# Patient Record
Sex: Female | Born: 1965
Health system: Southern US, Community
[De-identification: ages and names within clinical notes are randomized; demographics above are authoritative.]

## PROBLEM LIST (undated history)

## (undated) DIAGNOSIS — Z8719 Personal history of other diseases of the digestive system: Secondary | ICD-10-CM

## (undated) DIAGNOSIS — R42 Dizziness and giddiness: Secondary | ICD-10-CM

## (undated) DIAGNOSIS — K219 Gastro-esophageal reflux disease without esophagitis: Secondary | ICD-10-CM

## (undated) DIAGNOSIS — F410 Panic disorder [episodic paroxysmal anxiety] without agoraphobia: Secondary | ICD-10-CM

## (undated) DIAGNOSIS — M542 Cervicalgia: Secondary | ICD-10-CM

## (undated) DIAGNOSIS — G43909 Migraine, unspecified, not intractable, without status migrainosus: Secondary | ICD-10-CM

## (undated) DIAGNOSIS — K635 Polyp of colon: Secondary | ICD-10-CM

## (undated) HISTORY — PX: SHOULDER SURGERY: SHX246

## (undated) HISTORY — DX: Migraine, unspecified, not intractable, without status migrainosus: G43.909

## (undated) HISTORY — DX: Polyp of colon: K63.5

---

## 2000-04-07 ENCOUNTER — Other Ambulatory Visit: Admission: RE | Admit: 2000-04-07 | Discharge: 2000-04-07 | Payer: Self-pay | Admitting: Obstetrics and Gynecology

## 2001-05-25 ENCOUNTER — Other Ambulatory Visit: Admission: RE | Admit: 2001-05-25 | Discharge: 2001-05-25 | Payer: Self-pay | Admitting: Obstetrics and Gynecology

## 2011-12-09 HISTORY — PX: ABLATION ON ENDOMETRIOSIS: SHX5787

## 2016-06-20 ENCOUNTER — Ambulatory Visit: Payer: Self-pay | Admitting: Family Medicine

## 2016-06-20 ENCOUNTER — Ambulatory Visit (INDEPENDENT_AMBULATORY_CARE_PROVIDER_SITE_OTHER): Payer: Self-pay | Admitting: Family Medicine

## 2016-06-20 ENCOUNTER — Encounter: Payer: Self-pay | Admitting: Family Medicine

## 2016-06-20 VITALS — BP 140/90 | HR 80 | Ht 68.0 in | Wt 208.0 lb

## 2016-06-20 DIAGNOSIS — R030 Elevated blood-pressure reading, without diagnosis of hypertension: Secondary | ICD-10-CM

## 2016-06-20 DIAGNOSIS — J0100 Acute maxillary sinusitis, unspecified: Secondary | ICD-10-CM

## 2016-06-20 DIAGNOSIS — Z7689 Persons encountering health services in other specified circumstances: Secondary | ICD-10-CM

## 2016-06-20 DIAGNOSIS — Z7189 Other specified counseling: Secondary | ICD-10-CM

## 2016-06-20 DIAGNOSIS — G43109 Migraine with aura, not intractable, without status migrainosus: Secondary | ICD-10-CM

## 2016-06-20 MED ORDER — HYDROCHLOROTHIAZIDE 12.5 MG PO TABS: 12.5000 mg | ORAL_TABLET | Freq: Every day | ORAL | Status: DC

## 2016-06-20 MED ORDER — DOXYCYCLINE HYCLATE 100 MG PO TABS: 100.0000 mg | ORAL_TABLET | Freq: Two times a day (BID) | ORAL | Status: DC

## 2016-06-20 MED ORDER — TOPIRAMATE 50 MG PO TABS: 50.0000 mg | ORAL_TABLET | Freq: Every day | ORAL | Status: DC

## 2016-06-20 NOTE — Progress Notes (Signed)
Name: Yolanda Young   MRN: [id number]    DOB: Sep 27, 1966   Date:06/20/2016       Progress Note  Subjective  Chief Complaint  Chief Complaint  Patient presents with  . Establish Care    moved here from [place]  . Headache    averaging 2 headaches per week, last headache was about a week ago, until this one. Hurts above L) eye and into teeth. L) ear is popping- has a Hx of migraines/ use to take Topiramate 50mg  daily    Headache  This is a recurrent problem. The current episode started 1 to 4 weeks ago. The problem occurs intermittently. The problem has been waxing and waning. The pain is located in the left unilateral and retro-orbital region. The quality of the pain is described as aching. The pain is at a severity of 6/10. The pain is moderate. Associated symptoms include blurred vision, nausea, phonophobia, photophobia and sinus pressure. Pertinent negatives include no abdominal pain, back pain, coughing, dizziness, ear pain, fever, insomnia, loss of balance, neck pain, numbness, sore throat, tingling, visual change, weakness or weight loss. The symptoms are aggravated by weather changes. She has tried acetaminophen for the symptoms. Her past medical history is significant for migraine headaches.    No problem-specific assessment & plan notes found for this encounter.   Past Medical History  Diagnosis Date  . Migraine     Past Surgical History  Procedure Laterality Date  . Ablation on endometriosis    . Shoulder surgery Right     Family History  Problem Relation Age of Onset  . Diabetes Mother   . Stroke Father   . Cancer Maternal Grandmother   . Cancer Maternal Grandfather   . Diabetes Paternal Grandmother     Social History   Social History  . Marital Status: Legally Separated    Spouse Name: N/A  . Number of Children: N/A  . Years of Education: N/A   Occupational History  . Not on file.   Social History Main Topics  . Smoking status: Former Research scientist (life sciences)  .  Smokeless tobacco: Not on file  . Alcohol Use: 0.0 oz/week    0 Standard drinks or equivalent per week  . Drug Use: No  . Sexual Activity: Yes   Other Topics Concern  . Not on file   Social History Narrative  . No narrative on file    Allergies  Allergen Reactions  . Erythromycin   . Penicillins      Review of Systems  Constitutional: Negative for fever, chills, weight loss and malaise/fatigue.  HENT: Positive for sinus pressure. Negative for ear discharge, ear pain and sore throat.   Eyes: Positive for blurred vision and photophobia.  Respiratory: Negative for cough, sputum production, shortness of breath and wheezing.   Cardiovascular: Negative for chest pain, palpitations and leg swelling.  Gastrointestinal: Positive for nausea. Negative for heartburn, abdominal pain, diarrhea, constipation, blood in stool and melena.  Genitourinary: Negative for dysuria, urgency, frequency and hematuria.  Musculoskeletal: Negative for myalgias, back pain, joint pain and neck pain.  Skin: Negative for rash.  Neurological: Positive for headaches. Negative for dizziness, tingling, sensory change, focal weakness, weakness, numbness and loss of balance.  Endo/Heme/Allergies: Negative for environmental allergies and polydipsia. Does not bruise/bleed easily.  Psychiatric/Behavioral: Negative for depression and suicidal ideas. The patient is not nervous/anxious and does not have insomnia.      Objective  Filed Vitals:   06/20/16 1358  BP: 140/90  Pulse: 80  Height: 5\' 8"  (1.727 m)  Weight: 208 lb (94.348 kg)    Physical Exam  Constitutional: She is well-developed, well-nourished, and in no distress. No distress.  HENT:  Head: Normocephalic and atraumatic.  Right Ear: External ear normal.  Left Ear: External ear normal.  Nose: Nose normal.  Mouth/Throat: Oropharynx is clear and moist.  Eyes: Conjunctivae and EOM are normal. Pupils are equal, round, and reactive to light. Right eye  exhibits no discharge. Left eye exhibits no discharge.  Neck: Normal range of motion. Neck supple. No JVD present. No thyromegaly present.  Cardiovascular: Normal rate, regular rhythm, normal heart sounds and intact distal pulses.  Exam reveals no gallop and no friction rub.   No murmur heard. Pulmonary/Chest: Effort normal and breath sounds normal.  Abdominal: Soft. Bowel sounds are normal. She exhibits no mass. There is no tenderness. There is no guarding.  Musculoskeletal: Normal range of motion. She exhibits no edema.  Lymphadenopathy:    She has no cervical adenopathy.  Neurological: She is alert. She has normal sensation, normal strength, normal reflexes and intact cranial nerves.  Skin: Skin is warm and dry. She is not diaphoretic.  Psychiatric: Mood and affect normal.  Nursing note and vitals reviewed.     Assessment & Plan  Problem List Items Addressed This Visit    None    Visit Diagnoses    Encounter to establish care with new doctor    -  Primary    Elevated blood pressure (not hypertension)        Relevant Medications    hydrochlorothiazide (HYDRODIURIL) 12.5 MG tablet    Migraine with aura and without status migrainosus, not intractable        Relevant Medications    hydrochlorothiazide (HYDRODIURIL) 12.5 MG tablet    topiramate (TOPAMAX) 50 MG tablet    Acute maxillary sinusitis, recurrence not specified        Relevant Medications    doxycycline (VIBRA-TABS) 100 MG tablet         Dr. Flor Whitacre [facility]  06/20/2016

## 2016-11-13 ENCOUNTER — Ambulatory Visit (INDEPENDENT_AMBULATORY_CARE_PROVIDER_SITE_OTHER): Payer: Self-pay | Admitting: Family Medicine

## 2016-11-13 VITALS — BP 132/92 | HR 88 | Temp 97.6°F | Ht 68.0 in | Wt 213.0 lb

## 2016-11-13 DIAGNOSIS — J40 Bronchitis, not specified as acute or chronic: Secondary | ICD-10-CM

## 2016-11-13 DIAGNOSIS — J0100 Acute maxillary sinusitis, unspecified: Secondary | ICD-10-CM

## 2016-11-13 MED ORDER — GUAIFENESIN-CODEINE 100-10 MG/5ML PO SYRP: 5.0000 mL | ORAL_SOLUTION | Freq: Three times a day (TID) | ORAL | 0 refills | Status: DC | PRN

## 2016-11-13 MED ORDER — DOXYCYCLINE HYCLATE 100 MG PO TABS: 100.0000 mg | ORAL_TABLET | Freq: Two times a day (BID) | ORAL | 0 refills | Status: DC

## 2016-11-13 NOTE — Progress Notes (Signed)
Name: Yolanda Young   MRN: [id number]    DOB: October 31, 1966   Date:11/13/2016       Progress Note  Subjective  Chief Complaint  Chief Complaint  Patient presents with  . Cough    Pt stated having dry cough, sinus pressure for about 2 weeks.    Cough  This is a new problem. The current episode started 1 to 4 weeks ago. The problem has been waxing and waning. The problem occurs hourly. The cough is non-productive. Associated symptoms include chills, ear congestion, headaches, postnasal drip and shortness of breath. Pertinent negatives include no chest pain, ear pain, fever, heartburn, hemoptysis, myalgias, nasal congestion, rash, rhinorrhea, sore throat, sweats, weight loss or wheezing. Nothing aggravates the symptoms. She has tried OTC cough suppressant for the symptoms. The treatment provided mild relief. There is no history of asthma, bronchiectasis, COPD, emphysema, environmental allergies or pneumonia.  Sinusitis  This is a new problem. The current episode started 1 to 4 weeks ago. The problem has been gradually worsening since onset. There has been no fever. The pain is mild. Associated symptoms include chills, coughing, headaches, shortness of breath and sinus pressure. Pertinent negatives include no ear pain, neck pain or sore throat.    No problem-specific Assessment & Plan notes found for this encounter.   Past Medical History:  Diagnosis Date  . Migraine     Past Surgical History:  Procedure Laterality Date  . ABLATION ON ENDOMETRIOSIS    . SHOULDER SURGERY Right     Family History  Problem Relation Age of Onset  . Diabetes Mother   . Stroke Father   . Cancer Maternal Grandmother   . Cancer Maternal Grandfather   . Diabetes Paternal Grandmother     Social History   Social History  . Marital status: Legally Separated    Spouse name: N/A  . Number of children: N/A  . Years of education: N/A   Occupational History  . Not on file.   Social History Main Topics  .  Smoking status: Former Research scientist (life sciences)  . Smokeless tobacco: Not on file  . Alcohol use 0.0 oz/week  . Drug use: No  . Sexual activity: Yes   Other Topics Concern  . Not on file   Social History Narrative  . No narrative on file    Allergies  Allergen Reactions  . Erythromycin   . Penicillins      Review of Systems  Constitutional: Positive for chills. Negative for fever, malaise/fatigue and weight loss.  HENT: Positive for postnasal drip and sinus pressure. Negative for ear discharge, ear pain, rhinorrhea and sore throat.   Eyes: Negative for blurred vision.  Respiratory: Positive for cough and shortness of breath. Negative for hemoptysis, sputum production and wheezing.   Cardiovascular: Negative for chest pain, palpitations and leg swelling.  Gastrointestinal: Negative for abdominal pain, blood in stool, constipation, diarrhea, heartburn, melena and nausea.  Genitourinary: Negative for dysuria, frequency, hematuria and urgency.  Musculoskeletal: Negative for back pain, joint pain, myalgias and neck pain.  Skin: Negative for rash.  Neurological: Positive for headaches. Negative for dizziness, tingling, sensory change and focal weakness.  Endo/Heme/Allergies: Negative for environmental allergies and polydipsia. Does not bruise/bleed easily.  Psychiatric/Behavioral: Negative for depression and suicidal ideas. The patient is not nervous/anxious and does not have insomnia.      Objective  Vitals:   11/13/16 0807  BP: (!) 132/92  Pulse: 88  Temp: 97.6 F (36.4 C)  SpO2: 96%  Weight: 213 lb (  96.6 kg)  Height: 5\' 8"  (1.727 m)    Physical Exam  Constitutional: She is well-developed, well-nourished, and in no distress. No distress.  HENT:  Head: Normocephalic and atraumatic.  Right Ear: Tympanic membrane, external ear and ear canal normal.  Left Ear: Tympanic membrane, external ear and ear canal normal.  Nose: Nose normal.  Mouth/Throat: Oropharynx is clear and moist.  Eyes:  Conjunctivae and EOM are normal. Pupils are equal, round, and reactive to light. Right eye exhibits no discharge. Left eye exhibits no discharge.  Neck: Normal range of motion. Neck supple. No JVD present. No thyromegaly present.  Cardiovascular: Normal rate, regular rhythm, normal heart sounds and intact distal pulses.  Exam reveals no gallop and no friction rub.   No murmur heard. Pulmonary/Chest: Effort normal and breath sounds normal. She has no wheezes. She has no rales.  Abdominal: Soft. Bowel sounds are normal. She exhibits no mass. There is no tenderness. There is no guarding.  Musculoskeletal: Normal range of motion. She exhibits no edema.  Lymphadenopathy:    She has no cervical adenopathy.  Neurological: She is alert. She has normal reflexes.  Skin: Skin is warm and dry. No rash noted. She is not diaphoretic.  Psychiatric: Mood and affect normal.  Nursing note and vitals reviewed.     Assessment & Plan  Problem List Items Addressed This Visit    None    Visit Diagnoses    Bronchitis    -  Primary   Relevant Medications   doxycycline (VIBRA-TABS) 100 MG tablet   guaiFENesin-codeine (ROBITUSSIN AC) 100-10 MG/5ML syrup   Acute maxillary sinusitis, recurrence not specified       Relevant Medications   doxycycline (VIBRA-TABS) 100 MG tablet   guaiFENesin-codeine (ROBITUSSIN AC) 100-10 MG/5ML syrup        Dr. Charolette Bultman [facility]  11/13/16

## 2016-12-23 ENCOUNTER — Ambulatory Visit (INDEPENDENT_AMBULATORY_CARE_PROVIDER_SITE_OTHER): Payer: BLUE CROSS/BLUE SHIELD | Admitting: Family Medicine

## 2016-12-23 ENCOUNTER — Encounter: Payer: Self-pay | Admitting: Family Medicine

## 2016-12-23 VITALS — BP 110/80 | HR 70 | Ht 68.0 in | Wt 215.0 lb

## 2016-12-23 DIAGNOSIS — R0789 Other chest pain: Secondary | ICD-10-CM | POA: Diagnosis not present

## 2016-12-23 DIAGNOSIS — Z1231 Encounter for screening mammogram for malignant neoplasm of breast: Secondary | ICD-10-CM | POA: Diagnosis not present

## 2016-12-23 DIAGNOSIS — Z1239 Encounter for other screening for malignant neoplasm of breast: Secondary | ICD-10-CM

## 2016-12-23 MED ORDER — PREDNISONE 10 MG PO TABS: 10.0000 mg | ORAL_TABLET | Freq: Every day | ORAL | 0 refills | Status: DC

## 2016-12-23 MED ORDER — TRAMADOL HCL 50 MG PO TABS: 50.0000 mg | ORAL_TABLET | Freq: Three times a day (TID) | ORAL | 0 refills | Status: DC | PRN

## 2016-12-23 MED ORDER — VALACYCLOVIR HCL 1 G PO TABS: 1000.0000 mg | ORAL_TABLET | Freq: Two times a day (BID) | ORAL | 0 refills | Status: DC

## 2016-12-23 NOTE — Progress Notes (Signed)
Name: Yolanda Young   MRN: [id number]    DOB: 1966/10/16   Date:12/23/2016       Progress Note  Subjective  Chief Complaint  Chief Complaint  Patient presents with  . Breast Pain    R) breast pain- feels like a sunburn/ redness on outside of R) breast- came up yesterday. Noticed a "pimlpe" on R) nipple x 2 months ago- clear liquid came out    Right breast 2 months ago "pimple " arose on nipple erythematous,swelling, and tender/ clear discharge with resolution.  No blood or brown/  Onset of "burning" of right lateral breast and redness.    No problem-specific Assessment & Plan notes found for this encounter.   Past Medical History:  Diagnosis Date  . Migraine     Past Surgical History:  Procedure Laterality Date  . ABLATION ON ENDOMETRIOSIS    . SHOULDER SURGERY Right     Family History  Problem Relation Age of Onset  . Diabetes Mother   . Stroke Father   . Cancer Maternal Grandmother   . Cancer Maternal Grandfather   . Diabetes Paternal Grandmother     Social History   Social History  . Marital status: Legally Separated    Spouse name: N/A  . Number of children: N/A  . Years of education: N/A   Occupational History  . Not on file.   Social History Main Topics  . Smoking status: Former Research scientist (life sciences)  . Smokeless tobacco: Not on file  . Alcohol use 0.0 oz/week  . Drug use: No  . Sexual activity: Yes   Other Topics Concern  . Not on file   Social History Narrative  . No narrative on file    Allergies  Allergen Reactions  . Erythromycin   . Penicillins      Review of Systems  Constitutional: Negative for chills, fever, malaise/fatigue and weight loss.  HENT: Negative for ear discharge, ear pain and sore throat.   Eyes: Negative for blurred vision.  Respiratory: Negative for cough, sputum production, shortness of breath and wheezing.   Cardiovascular: Negative for chest pain, palpitations and leg swelling.  Gastrointestinal: Negative for abdominal pain,  blood in stool, constipation, diarrhea, heartburn, melena and nausea.  Genitourinary: Negative for dysuria, frequency, hematuria and urgency.  Musculoskeletal: Negative for back pain, joint pain, myalgias and neck pain.  Skin: Negative for rash.  Neurological: Negative for dizziness, tingling, sensory change, focal weakness and headaches.  Endo/Heme/Allergies: Negative for environmental allergies and polydipsia. Does not bruise/bleed easily.  Psychiatric/Behavioral: Negative for depression and suicidal ideas. The patient is not nervous/anxious and does not have insomnia.      Objective  Vitals:   12/23/16 1115  BP: 110/80  Pulse: 70  Weight: 215 lb (97.5 kg)  Height: 5\' 8"  (1.727 m)    Physical Exam  Constitutional: She is well-developed, well-nourished, and in no distress. No distress.  HENT:  Head: Normocephalic and atraumatic.  Right Ear: External ear normal.  Left Ear: External ear normal.  Nose: Nose normal.  Mouth/Throat: Oropharynx is clear and moist.  Eyes: Conjunctivae and EOM are normal. Pupils are equal, round, and reactive to light. Right eye exhibits no discharge. Left eye exhibits no discharge.  Neck: Normal range of motion. Neck supple. No JVD present. No thyromegaly present.  Cardiovascular: Normal rate, regular rhythm, normal heart sounds and intact distal pulses.  Exam reveals no gallop and no friction rub.   No murmur heard. Pulmonary/Chest: Effort normal and breath sounds normal. She  has no wheezes. She has no rales.  Abdominal: Soft. Bowel sounds are normal. She exhibits no mass. There is no tenderness. There is no guarding.  Musculoskeletal: Normal range of motion. She exhibits no edema.  Lymphadenopathy:    She has no cervical adenopathy.  Neurological: She is alert.  Skin: Skin is warm and dry. She is not diaphoretic.  Psychiatric: Mood and affect normal.      Assessment & Plan  Problem List Items Addressed This Visit    None    Visit Diagnoses     Encounter for screening breast examination    -  Primary   Relevant Orders   MM Digital Screening   Chest wall discomfort       Relevant Medications   valACYclovir (VALTREX) 1000 MG tablet   traMADol (ULTRAM) 50 MG tablet   predniSONE (DELTASONE) 10 MG tablet        Dr. Jidenna Figgs [facility]  12/23/16

## 2016-12-30 ENCOUNTER — Other Ambulatory Visit: Payer: Self-pay | Admitting: *Deleted

## 2016-12-30 ENCOUNTER — Inpatient Hospital Stay
Admission: RE | Admit: 2016-12-30 | Discharge: 2016-12-30 | Disposition: A | Discharge status: Home / Self Care | Payer: Self-pay | Source: Ambulatory Visit | Attending: *Deleted | Admitting: *Deleted

## 2016-12-30 DIAGNOSIS — Z9289 Personal history of other medical treatment: Secondary | ICD-10-CM

## 2017-01-21 ENCOUNTER — Ambulatory Visit
Admission: RE | Admit: 2017-01-21 | Discharge: 2017-01-21 | Disposition: A | Discharge status: Home / Self Care | Payer: BLUE CROSS/BLUE SHIELD | Source: Ambulatory Visit | Attending: Family Medicine | Admitting: Family Medicine

## 2017-01-21 DIAGNOSIS — Z1231 Encounter for screening mammogram for malignant neoplasm of breast: Secondary | ICD-10-CM | POA: Diagnosis not present

## 2017-01-21 DIAGNOSIS — Z1239 Encounter for other screening for malignant neoplasm of breast: Secondary | ICD-10-CM

## 2017-04-10 ENCOUNTER — Encounter: Payer: Self-pay | Admitting: Family Medicine

## 2017-04-10 ENCOUNTER — Ambulatory Visit (INDEPENDENT_AMBULATORY_CARE_PROVIDER_SITE_OTHER): Payer: BLUE CROSS/BLUE SHIELD | Admitting: Family Medicine

## 2017-04-10 VITALS — BP 120/80 | HR 64 | Ht 68.0 in | Wt 208.0 lb

## 2017-04-10 DIAGNOSIS — Z0289 Encounter for other administrative examinations: Secondary | ICD-10-CM

## 2017-04-10 DIAGNOSIS — Z021 Encounter for pre-employment examination: Secondary | ICD-10-CM | POA: Diagnosis not present

## 2017-04-10 DIAGNOSIS — J0100 Acute maxillary sinusitis, unspecified: Secondary | ICD-10-CM

## 2017-04-10 DIAGNOSIS — Z111 Encounter for screening for respiratory tuberculosis: Secondary | ICD-10-CM

## 2017-04-10 MED ORDER — DOXYCYCLINE HYCLATE 100 MG PO TABS: 100.0000 mg | ORAL_TABLET | Freq: Two times a day (BID) | ORAL | 0 refills | Status: DC

## 2017-04-10 NOTE — Progress Notes (Signed)
Name: Yolanda Young   MRN: [id number]    DOB: 31-Dec-1965   Date:04/10/2017       Progress Note  Subjective  Chief Complaint  Chief Complaint  Patient presents with  . Annual Exam    will come on Mon for TB skin test    Patient presents for school employment physical.   Sinusitis  This is a new problem. The current episode started in the past 7 days. The problem has been gradually worsening since onset. There has been no fever. The pain is mild. Associated symptoms include chills, congestion, coughing, diaphoresis, ear pain, headaches, sinus pressure, sneezing, a sore throat and swollen glands. Pertinent negatives include no hoarse voice, neck pain or shortness of breath. Past treatments include oral decongestants. The treatment provided moderate relief.    No problem-specific Assessment & Plan notes found for this encounter.   Past Medical History:  Diagnosis Date  . Migraine     Past Surgical History:  Procedure Laterality Date  . ABLATION ON ENDOMETRIOSIS    . SHOULDER SURGERY Right     Family History  Problem Relation Age of Onset  . Diabetes Mother   . Stroke Father   . Cancer Maternal Grandmother   . Cancer Maternal Grandfather   . Diabetes Paternal Grandmother     Social History   Social History  . Marital status: Divorced    Spouse name: N/A  . Number of children: N/A  . Years of education: N/A   Occupational History  . Not on file.   Social History Main Topics  . Smoking status: Former Research scientist (life sciences)  . Smokeless tobacco: Never Used  . Alcohol use 0.0 oz/week  . Drug use: No  . Sexual activity: Yes   Other Topics Concern  . Not on file   Social History Narrative  . No narrative on file    Allergies  Allergen Reactions  . Erythromycin   . Penicillins     Outpatient Medications Prior to Visit  Medication Sig Dispense Refill  . topiramate (TOPAMAX) 50 MG tablet Take 1 tablet (50 mg total) by mouth daily. 30 tablet 6  . traMADol (ULTRAM) 50 MG tablet  Take 1 tablet (50 mg total) by mouth every 8 (eight) hours as needed. 30 tablet 0  . predniSONE (DELTASONE) 10 MG tablet Take 1 tablet (10 mg total) by mouth daily with breakfast. 30 tablet 0  . valACYclovir (VALTREX) 1000 MG tablet Take 1 tablet (1,000 mg total) by mouth 2 (two) times daily. 20 tablet 0   No facility-administered medications prior to visit.     Review of Systems  Constitutional: Positive for chills and diaphoresis. Negative for fever, malaise/fatigue and weight loss.  HENT: Positive for congestion, ear pain, sinus pressure, sneezing and sore throat. Negative for ear discharge and hoarse voice.   Eyes: Negative for blurred vision.  Respiratory: Positive for cough. Negative for sputum production, shortness of breath and wheezing.   Cardiovascular: Negative for chest pain, palpitations and leg swelling.  Gastrointestinal: Negative for abdominal pain, blood in stool, constipation, diarrhea, heartburn, melena and nausea.  Genitourinary: Negative for dysuria, frequency, hematuria and urgency.  Musculoskeletal: Negative for back pain, joint pain, myalgias and neck pain.  Skin: Negative for rash.  Neurological: Positive for headaches. Negative for dizziness, tingling, sensory change and focal weakness.  Endo/Heme/Allergies: Negative for environmental allergies and polydipsia. Does not bruise/bleed easily.  Psychiatric/Behavioral: Negative for depression and suicidal ideas. The patient is not nervous/anxious and does not have insomnia.  Objective  Vitals:   04/10/17 0946  BP: 120/80  Pulse: 64  Weight: 208 lb (94.3 kg)  Height: 5\' 8"  (1.727 m)    Physical Exam  Constitutional: She is well-developed, well-nourished, and in no distress. No distress.  HENT:  Head: Normocephalic and atraumatic.  Right Ear: External ear normal. Tympanic membrane is retracted.  Left Ear: External ear normal. Tympanic membrane is retracted.  Nose: Right sinus exhibits maxillary sinus  tenderness. Left sinus exhibits maxillary sinus tenderness.  Mouth/Throat: Posterior oropharyngeal erythema present.  Eyes: Conjunctivae and EOM are normal. Pupils are equal, round, and reactive to light. Right eye exhibits no discharge. Left eye exhibits no discharge.  Neck: Normal range of motion. Neck supple. No JVD present. No thyromegaly present.  Cardiovascular: Normal rate, regular rhythm, normal heart sounds and intact distal pulses.  Exam reveals no gallop and no friction rub.   No murmur heard. Pulmonary/Chest: Effort normal and breath sounds normal. She has no wheezes.  Abdominal: Soft. Bowel sounds are normal. She exhibits no mass. There is no tenderness. There is no guarding.  Musculoskeletal: Normal range of motion. She exhibits no edema.  Lymphadenopathy:    She has no cervical adenopathy.  Neurological: She is alert. She has normal reflexes.  Skin: Skin is warm and dry. She is not diaphoretic.  Psychiatric: Mood and affect normal.  Nursing note and vitals reviewed.     Assessment & Plan  Problem List Items Addressed This Visit    None    Visit Diagnoses    Encounter for physical examination related to employment    -  Primary   Relevant Orders   PPD (Completed)   Acute maxillary sinusitis, recurrence not specified       Relevant Medications   doxycycline (VIBRA-TABS) 100 MG tablet   Screening-pulmonary TB       Relevant Orders   PPD (Completed)      Meds ordered this encounter  Medications  . doxycycline (VIBRA-TABS) 100 MG tablet    Sig: Take 1 tablet (100 mg total) by mouth 2 (two) times daily.    Dispense:  20 tablet    Refill:  0      Dr. Dalan Cowger [facility]  04/10/17

## 2017-04-13 LAB — TB SKIN TEST
INDURATION: 0 mm
TB SKIN TEST: NEGATIVE

## 2017-06-22 ENCOUNTER — Other Ambulatory Visit: Payer: Self-pay

## 2017-06-22 DIAGNOSIS — G43109 Migraine with aura, not intractable, without status migrainosus: Secondary | ICD-10-CM

## 2017-06-22 MED ORDER — TOPIRAMATE 50 MG PO TABS
50.0000 mg | ORAL_TABLET | Freq: Every day | ORAL | 2 refills | Status: DC
Start: 2017-06-22 — End: 2017-09-18

## 2017-09-18 ENCOUNTER — Ambulatory Visit
Admission: RE | Admit: 2017-09-18 | Discharge: 2017-09-18 | Disposition: A | Discharge status: Home / Self Care | Payer: BLUE CROSS/BLUE SHIELD | Source: Ambulatory Visit | Attending: Family Medicine | Admitting: Family Medicine

## 2017-09-18 ENCOUNTER — Ambulatory Visit (INDEPENDENT_AMBULATORY_CARE_PROVIDER_SITE_OTHER): Payer: BLUE CROSS/BLUE SHIELD | Admitting: Family Medicine

## 2017-09-18 ENCOUNTER — Encounter: Payer: Self-pay | Admitting: Family Medicine

## 2017-09-18 VITALS — BP 120/80 | HR 68 | Ht 68.0 in | Wt 218.0 lb

## 2017-09-18 DIAGNOSIS — R319 Hematuria, unspecified: Secondary | ICD-10-CM

## 2017-09-18 DIAGNOSIS — R109 Unspecified abdominal pain: Secondary | ICD-10-CM | POA: Diagnosis not present

## 2017-09-18 DIAGNOSIS — M5126 Other intervertebral disc displacement, lumbar region: Secondary | ICD-10-CM | POA: Diagnosis not present

## 2017-09-18 DIAGNOSIS — M790 Rheumatism, unspecified: Secondary | ICD-10-CM

## 2017-09-18 DIAGNOSIS — R1032 Left lower quadrant pain: Secondary | ICD-10-CM | POA: Diagnosis not present

## 2017-09-18 DIAGNOSIS — G43109 Migraine with aura, not intractable, without status migrainosus: Secondary | ICD-10-CM

## 2017-09-18 DIAGNOSIS — W19XXXA Unspecified fall, initial encounter: Secondary | ICD-10-CM | POA: Insufficient documentation

## 2017-09-18 LAB — POCT URINALYSIS DIPSTICK
BILIRUBIN UA: NEGATIVE
GLUCOSE UA: NEGATIVE
Ketones, UA: NEGATIVE
Leukocytes, UA: NEGATIVE
Nitrite, UA: NEGATIVE
Protein, UA: NEGATIVE
SPEC GRAV UA: 1.01 (ref 1.010–1.025)
UROBILINOGEN UA: 0.2 U/dL
pH, UA: 6 (ref 5.0–8.0)

## 2017-09-18 MED ORDER — PREDNISONE 10 MG PO TABS: 10.0000 mg | ORAL_TABLET | Freq: Every day | ORAL | 0 refills | Status: DC

## 2017-09-18 MED ORDER — CYCLOBENZAPRINE HCL 10 MG PO TABS: 10.0000 mg | ORAL_TABLET | Freq: Three times a day (TID) | ORAL | 0 refills | Status: DC | PRN

## 2017-09-18 MED ORDER — TOPIRAMATE 50 MG PO TABS: 50.0000 mg | ORAL_TABLET | Freq: Every day | ORAL | 2 refills | Status: DC

## 2017-09-18 MED ORDER — TRAMADOL HCL 50 MG PO TABS: 50.0000 mg | ORAL_TABLET | Freq: Four times a day (QID) | ORAL | 0 refills | Status: DC | PRN

## 2017-09-18 NOTE — Progress Notes (Signed)
Name: Yolanda Young   MRN: [id number]    DOB: 1966/04/07   Date:09/18/2017       Progress Note  Subjective  Chief Complaint  Chief Complaint  Patient presents with  . Back Pain    fell x 1 week ago- pain is "excrusiating, like contractions that radiates around to my groin"- some blood in urine.     Back Pain  This is a new problem. The current episode started in the past 7 days. The problem occurs constantly. The problem has been gradually worsening since onset. The pain is present in the lumbar spine. The quality of the pain is described as aching. Radiates to: suprapubic. The pain is at a severity of 10/10. The pain is severe. The pain is the same all the time. The symptoms are aggravated by bending, twisting and sitting. Pertinent negatives include no abdominal pain, bladder incontinence, bowel incontinence, chest pain, dysuria, fever, headaches, leg pain, numbness, paresis, paresthesias, tingling, weakness or weight loss. Risk factors: hx of stones. The treatment provided moderate relief.  Fall  The accident occurred more than 1 week ago. The fall occurred while standing. She landed on hard floor. The point of impact was the buttocks. The pain is present in the back. The pain is at a severity of 10/10. The pain is severe. The symptoms are aggravated by ambulation, rotation, sitting and movement. Pertinent negatives include no abdominal pain, bowel incontinence, fever, headaches, hematuria, nausea, numbness or tingling. She has tried acetaminophen (tramadol) for the symptoms.    No problem-specific Assessment & Plan notes found for this encounter.   Past Medical History:  Diagnosis Date  . Migraine     Past Surgical History:  Procedure Laterality Date  . ABLATION ON ENDOMETRIOSIS    . SHOULDER SURGERY Right     Family History  Problem Relation Age of Onset  . Diabetes Mother   . Stroke Father   . Cancer Maternal Grandmother   . Cancer Maternal Grandfather   . Diabetes Paternal  Grandmother     Social History   Social History  . Marital status: Divorced    Spouse name: N/A  . Number of children: N/A  . Years of education: N/A   Occupational History  . Not on file.   Social History Main Topics  . Smoking status: Former Research scientist (life sciences)  . Smokeless tobacco: Never Used  . Alcohol use 0.0 oz/week  . Drug use: No  . Sexual activity: Yes   Other Topics Concern  . Not on file   Social History Narrative  . No narrative on file    Allergies  Allergen Reactions  . Erythromycin   . Penicillins     Outpatient Medications Prior to Visit  Medication Sig Dispense Refill  . traMADol (ULTRAM) 50 MG tablet Take 1 tablet (50 mg total) by mouth every 8 (eight) hours as needed. 30 tablet 0  . doxycycline (VIBRA-TABS) 100 MG tablet Take 1 tablet (100 mg total) by mouth 2 (two) times daily. 20 tablet 0  . topiramate (TOPAMAX) 50 MG tablet Take 1 tablet (50 mg total) by mouth daily. (Patient not taking: Reported on 09/18/2017) 30 tablet 2   No facility-administered medications prior to visit.     Review of Systems  Constitutional: Negative for chills, fever, malaise/fatigue and weight loss.  HENT: Negative for ear discharge, ear pain and sore throat.   Eyes: Negative for blurred vision.  Respiratory: Negative for cough, sputum production, shortness of breath and wheezing.   Cardiovascular:  Negative for chest pain, palpitations and leg swelling.  Gastrointestinal: Negative for abdominal pain, blood in stool, bowel incontinence, constipation, diarrhea, heartburn, melena and nausea.  Genitourinary: Negative for bladder incontinence, dysuria, frequency, hematuria and urgency.  Musculoskeletal: Positive for back pain. Negative for joint pain, myalgias and neck pain.  Skin: Negative for rash.  Neurological: Negative for dizziness, tingling, sensory change, focal weakness, weakness, numbness, headaches and paresthesias.  Endo/Heme/Allergies: Negative for environmental  allergies and polydipsia. Does not bruise/bleed easily.  Psychiatric/Behavioral: Negative for depression and suicidal ideas. The patient is not nervous/anxious and does not have insomnia.      Objective  Vitals:   09/18/17 0934  BP: 120/80  Pulse: 68  Weight: 218 lb (98.9 kg)  Height: 5\' 8"  (1.727 m)    Physical Exam  Constitutional: She is well-developed, well-nourished, and in no distress. No distress.  HENT:  Head: Normocephalic and atraumatic.  Right Ear: External ear normal.  Left Ear: External ear normal.  Nose: Nose normal.  Mouth/Throat: Oropharynx is clear and moist.  Eyes: Pupils are equal, round, and reactive to light. Conjunctivae and EOM are normal. Right eye exhibits no discharge. Left eye exhibits no discharge.  Neck: Normal range of motion. Neck supple. No JVD present. No thyromegaly present.  Cardiovascular: Normal rate, regular rhythm, normal heart sounds and intact distal pulses.  Exam reveals no gallop and no friction rub.   No murmur heard. Pulmonary/Chest: Effort normal and breath sounds normal. She has no wheezes. She has no rales.  Abdominal: Soft. Bowel sounds are normal. She exhibits no mass. There is no tenderness. There is no guarding.  Musculoskeletal: She exhibits no edema.       Lumbar back: She exhibits decreased range of motion and spasm. She exhibits no bony tenderness.       Left hand: She exhibits deformity.  boutenierre left little finger  Lymphadenopathy:    She has no cervical adenopathy.  Neurological: She is alert. She has normal sensation, normal strength and normal reflexes. She has an abnormal Straight Leg Raise Test.  Skin: Skin is warm and dry. She is not diaphoretic.  Psychiatric: Mood and affect normal.  Nursing note and vitals reviewed.     Assessment & Plan  Problem List Items Addressed This Visit    None    Visit Diagnoses    Lumbar herniated disc    -  Primary   Relevant Medications   traMADol (ULTRAM) 50 MG tablet    cyclobenzaprine (FLEXERIL) 10 MG tablet   predniSONE (DELTASONE) 10 MG tablet   Other Relevant Orders   POCT urinalysis dipstick (Completed)   DG Lumbar Spine Complete (Completed)   Fall, initial encounter       Relevant Medications   traMADol (ULTRAM) 50 MG tablet   cyclobenzaprine (FLEXERIL) 10 MG tablet   predniSONE (DELTASONE) 10 MG tablet   Other Relevant Orders   POCT urinalysis dipstick (Completed)   DG Lumbar Spine Complete (Completed)   Hematuria, unspecified type       Relevant Orders   CT Abdomen Pelvis W Contrast   Radiating abdominal pain       Relevant Orders   CT Abdomen Pelvis W Contrast   Left lower quadrant pain       Relevant Orders   CT Abdomen Pelvis W Contrast   Migraine with aura and without status migrainosus, not intractable       Relevant Medications   traMADol (ULTRAM) 50 MG tablet   cyclobenzaprine (FLEXERIL) 10 MG  tablet   topiramate (TOPAMAX) 50 MG tablet   Rheumatism       release of info      Meds ordered this encounter  Medications  . traMADol (ULTRAM) 50 MG tablet    Sig: Take 1 tablet (50 mg total) by mouth every 6 (six) hours as needed.    Dispense:  20 tablet    Refill:  0  . cyclobenzaprine (FLEXERIL) 10 MG tablet    Sig: Take 1 tablet (10 mg total) by mouth 3 (three) times daily as needed for muscle spasms.    Dispense:  30 tablet    Refill:  0  . predniSONE (DELTASONE) 10 MG tablet    Sig: Take 1 tablet (10 mg total) by mouth daily with breakfast.    Dispense:  42 tablet    Refill:  0    Taper 6,6,5,5,4,4,3,3,2,2,1,1  . topiramate (TOPAMAX) 50 MG tablet    Sig: Take 1 tablet (50 mg total) by mouth daily.    Dispense:  30 tablet    Refill:  2  I spent 40 minutes with this patient, More than 50% of that time was spent in face to face education, counseling and care coordination.    Dr. [facility]  09/18/17

## 2017-09-18 NOTE — Patient Instructions (Signed)
Herniated Disk A herniated disk, also called a ruptured disk or slipped disk, occurs when a disk in the spine bulges out too far. Between the bones in the spine (vertebrae), there are oval disks that are made of a soft, spongy center that is surrounded by a tough outer ring. The disks connect your vertebrae, help your spine move, and absorb shocks from your movement. When you have a herniated disk, the spongy center of the disk bulges out or breaks through the outer ring. It can press on a nerve between the vertebrae and cause pain. This can occur anywhere in the back or neck area, but the lower back is most commonly affected. What are the causes? This condition may be caused by:  Age-related wear and tear. The spongy centers of spinal disks tend to shrink and dry out with age, which makes them more likely to herniate.  Sudden injury, such as a strain or sprain.  What increases the risk? Aging is the main risk factor for a herniated disk. Other risk factors include:  Being a man who is 30-50 years old.  Frequently doing activities that involve heavy lifting, bending, or twisting.  Frequently driving for long hours at a time.  Not getting enough exercise.  Being overweight.  Smoking.  Having a family history of back problems or herniated disks.  Being pregnant or giving birth.  Having poor nutrition.  Being tall.  What are the signs or symptoms? Symptoms may vary depending on where your herniated disk is located.  A herniated disk in the lower back may cause sharp pain in: ? Part of the arm, leg, hip, or buttocks. ? The back of the lower leg (calf). ? The lower back, spreading down through the leg into the foot (sciatica).  A herniated disk in the neck may cause dizziness and vertigo. It may also cause pain or weakness in: ? The neck. ? The shoulder blades. ? Upper arm, forearm, or fingers.  You may also have muscle weakness. It may be difficult to: ? Lift your leg or  arm. ? Stand on your toes. ? Squeeze tightly with one of your hands.  Other symptoms may include: ? Numbness or tingling in the affected areas of the hands, arms, feet, or legs. ? Inability to control when you urinate or when you have bowel movements. This is a rare but serious sign of a severe herniated disk in the lower back.  How is this diagnosed? This condition may be diagnosed based on:  Your symptoms.  Your medical history.  A physical exam. The exam may include: ? Straight-leg test. You will lie on your back while your health care provider lifts your leg, keeping your knee straight. If you feel pain, you likely have a herniated disk. ? Neurological tests. This includes checking for numbness, reflexes, muscle strength, and posture.  Imaging tests, such as: ? X-rays. ? MRI. ? CT scan. ? Electromyogram (EMG) to check the nerves that control muscles. This test may be used to determine which nerves are affected by your herniated disk.  How is this treated? Treatment for this condition may include:  A short period of rest. This is usually the first treatment. ? You may be on bed rest for up to 2 days, or you may be instructed to stay home and avoid physical activity. ? If you have a herniated disk in your lower back, avoid sitting as much as possible. Sitting increases pressure on the disk.  Medicines. These may   include: ? NSAIDs to help reduce pain and swelling. ? Muscle relaxants to prevent sudden tightening of the back muscles (back spasms). ? Prescription pain medicines, if you have severe pain.  Steroid injections in the area of the herniated disk. This can help reduce pain and swelling.  Physical therapy to strengthen your back muscles.  In many cases, symptoms go away with treatment over a period of days or weeks. You will most likely be free of symptoms after 3-4 months. If other treatments do not help to relieve your symptoms, you may need surgery. Follow these  instructions at home: Medicines  Take over-the-counter and prescription medicines only as told by your health care provider.  Do not drive or use heavy machinery while taking prescription pain medicine. Activity  Rest as directed.  After your rest period: ? Return to your normal activities and gradually begin exercising as told by your health care provider. Ask your health care provider what activities and exercises are safe for you. ? Use good posture. ? Avoid movements that cause pain. ? Do not lift anything that is heavier than 10 lb (4.5 kg) until your health care provider says this is safe. ? Do not sit or stand for long periods of time without changing positions. ? Do not sit for long periods of time without getting up and moving around.  If physical therapy was prescribed, do exercises as instructed.  Aim to strengthen muscles in your back and abdomen with exercises like crunches, swimming, or walking. General instructions  Do not use any products that contain nicotine or tobacco, such as cigarettes and e-cigarettes. These products can delay healing. If you need help quitting, ask your health care provider.  Do not wear high-heeled shoes.  Do not sleep on your belly.  If you are overweight, work with your health care provider to lose weight safely.  To prevent or treat constipation while you are taking prescription pain medicine, your health care provider may recommend that you: ? Drink enough fluid to keep your urine clear or pale yellow. ? Take over-the-counter or prescription medicines. ? Eat foods that are high in fiber, such as fresh fruits and vegetables, whole grains, and beans. ? Limit foods that are high in fat and processed sugars, such as fried and sweet foods.  Keep all follow-up visits as told by your health care provider. This is important. How is this prevented?  Maintain a healthy weight.  Try to avoid stressful situations.  Maintain physical  fitness. Do at least 150 minutes of moderate-intensity exercise each week, such as brisk walking or water aerobics.  When lifting objects: ? Keep your feet at least shoulder-width apart and tighten your abdominal muscles. ? Keep your spine neutral as you bend your knees and hips. It is important to lift using the strength of your legs, not your back. Do not lock your knees straight out. ? Always ask for help to lift heavy or awkward objects. Contact a health care provider if:  You have back pain or neck pain that does not get better after 6 weeks.  You have severe pain in your back, neck, legs, or arms.  You develop numbness, tingling, or weakness in any part of your body. Get help right away if:  You cannot move your arms or legs.  You cannot control when you urinate or have bowel movements.  You feel dizzy or you faint.  You have shortness of breath. This information is not intended to replace advice given   to you by your health care provider. Make sure you discuss any questions you have with your health care provider. Document Released: 11/21/2000 Document Revised: 07/21/2016 Document Reviewed: 07/21/2016 Elsevier Interactive Patient Education  2017 [employer]  

## 2017-09-24 ENCOUNTER — Ambulatory Visit
Admission: RE | Admit: 2017-09-24 | Discharge: 2017-09-24 | Disposition: A | Discharge status: Home / Self Care | Payer: BLUE CROSS/BLUE SHIELD | Source: Ambulatory Visit | Attending: Family Medicine | Admitting: Family Medicine

## 2017-09-24 DIAGNOSIS — R109 Unspecified abdominal pain: Secondary | ICD-10-CM

## 2017-09-24 DIAGNOSIS — R1032 Left lower quadrant pain: Secondary | ICD-10-CM | POA: Insufficient documentation

## 2017-09-24 DIAGNOSIS — R319 Hematuria, unspecified: Secondary | ICD-10-CM

## 2017-09-24 IMAGING — CT CT ABD-PELV W/ CM
2 of 5 series · 17 of 46 positions shown, 19 images · IV contrast (iopamidol)
Comparison: None.

CLINICAL DATA: Severe abdominal and lower back pain radiating to
the left lower quadrant/left groin. Fall 2 weeks ago with hematuria.
Nausea. History of endometrial ablation.

EXAM:
CT ABDOMEN AND PELVIS WITH CONTRAST
TECHNIQUE: Multidetector CT imaging of the abdomen and pelvis was performed
using the standard protocol following bolus administration of
intravenous contrast.
CONTRAST:  100mL [U4] IOPAMIDOL ([U4]) INJECTION 61%

[Series 2: axial soft tissue · axial · 0.76mm/px · z∈[-905,-430]mm · 14 of 107 slices shown, 16 images]
[im 6/107  soft-tissue]
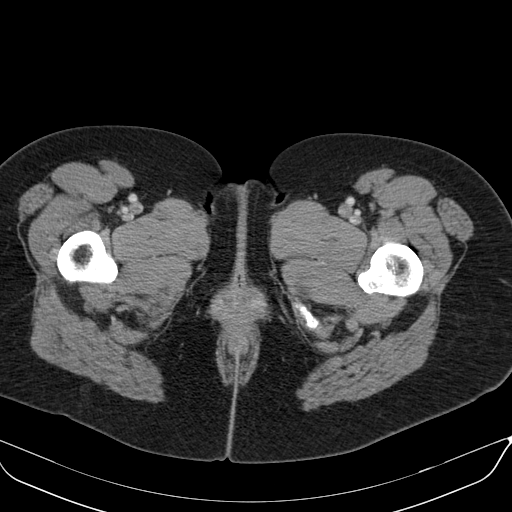
[im 6/107  bone]
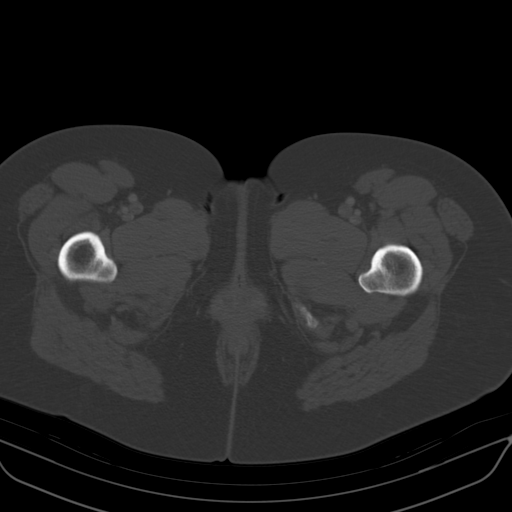
[im 12/107  soft-tissue]
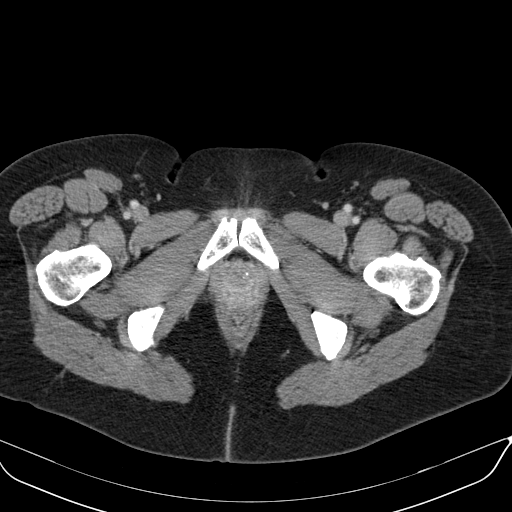
[im 23/107  soft-tissue]
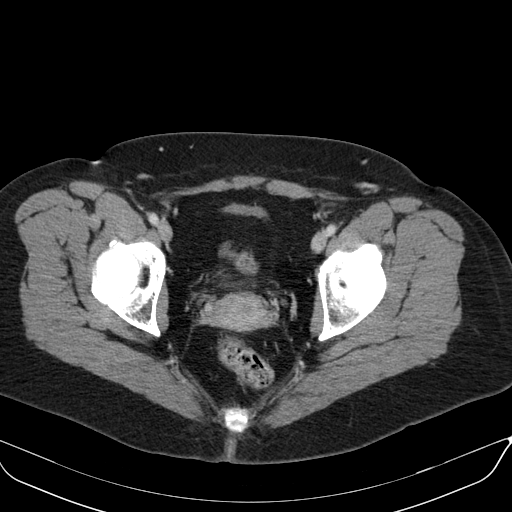
[im 28/107  soft-tissue]
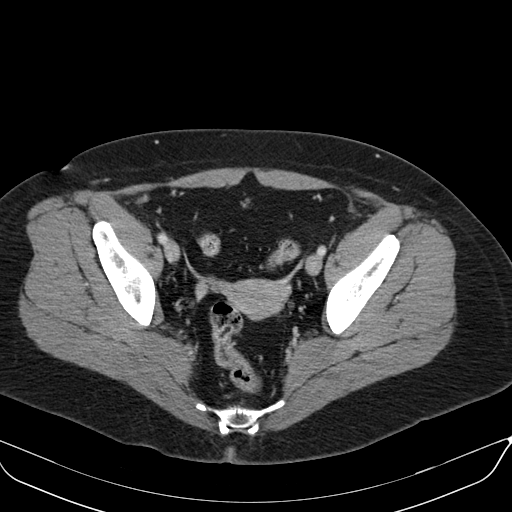
[im 34/107  soft-tissue]
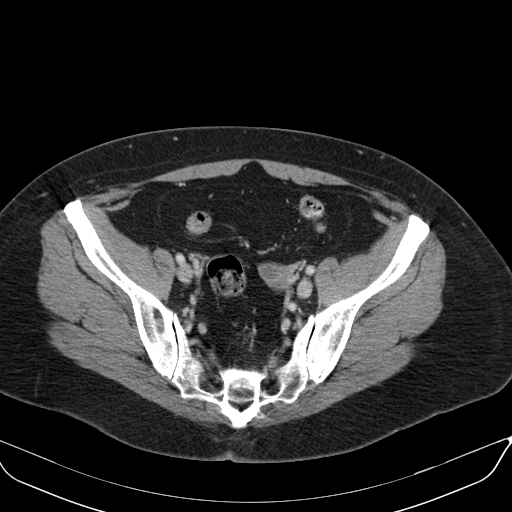
[im 45/107  soft-tissue]
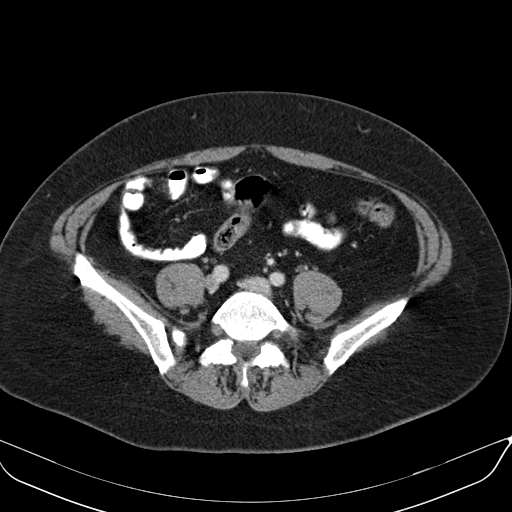
[im 51/107  soft-tissue]
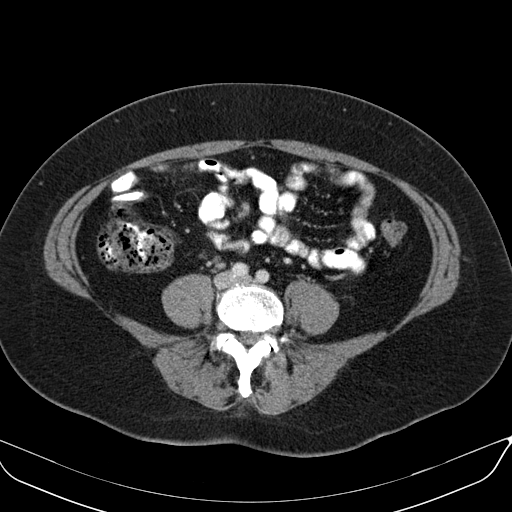
[im 56/107  soft-tissue]
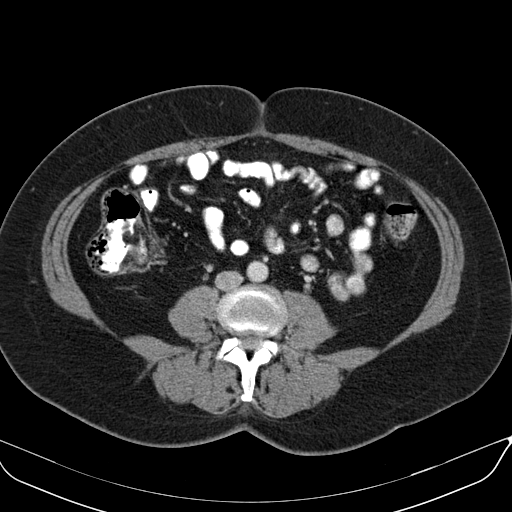
[im 62/107  soft-tissue]
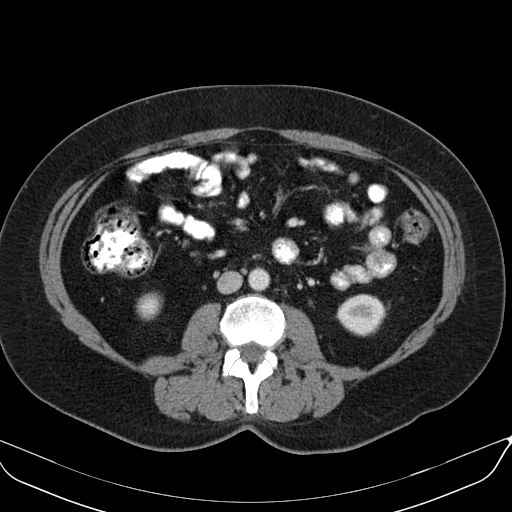
[im 62/107  bone]
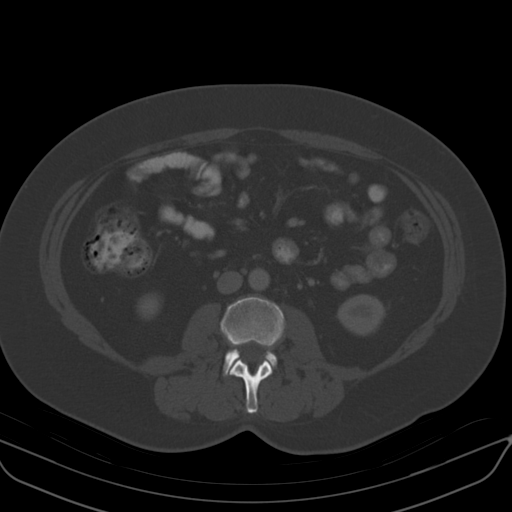
[im 73/107  soft-tissue]
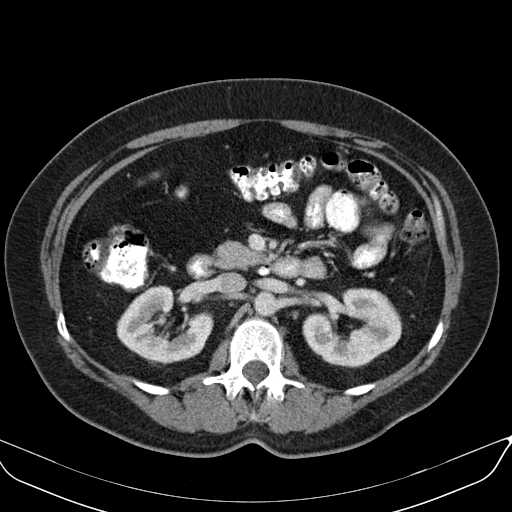
[im 79/107  soft-tissue]
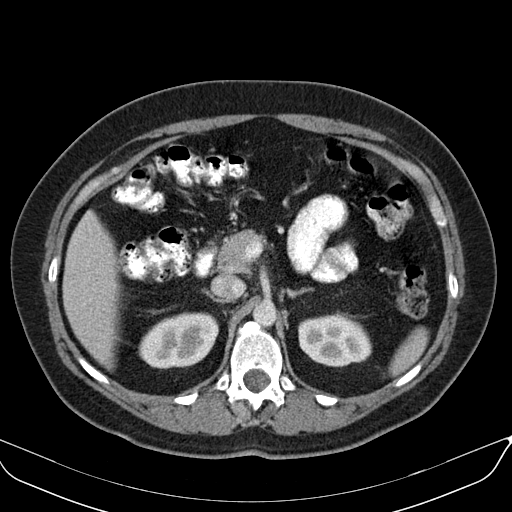
[im 84/107  soft-tissue]
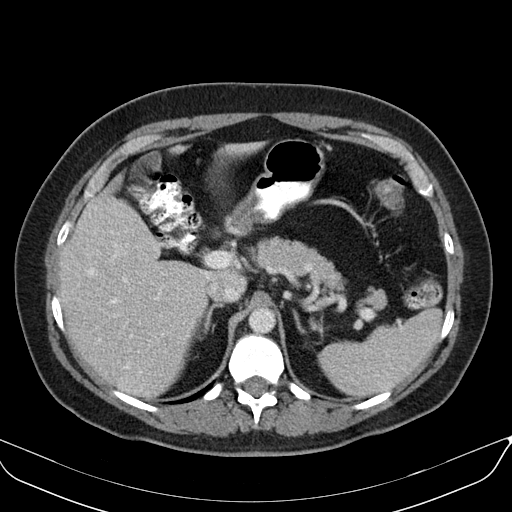
[im 95/107  soft-tissue]
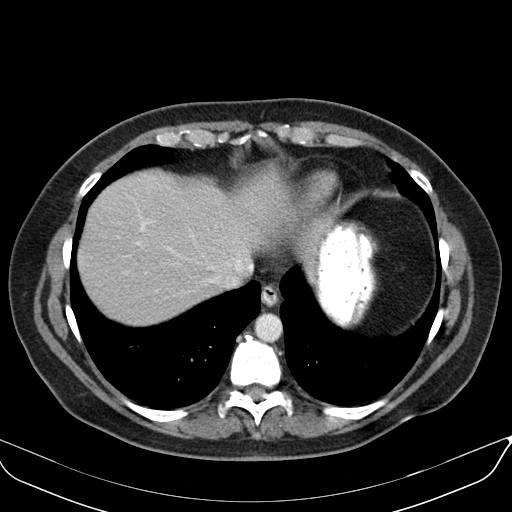
[im 101/107  soft-tissue]
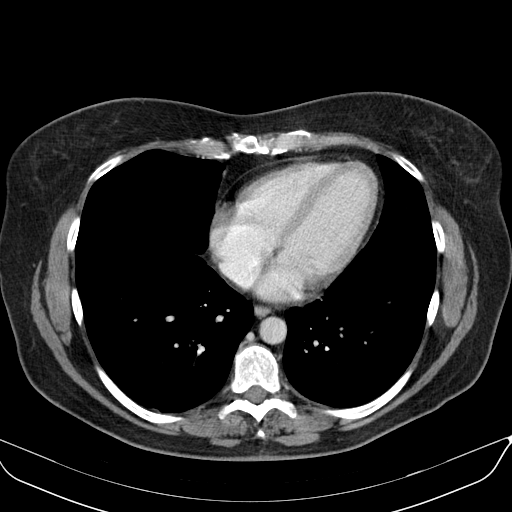

[Series 602: coronal · coronal · 1.03mm/px · 3 of 123 slices shown]
[im 41/123  soft-tissue]
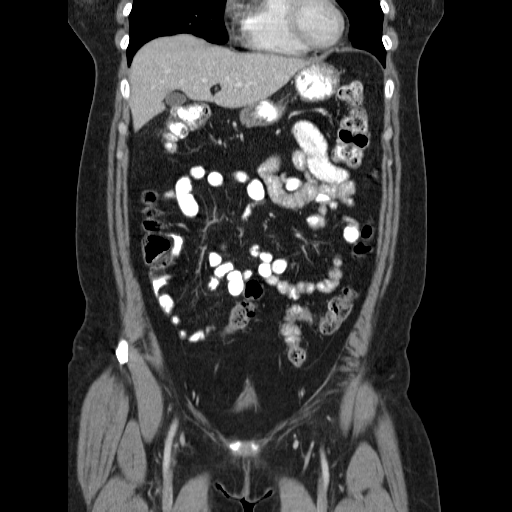
[im 55/123  soft-tissue]
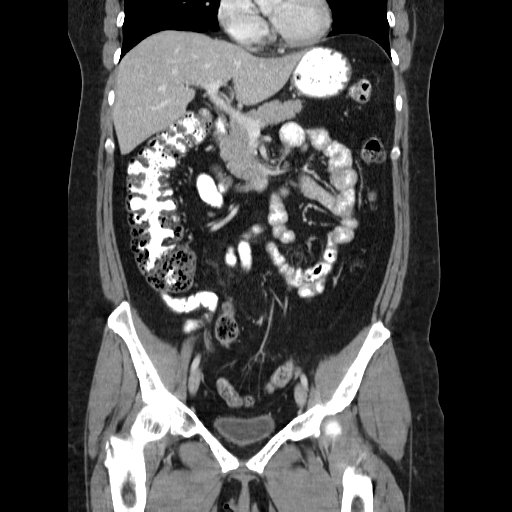
[im 68/123  soft-tissue]
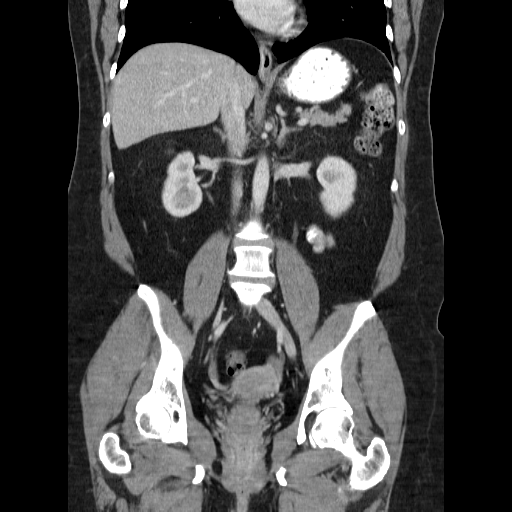

[17 of 46 positions shown; findings below may reference images not displayed]

FINDINGS: Lower chest: The visualized lung bases are clear.

Hepatobiliary: A 1.1 cm low-density lesion in hepatic segment STENBERG
likely represents a cyst. Unremarkable gallbladder. No biliary
dilatation.

Pancreas: Unremarkable.

Spleen: Unremarkable.

Adrenals/Urinary Tract: Unremarkable adrenal glands. No evidence of
renal mass, calculi, or hydronephrosis. Unremarkable bladder.

Stomach/Bowel: The stomach is within normal limits. There is no
evidence of bowel obstruction or inflammation. The appendix is
unremarkable.

Vascular/Lymphatic: No significant vascular findings are present. No
enlarged abdominal or pelvic lymph nodes.

Reproductive: Uterus and bilateral adnexa are unremarkable.

Other: No intraperitoneal free fluid. No abdominal wall mass or
hernia.

Musculoskeletal: No acute osseous abnormality or suspicious osseous
lesion. Mild lumbar spondylosis.
IMPRESSION: No acute abnormality or cause of left lower quadrant pain
identified.

## 2017-09-24 MED ORDER — IOPAMIDOL (ISOVUE-300) INJECTION 61%
100.0000 mL | Freq: Once | INTRAVENOUS | Status: AC | PRN
  Administered 2017-09-24: 100 mL via INTRAVENOUS

## 2017-09-28 ENCOUNTER — Other Ambulatory Visit: Payer: Self-pay

## 2017-11-25 ENCOUNTER — Encounter: Payer: Self-pay | Admitting: Family Medicine

## 2017-11-25 ENCOUNTER — Ambulatory Visit: Payer: BLUE CROSS/BLUE SHIELD | Admitting: Family Medicine

## 2017-11-25 VITALS — BP 120/90 | HR 92 | Temp 98.7°F | Ht 68.0 in | Wt 221.0 lb

## 2017-11-25 DIAGNOSIS — J40 Bronchitis, not specified as acute or chronic: Secondary | ICD-10-CM | POA: Diagnosis not present

## 2017-11-25 DIAGNOSIS — J0100 Acute maxillary sinusitis, unspecified: Secondary | ICD-10-CM

## 2017-11-25 MED ORDER — GUAIFENESIN-CODEINE 100-10 MG/5ML PO SYRP: 5.0000 mL | ORAL_SOLUTION | Freq: Three times a day (TID) | ORAL | 0 refills | Status: DC | PRN

## 2017-11-25 MED ORDER — DOXYCYCLINE HYCLATE 100 MG PO TABS: 100.0000 mg | ORAL_TABLET | Freq: Two times a day (BID) | ORAL | 0 refills | Status: DC

## 2017-11-25 NOTE — Progress Notes (Signed)
Name: Yolanda Young   MRN: [id number]    DOB: [birth year]   Date:11/25/2017       Progress Note  Subjective  Chief Complaint  Chief Complaint  Patient presents with  . Sinusitis    sore throat, cough at night- dry, - trying mucinex otc- not helping x 2 weeks    Sinusitis  This is a new problem. The current episode started 1 to 4 weeks ago (2 weeks). The problem has been gradually worsening since onset. There has been no fever. Her pain is at a severity of 2/10. The pain is mild. Associated symptoms include chills, congestion, coughing, diaphoresis, ear pain, headaches and a sore throat. Pertinent negatives include no neck pain, shortness of breath, sinus pressure or swollen glands. Past treatments include oral decongestants. The treatment provided no relief.  Cough  This is a new problem. The current episode started 1 to 4 weeks ago. The problem has been waxing and waning. The cough is non-productive. Associated symptoms include chills, ear pain, headaches and a sore throat. Pertinent negatives include no chest pain, ear congestion, fever, heartburn, myalgias, nasal congestion, rash, rhinorrhea, shortness of breath, weight loss or wheezing. There is no history of environmental allergies.  Sore Throat   This is a new problem. The current episode started 1 to 4 weeks ago. The problem has been unchanged. Neither side of throat is experiencing more pain than the other. There has been no fever. Associated symptoms include congestion, coughing, ear pain and headaches. Pertinent negatives include no abdominal pain, diarrhea, ear discharge, neck pain, shortness of breath, swollen glands or trouble swallowing. She has had no exposure to strep or mono.    No problem-specific Assessment & Plan notes found for this encounter.   Past Medical History:  Diagnosis Date  . Migraine     Past Surgical History:  Procedure Laterality Date  . ABLATION ON ENDOMETRIOSIS    . SHOULDER SURGERY Right     Family  History  Problem Relation Age of Onset  . Diabetes Mother   . Stroke Father   . Cancer Maternal Grandmother   . Cancer Maternal Grandfather   . Diabetes Paternal Grandmother     Social History   Socioeconomic History  . Marital status: Divorced    Spouse name: Not on file  . Number of children: Not on file  . Years of education: Not on file  . Highest education level: Not on file  Social Needs  . Financial resource strain: Not on file  . Food insecurity - worry: Not on file  . Food insecurity - inability: Not on file  . Transportation needs - medical: Not on file  . Transportation needs - non-medical: Not on file  Occupational History  . Not on file  Tobacco Use  . Smoking status: Former Research scientist (life sciences)  . Smokeless tobacco: Never Used  Substance and Sexual Activity  . Alcohol use: Yes    Alcohol/week: 0.0 oz  . Drug use: No  . Sexual activity: Yes  Other Topics Concern  . Not on file  Social History Narrative  . Not on file    Allergies  Allergen Reactions  . Erythromycin   . Penicillins     Outpatient Medications Prior to Visit  Medication Sig Dispense Refill  . topiramate (TOPAMAX) 50 MG tablet Take 1 tablet (50 mg total) by mouth daily. 30 tablet 2  . traMADol (ULTRAM) 50 MG tablet Take 1 tablet (50 mg total) by mouth every 6 (six) hours as  needed. (Patient not taking: Reported on 11/25/2017) 20 tablet 0  . cyclobenzaprine (FLEXERIL) 10 MG tablet Take 1 tablet (10 mg total) by mouth 3 (three) times daily as needed for muscle spasms. 30 tablet 0  . predniSONE (DELTASONE) 10 MG tablet Take 1 tablet (10 mg total) by mouth daily with breakfast. 42 tablet 0   No facility-administered medications prior to visit.     Review of Systems  Constitutional: Positive for chills and diaphoresis. Negative for fever, malaise/fatigue and weight loss.  HENT: Positive for congestion, ear pain and sore throat. Negative for ear discharge, rhinorrhea, sinus pressure and trouble  swallowing.   Eyes: Negative for blurred vision.  Respiratory: Positive for cough. Negative for sputum production, shortness of breath and wheezing.   Cardiovascular: Negative for chest pain, palpitations and leg swelling.  Gastrointestinal: Negative for abdominal pain, blood in stool, constipation, diarrhea, heartburn, melena and nausea.  Genitourinary: Negative for dysuria, frequency, hematuria and urgency.  Musculoskeletal: Negative for back pain, joint pain, myalgias and neck pain.  Skin: Negative for rash.  Neurological: Positive for headaches. Negative for dizziness, tingling, sensory change and focal weakness.  Endo/Heme/Allergies: Negative for environmental allergies and polydipsia. Does not bruise/bleed easily.  Psychiatric/Behavioral: Negative for depression and suicidal ideas. The patient is not nervous/anxious and does not have insomnia.      Objective  Vitals:   11/25/17 0941  BP: 120/90  Pulse: 92  Temp: 98.7 F (37.1 C)  TempSrc: Oral  Weight: 221 lb (100.2 kg)  Height: 5\' 8"  (1.727 m)    Physical Exam  HENT:  Right Ear: Tympanic membrane and ear canal normal.  Left Ear: Tympanic membrane and ear canal normal.  Nose: Right sinus exhibits maxillary sinus tenderness. Left sinus exhibits maxillary sinus tenderness.  Mouth/Throat: No oropharyngeal exudate, posterior oropharyngeal edema, posterior oropharyngeal erythema or tonsillar abscesses.  Lymphadenopathy:       Head (right side): Submandibular adenopathy present.       Head (left side): Submandibular adenopathy present.  tender  Nursing note and vitals reviewed.     Assessment & Plan  Problem List Items Addressed This Visit    None    Visit Diagnoses    Acute maxillary sinusitis, recurrence not specified    -  Primary   Relevant Medications   doxycycline (VIBRA-TABS) 100 MG tablet   guaiFENesin-codeine (ROBITUSSIN AC) 100-10 MG/5ML syrup   Bronchitis       Relevant Medications   doxycycline  (VIBRA-TABS) 100 MG tablet   guaiFENesin-codeine (ROBITUSSIN AC) 100-10 MG/5ML syrup      Meds ordered this encounter  Medications  . doxycycline (VIBRA-TABS) 100 MG tablet    Sig: Take 1 tablet (100 mg total) by mouth 2 (two) times daily.    Dispense:  20 tablet    Refill:  0  . guaiFENesin-codeine (ROBITUSSIN AC) 100-10 MG/5ML syrup    Sig: Take 5 mLs by mouth 3 (three) times daily as needed for cough.    Dispense:  100 mL    Refill:  0      Dr. Otilio Miu [facility]  11/25/17

## 2017-12-23 ENCOUNTER — Ambulatory Visit: Payer: Self-pay | Admitting: Family Medicine

## 2018-01-26 ENCOUNTER — Telehealth: Payer: Self-pay

## 2018-01-26 ENCOUNTER — Other Ambulatory Visit: Payer: Self-pay

## 2018-01-26 DIAGNOSIS — R110 Nausea: Secondary | ICD-10-CM

## 2018-01-26 DIAGNOSIS — R197 Diarrhea, unspecified: Secondary | ICD-10-CM

## 2018-01-26 DIAGNOSIS — R109 Unspecified abdominal pain: Secondary | ICD-10-CM

## 2018-01-26 NOTE — Telephone Encounter (Signed)
Pt's mom called today wanting her to see Dr Ronnald Ramp for continuing diarrhea, nausea and abdominal pain. We have seen pt for a full workup on abdominal pain and nausea. Also ordered a CT of abdomen in October, which came back normal/ no abnormal findings/ unremarkable gallbladder. I called the pt and told her that Dr Ronnald Ramp said the next step is GI for further eval. Referral was sent today. Pt advised that if she starts having severe pain to go to ER for eval.

## 2018-01-27 ENCOUNTER — Ambulatory Visit (INDEPENDENT_AMBULATORY_CARE_PROVIDER_SITE_OTHER): Payer: BLUE CROSS/BLUE SHIELD | Admitting: Gastroenterology

## 2018-01-27 ENCOUNTER — Encounter: Payer: Self-pay | Admitting: Gastroenterology

## 2018-01-27 ENCOUNTER — Other Ambulatory Visit: Payer: Self-pay

## 2018-01-27 VITALS — BP 129/75 | HR 94 | Ht 68.0 in | Wt 230.0 lb

## 2018-01-27 DIAGNOSIS — G8929 Other chronic pain: Secondary | ICD-10-CM

## 2018-01-27 DIAGNOSIS — R1013 Epigastric pain: Secondary | ICD-10-CM

## 2018-01-27 DIAGNOSIS — R1011 Right upper quadrant pain: Secondary | ICD-10-CM | POA: Diagnosis not present

## 2018-01-27 NOTE — Patient Instructions (Signed)
You are scheduled for RUQ abdominal/HIDA at [facility] on Friday, March 1st @ 9:30am. Please arrive at the medical mall at 9:15am.   You cannot have anything to eat or drink after midnight. Please hold any stomach medications 6 hours prior to scan. You may resume all medications when the scan is over.  If you need to reschedule this appointment for any reason, please contact central scheduling at [phone].

## 2018-01-28 ENCOUNTER — Other Ambulatory Visit: Payer: Self-pay

## 2018-01-28 DIAGNOSIS — Z1211 Encounter for screening for malignant neoplasm of colon: Secondary | ICD-10-CM

## 2018-01-28 NOTE — Progress Notes (Signed)
Gastroenterology Consultation  Referring Provider:     Juline Patch, MD Primary Care Physician:  Juline Patch, MD Primary Gastroenterologist:  Dr. Allen Norris     Reason for Consultation:     Epigastric pain        HPI:   Yolanda Young is a 52 y.o. y/o female referred for consultation & management of Epigastric pain by Dr. Juline Patch, MD.  This patient comes today after being told many years ago that she had a dysfunctioning gallbladder.  She now reports that she has abdominal pain that is in the epigastric area and is made worse with greasy or fatty foods.  She denies any unexplained weight loss and states that she has had some weight gain.  There is no report of any black stools or bloody stools.  She also denies any dysphagia nausea vomiting fevers or chills. The patient reports that she also has not had a colonoscopy in the past.  There is no report of any dysphagia associated with her epigastric pain. The patient does report that the pain is made worse with greasy and fatty foods.  Past Medical History:  Diagnosis Date  . Migraine     Past Surgical History:  Procedure Laterality Date  . ABLATION ON ENDOMETRIOSIS    . SHOULDER SURGERY Right     Prior to Admission medications   Medication Sig Start Date End Date Taking? Authorizing Provider  topiramate (TOPAMAX) 50 MG tablet Take 1 tablet (50 mg total) by mouth daily. 09/18/17  Yes Juline Patch, MD  doxycycline (VIBRA-TABS) 100 MG tablet Take 1 tablet (100 mg total) by mouth 2 (two) times daily. Patient not taking: Reported on 01/27/2018 11/25/17   Juline Patch, MD  guaiFENesin-codeine [facility]) 100-10 MG/5ML syrup Take 5 mLs by mouth 3 (three) times daily as needed for cough. Patient not taking: Reported on 01/27/2018 11/25/17   Juline Patch, MD  traMADol (ULTRAM) 50 MG tablet Take 1 tablet (50 mg total) by mouth every 6 (six) hours as needed. Patient not taking: Reported on 11/25/2017 09/18/17   Juline Patch,  MD    Family History  Problem Relation Age of Onset  . Diabetes Mother   . Stroke Father   . Cancer Maternal Grandmother   . Cancer Maternal Grandfather   . Diabetes Paternal Grandmother      Social History   Tobacco Use  . Smoking status: Former Research scientist (life sciences)  . Smokeless tobacco: Never Used  Substance Use Topics  . Alcohol use: Yes    Alcohol/week: 0.0 oz  . Drug use: No    Allergies as of 01/27/2018 - Review Complete 01/27/2018  Allergen Reaction Noted  . Erythromycin  06/20/2016  . Penicillins  06/20/2016    Review of Systems:    All systems reviewed and negative except where noted in HPI.   Physical Exam:  BP 129/75   Pulse 94   Ht 5\' 8"  (1.727 m)   Wt 230 lb (104.3 kg)   BMI 34.97 kg/m  No LMP recorded. Patient has had an ablation. Psych:  Alert and cooperative. Normal mood and affect. General:   Alert,  Well-developed, well-nourished, pleasant and cooperative in NAD Head:  Normocephalic and atraumatic. Eyes:  Sclera clear, no icterus.   Conjunctiva pink. Ears:  Normal auditory acuity. Nose:  No deformity, discharge, or lesions. Mouth:  No deformity or lesions,oropharynx pink & moist. Neck:  Supple; no masses or thyromegaly. Lungs:  Respirations even and  unlabored.  Clear throughout to auscultation.   No wheezes, crackles, or rhonchi. No acute distress. Heart:  Regular rate and rhythm; no murmurs, clicks, rubs, or gallops. Abdomen:  Normal bowel sounds.  No bruits.  Soft, non-tender and non-distended without masses, hepatosplenomegaly or hernias noted.  No guarding or rebound tenderness.  Negative Carnett sign.   Rectal:  Deferred.  Msk:  Symmetrical without gross deformities.  Good, equal movement & strength bilaterally. Pulses:  Normal pulses noted. Extremities:  No clubbing or edema.  No cyanosis. Neurologic:  Alert and oriented x3;  grossly normal neurologically. Skin:  Intact without significant lesions or rashes.  No jaundice. Lymph Nodes:  No significant  cervical adenopathy. Psych:  Alert and cooperative. Normal mood and affect.  Imaging Studies: No results found.  Assessment and Plan:   Yolanda Young is a 52 y.o. y/o female who comes in with a history of epigastric pain and being told in the past that she has a dysfunctional gallbladder.  The patient will be set up for a right upper quadrant ultrasound with a gallbladder emptying study.  The patient will also be set up for a screening colonoscopy since she has not had a colonoscopy in is 52 years old.  At the time of colonoscopy she will also undergo an EGD because of her epigastric pain. I have discussed risks & benefits which include, but are not limited to, bleeding, infection, perforation & drug reaction.  The patient agrees with this plan & written consent will be obtained.     Lucilla Lame, MD. Marval Regal   Note: This dictation was prepared with Dragon dictation along with smaller phrase technology. Any transcriptional errors that result from this process are unintentional.

## 2018-02-01 ENCOUNTER — Encounter: Payer: Self-pay | Admitting: *Deleted

## 2018-02-01 ENCOUNTER — Other Ambulatory Visit: Payer: Self-pay

## 2018-02-02 NOTE — Discharge Instructions (Signed)
General Anesthesia, Adult, Care After °These instructions provide you with information about caring for yourself after your procedure. Your health care provider may also give you more specific instructions. Your treatment has been planned according to current medical practices, but problems sometimes occur. Call your health care provider if you have any problems or questions after your procedure. °What can I expect after the procedure? °After the procedure, it is common to have: °· Vomiting. °· A sore throat. °· Mental slowness. ° °It is common to feel: °· Nauseous. °· Cold or shivery. °· Sleepy. °· Tired. °· Sore or achy, even in parts of your body where you did not have surgery. ° °Follow these instructions at home: °For at least 24 hours after the procedure: °· Do not: °? Participate in activities where you could fall or become injured. °? Drive. °? Use heavy machinery. °? Drink alcohol. °? Take sleeping pills or medicines that cause drowsiness. °? Make important decisions or sign legal documents. °? Take care of children on your own. °· Rest. °Eating and drinking °· If you vomit, drink water, juice, or soup when you can drink without vomiting. °· Drink enough fluid to keep your urine clear or pale yellow. °· Make sure you have little or no nausea before eating solid foods. °· Follow the diet recommended by your health care provider. °General instructions °· Have a responsible adult stay with you until you are awake and alert. °· Return to your normal activities as told by your health care provider. Ask your health care provider what activities are safe for you. °· Take over-the-counter and prescription medicines only as told by your health care provider. °· If you smoke, do not smoke without supervision. °· Keep all follow-up visits as told by your health care provider. This is important. °Contact a health care provider if: °· You continue to have nausea or vomiting at home, and medicines are not helpful. °· You  cannot drink fluids or start eating again. °· You cannot urinate after 8-12 hours. °· You develop a skin rash. °· You have fever. °· You have increasing redness at the site of your procedure. °Get help right away if: °· You have difficulty breathing. °· You have chest pain. °· You have unexpected bleeding. °· You feel that you are having a life-threatening or urgent problem. °This information is not intended to replace advice given to you by your health care provider. Make sure you discuss any questions you have with your health care provider. °Document Released: 03/02/2001 Document Revised: 04/28/2016 Document Reviewed: 11/08/2015 °Elsevier Interactive Patient Education © 2018 Elsevier Inc. ° °

## 2018-02-04 ENCOUNTER — Ambulatory Visit: Payer: BLUE CROSS/BLUE SHIELD | Admitting: Anesthesiology

## 2018-02-04 ENCOUNTER — Ambulatory Visit
Admission: RE | Admit: 2018-02-04 | Discharge: 2018-02-04 | Disposition: A | Discharge status: Home / Self Care | Payer: BLUE CROSS/BLUE SHIELD | Source: Ambulatory Visit | Attending: Gastroenterology | Admitting: Gastroenterology

## 2018-02-04 ENCOUNTER — Encounter
Admission: RE | Disposition: A | Discharge status: Home / Self Care | Payer: Self-pay | Source: Ambulatory Visit | Attending: Gastroenterology

## 2018-02-04 DIAGNOSIS — K2950 Unspecified chronic gastritis without bleeding: Secondary | ICD-10-CM | POA: Diagnosis not present

## 2018-02-04 DIAGNOSIS — K635 Polyp of colon: Secondary | ICD-10-CM | POA: Insufficient documentation

## 2018-02-04 DIAGNOSIS — D124 Benign neoplasm of descending colon: Secondary | ICD-10-CM | POA: Diagnosis not present

## 2018-02-04 DIAGNOSIS — D125 Benign neoplasm of sigmoid colon: Secondary | ICD-10-CM

## 2018-02-04 DIAGNOSIS — Z79899 Other long term (current) drug therapy: Secondary | ICD-10-CM | POA: Diagnosis not present

## 2018-02-04 DIAGNOSIS — R1010 Upper abdominal pain, unspecified: Secondary | ICD-10-CM

## 2018-02-04 DIAGNOSIS — R1013 Epigastric pain: Secondary | ICD-10-CM

## 2018-02-04 DIAGNOSIS — Z860100 Personal history of colon polyps, unspecified: Secondary | ICD-10-CM

## 2018-02-04 DIAGNOSIS — Z880 Allergy status to penicillin: Secondary | ICD-10-CM | POA: Insufficient documentation

## 2018-02-04 DIAGNOSIS — K219 Gastro-esophageal reflux disease without esophagitis: Secondary | ICD-10-CM | POA: Insufficient documentation

## 2018-02-04 DIAGNOSIS — K449 Diaphragmatic hernia without obstruction or gangrene: Secondary | ICD-10-CM | POA: Insufficient documentation

## 2018-02-04 DIAGNOSIS — K2970 Gastritis, unspecified, without bleeding: Secondary | ICD-10-CM | POA: Diagnosis not present

## 2018-02-04 DIAGNOSIS — Z1211 Encounter for screening for malignant neoplasm of colon: Secondary | ICD-10-CM | POA: Diagnosis not present

## 2018-02-04 DIAGNOSIS — Z881 Allergy status to other antibiotic agents status: Secondary | ICD-10-CM | POA: Diagnosis not present

## 2018-02-04 DIAGNOSIS — Z87891 Personal history of nicotine dependence: Secondary | ICD-10-CM | POA: Insufficient documentation

## 2018-02-04 HISTORY — DX: Gastro-esophageal reflux disease without esophagitis: K21.9

## 2018-02-04 HISTORY — PX: COLONOSCOPY WITH PROPOFOL: SHX5780

## 2018-02-04 HISTORY — PX: ESOPHAGOGASTRODUODENOSCOPY (EGD) WITH PROPOFOL: SHX5813

## 2018-02-04 HISTORY — DX: Polyp of colon: K63.5

## 2018-02-04 SURGERY — COLONOSCOPY WITH PROPOFOL
Anesthesia: General | Site: Throat | Wound class: Contaminated

## 2018-02-04 MED ORDER — SODIUM CHLORIDE 0.9 % IV SOLN: INTRAVENOUS | Status: DC

## 2018-02-04 MED ORDER — PROPOFOL 10 MG/ML IV BOLUS
INTRAVENOUS | Status: DC | PRN
  Administered 2018-02-04: 30 mg via INTRAVENOUS
  Administered 2018-02-04: 20 mg via INTRAVENOUS
  Administered 2018-02-04: 30 mg via INTRAVENOUS
  Administered 2018-02-04: 150 mg via INTRAVENOUS
  Administered 2018-02-04 (×2): 30 mg via INTRAVENOUS
  Administered 2018-02-04: 50 mg via INTRAVENOUS
  Administered 2018-02-04: 40 mg via INTRAVENOUS
  Administered 2018-02-04 (×3): 30 mg via INTRAVENOUS

## 2018-02-04 MED ORDER — FAMOTIDINE IN NACL 20-0.9 MG/50ML-% IV SOLN: 20.0000 mg | Freq: Once | INTRAVENOUS | Status: DC

## 2018-02-04 MED ORDER — GLYCOPYRROLATE 0.2 MG/ML IJ SOLN
INTRAMUSCULAR | Status: DC | PRN
  Administered 2018-02-04: 0.1 mg via INTRAVENOUS

## 2018-02-04 MED ORDER — PANTOPRAZOLE SODIUM 40 MG PO TBEC: 40.0000 mg | DELAYED_RELEASE_TABLET | Freq: Every day | ORAL | 5 refills | Status: DC

## 2018-02-04 MED ORDER — LACTATED RINGERS IV SOLN
INTRAVENOUS | Status: DC
  Administered 2018-02-04: 10:00:00 via INTRAVENOUS

## 2018-02-04 MED ORDER — LIDOCAINE HCL (CARDIAC) 20 MG/ML IV SOLN
INTRAVENOUS | Status: DC | PRN
  Administered 2018-02-04: 50 mg via INTRAVENOUS

## 2018-02-04 MED ORDER — STERILE WATER FOR IRRIGATION IR SOLN
Status: DC | PRN
  Administered 2018-02-04: .5 mL

## 2018-02-04 MED ORDER — METOCLOPRAMIDE HCL 5 MG/ML IJ SOLN
10.0000 mg | Freq: Once | INTRAMUSCULAR | Status: AC
  Administered 2018-02-04: 10 mg via INTRAVENOUS

## 2018-02-04 SURGICAL SUPPLY — 9 items
BLOCK BITE 60FR ADLT L/F GRN (MISCELLANEOUS) ×3 IMPLANT
CANISTER SUCT 1200ML W/VALVE (MISCELLANEOUS) ×3 IMPLANT
FORCEPS BIOP RAD 4 LRG CAP 4 (CUTTING FORCEPS) ×3 IMPLANT
GOWN CVR UNV OPN BCK APRN NK (MISCELLANEOUS) ×4 IMPLANT
GOWN ISOL THUMB LOOP REG UNIV (MISCELLANEOUS) ×2
KIT ENDO PROCEDURE OLY (KITS) ×3 IMPLANT
SNARE SHORT THROW 13M SML OVAL (MISCELLANEOUS) ×3 IMPLANT
TRAP SUCTION POLY (MISCELLANEOUS) ×3 IMPLANT
WATER STERILE IRR 250ML POUR (IV SOLUTION) ×3 IMPLANT

## 2018-02-04 NOTE — Op Note (Signed)
[facility] Gastroenterology Patient Name: Yolanda Young Procedure Date: 02/04/2018 10:41 AM MRN: [id number] Account #: [id number] Date of Birth: March 04, 1966 Admit Type: Outpatient Age: 52 Room: [facility] OR ROOM 01 Gender: Female Note Status: Finalized Procedure:            Upper GI endoscopy Indications:          Epigastric abdominal pain Providers:            Lucilla Lame MD, MD Referring MD:         Ferd Hibbs, MD (Referring MD), Juline Patch, MD                        (Referring MD) Medicines:            Propofol per Anesthesia Complications:        No immediate complications. Procedure:            Pre-Anesthesia Assessment:                       - Prior to the procedure, a History and Physical was                        performed, and patient medications and allergies were                        reviewed. The patient's tolerance of previous                        anesthesia was also reviewed. The risks and benefits of                        the procedure and the sedation options and risks were                        discussed with the patient. All questions were                        answered, and informed consent was obtained. Prior                        Anticoagulants: The patient has taken no previous                        anticoagulant or antiplatelet agents. ASA Grade                        Assessment: II - A patient with mild systemic disease.                        After reviewing the risks and benefits, the patient was                        deemed in satisfactory condition to undergo the                        procedure.                       After obtaining informed consent, the endoscope was  passed under direct vision. Throughout the procedure,                        the patient's blood pressure, pulse, and oxygen                        saturations were monitored continuously. The Olympus                        GIF-HQ190  Endoscope (S#. [id number]) was introduced                        through the mouth, and advanced to the second part of                        duodenum. The upper GI endoscopy was accomplished                        without difficulty. The patient tolerated the procedure                        well. Findings:      A medium-sized hiatal hernia was present.      Localized mild inflammation characterized by erythema was found in the       gastric antrum. Biopsies were taken with a cold forceps for histology.      The examined duodenum was normal. Impression:           - Medium-sized hiatal hernia.                       - Gastritis. Biopsied.                       - Normal examined duodenum. Recommendation:       - Await pathology results.                       - Discharge patient to home.                       - Resume previous diet.                       - Continue present medications.                       - Use a proton pump inhibitor PO daily. Procedure Code(s):    --- Professional ---                       [phone], Esophagogastroduodenoscopy, flexible, transoral;                        with biopsy, single or multiple Diagnosis Code(s):    --- Professional ---                       R10.13, Epigastric pain                       K29.70, Gastritis, unspecified, without bleeding CPT copyright 2016 American Medical Association. All rights reserved. The codes documented in this report are preliminary and upon coder review may  be revised  to meet current compliance requirements. Lucilla Lame MD, MD 02/04/2018 10:59:06 AM This report has been signed electronically. Number of Addenda: 0 Note Initiated On: 02/04/2018 10:41 AM      [facility]

## 2018-02-04 NOTE — Op Note (Signed)
[facility] Gastroenterology Patient Name: Yolanda Young Procedure Date: 02/04/2018 10:49 AM MRN: [id number] Account #: [id number] Date of Birth: 02-Nov-1966 Admit Type: Outpatient Age: 52 Room: [facility] OR ROOM 01 Gender: Female Note Status: Finalized Procedure:            Colonoscopy Indications:          Screening for colorectal malignant neoplasm Providers:            Lucilla Lame MD, MD Referring MD:         Juline Patch, MD (Referring MD) Medicines:            Propofol per Anesthesia Complications:        No immediate complications. Procedure:            Pre-Anesthesia Assessment:                       - Prior to the procedure, a History and Physical was                        performed, and patient medications and allergies were                        reviewed. The patient's tolerance of previous                        anesthesia was also reviewed. The risks and benefits of                        the procedure and the sedation options and risks were                        discussed with the patient. All questions were                        answered, and informed consent was obtained. Prior                        Anticoagulants: The patient has taken no previous                        anticoagulant or antiplatelet agents. ASA Grade                        Assessment: II - A patient with mild systemic disease.                        After reviewing the risks and benefits, the patient was                        deemed in satisfactory condition to undergo the                        procedure.                       After obtaining informed consent, the colonoscope was                        passed under direct vision. Throughout the procedure,  the patient's blood pressure, pulse, and oxygen                        saturations were monitored continuously. The Olympus                        CF-HQ190L Colonoscope (S#. [id number]) was introduced                   through the anus and advanced to the the cecum,                        identified by appendiceal orifice and ileocecal valve.                        The colonoscopy was performed without difficulty. The                        patient tolerated the procedure well. The quality of                        the bowel preparation was excellent. Findings:      The perianal and digital rectal examinations were normal.      A 4 mm polyp was found in the sigmoid colon. The polyp was sessile. The       polyp was removed with a cold snare. Resection and retrieval were       complete.      A 5 mm polyp was found in the descending colon. The polyp was sessile.       The polyp was removed with a cold snare. Resection and retrieval were       complete. Impression:           - One 4 mm polyp in the sigmoid colon, removed with a                        cold snare. Resected and retrieved.                       - One 5 mm polyp in the descending colon, removed with                        a cold snare. Resected and retrieved. Recommendation:       - Discharge patient to home.                       - Resume previous diet.                       - Continue present medications.                       - Await pathology results.                       - Repeat colonoscopy in 5 years if polyp adenoma and 10                        years if hyperplastic Procedure Code(s):    --- Professional ---  45385, Colonoscopy, flexible; with removal of tumor(s),                        polyp(s), or other lesion(s) by snare technique Diagnosis Code(s):    --- Professional ---                       Z12.11, Encounter for screening for malignant neoplasm                        of colon                       D12.5, Benign neoplasm of sigmoid colon                       D12.4, Benign neoplasm of descending colon CPT copyright 2016 American Medical Association. All rights reserved. The codes  documented in this report are preliminary and upon coder review may  be revised to meet current compliance requirements. Lucilla Lame MD, MD 02/04/2018 11:19:49 AM This report has been signed electronically. Number of Addenda: 0 Note Initiated On: 02/04/2018 10:49 AM Scope Withdrawal Time: 0 hours 7 minutes 27 seconds  Total Procedure Duration: 0 hours 16 minutes 15 seconds       [facility]

## 2018-02-04 NOTE — Anesthesia Preprocedure Evaluation (Signed)
Anesthesia Evaluation  Patient identified by MRN, date of birth, ID band Patient awake    Reviewed: Allergy & Precautions, NPO status , Patient's Chart, lab work & pertinent test results  History of Anesthesia Complications Negative for: history of anesthetic complications  Airway Mallampati: I  TM Distance: >3 FB Neck ROM: Full    Dental no notable dental hx.    Pulmonary former smoker (quit 1999),    Pulmonary exam normal breath sounds clear to auscultation       Cardiovascular Exercise Tolerance: Good negative cardio ROS Normal cardiovascular exam Rhythm:Regular Rate:Normal     Neuro/Psych  Headaches,    GI/Hepatic GERD  Poorly Controlled,  Endo/Other  negative endocrine ROS  Renal/GU negative Renal ROS     Musculoskeletal   Abdominal   Peds  Hematology negative hematology ROS (+)   Anesthesia Other Findings   Reproductive/Obstetrics                             Anesthesia Physical Anesthesia Plan  ASA: II  Anesthesia Plan: General   Post-op Pain Management:    Induction: Intravenous  PONV Risk Score and Plan: 2 and TIVA and Propofol infusion  Airway Management Planned: Natural Airway  Additional Equipment:   Intra-op Plan:   Post-operative Plan:   Informed Consent: I have reviewed the patients History and Physical, chart, labs and discussed the procedure including the risks, benefits and alternatives for the proposed anesthesia with the patient or authorized representative who has indicated his/her understanding and acceptance.     Plan Discussed with: CRNA  Anesthesia Plan Comments:         Anesthesia Quick Evaluation

## 2018-02-04 NOTE — Anesthesia Procedure Notes (Signed)
Date/Time: 02/04/2018 10:47 AM Performed by: Cameron Ali, CRNA Pre-anesthesia Checklist: Patient identified, Emergency Drugs available, Suction available, Timeout performed and Patient being monitored Patient Re-evaluated:Patient Re-evaluated prior to induction Oxygen Delivery Method: Nasal cannula Placement Confirmation: positive ETCO2

## 2018-02-04 NOTE — H&P (Signed)
   Lucilla Lame, MD [address] [place], [place] [address] Phone:[phone] Fax : [phone]  Primary Care Physician:  Juline Patch, MD Primary Gastroenterologist:  Dr. Allen Norris  Pre-Procedure History & Physical: HPI:  Yolanda Young is a 52 y.o. female is here for an endoscopy and colonoscopy.   Past Medical History:  Diagnosis Date  . GERD (gastroesophageal reflux disease)   . Migraine    weekly    Past Surgical History:  Procedure Laterality Date  . ABLATION ON ENDOMETRIOSIS    . SHOULDER SURGERY Right     Prior to Admission medications   Medication Sig Start Date End Date Taking? Authorizing Provider  topiramate (TOPAMAX) 50 MG tablet Take 1 tablet (50 mg total) by mouth daily. 09/18/17  Yes Juline Patch, MD  traMADol (ULTRAM) 50 MG tablet Take 1 tablet (50 mg total) by mouth every 6 (six) hours as needed. Patient not taking: Reported on 11/25/2017 09/18/17   Juline Patch, MD    Allergies as of 01/28/2018 - Review Complete 01/27/2018  Allergen Reaction Noted  . Erythromycin  06/20/2016  . Penicillins  06/20/2016    Family History  Problem Relation Age of Onset  . Diabetes Mother   . Stroke Father   . Cancer Maternal Grandmother   . Cancer Maternal Grandfather   . Diabetes Paternal Grandmother     Social History   Socioeconomic History  . Marital status: Divorced    Spouse name: Not on file  . Number of children: Not on file  . Years of education: Not on file  . Highest education level: Not on file  Social Needs  . Financial resource strain: Not on file  . Food insecurity - worry: Not on file  . Food insecurity - inability: Not on file  . Transportation needs - medical: Not on file  . Transportation needs - non-medical: Not on file  Occupational History  . Not on file  Tobacco Use  . Smoking status: Former Smoker    Last attempt to quit: 1999    Years since quitting: 20.1  . Smokeless tobacco: Never Used  Substance and Sexual  Activity  . Alcohol use: Yes    Alcohol/week: 2.4 oz    Types: 4 Glasses of wine per week  . Drug use: No  . Sexual activity: Yes  Other Topics Concern  . Not on file  Social History Narrative  . Not on file    Review of Systems: See HPI, otherwise negative ROS  Physical Exam: BP 129/89   Pulse 85   Temp 98.6 F (37 C) (Temporal)   Resp 16   Ht 5\' 8"  (1.727 m)   Wt 221 lb (100.2 kg)   SpO2 98%   BMI 33.60 kg/m  General:   Alert,  pleasant and cooperative in NAD Head:  Normocephalic and atraumatic. Neck:  Supple; no masses or thyromegaly. Lungs:  Clear throughout to auscultation.    Heart:  Regular rate and rhythm. Abdomen:  Soft, nontender and nondistended. Normal bowel sounds, without guarding, and without rebound.   Neurologic:  Alert and  oriented x4;  grossly normal neurologically.  Impression/Plan: Yolanda Young is here for an endoscopy and colonoscopy to be performed for screening and epigastric pain.  Risks, benefits, limitations, and alternatives regarding  endoscopy and colonoscopy have been reviewed with the patient.  Questions have been answered.  All parties agreeable.   Lucilla Lame, MD  02/04/2018, 10:09 AM

## 2018-02-04 NOTE — Anesthesia Postprocedure Evaluation (Signed)
Anesthesia Post Note  Patient: Yolanda Young  Procedure(s) Performed: COLONOSCOPY WITH PROPOFOL (N/A Rectum) ESOPHAGOGASTRODUODENOSCOPY (EGD) WITH PROPOFOL (N/A Throat)  Patient location during evaluation: PACU Anesthesia Type: General Level of consciousness: awake and alert, oriented and patient cooperative Pain management: pain level controlled Vital Signs Assessment: post-procedure vital signs reviewed and stable Respiratory status: spontaneous breathing, nonlabored ventilation and respiratory function stable Cardiovascular status: blood pressure returned to baseline and stable Postop Assessment: adequate PO intake Anesthetic complications: no    Darrin Nipper

## 2018-02-04 NOTE — Transfer of Care (Signed)
Immediate Anesthesia Transfer of Care Note  Patient: Yolanda Young  Procedure(s) Performed: COLONOSCOPY WITH PROPOFOL (N/A Rectum) ESOPHAGOGASTRODUODENOSCOPY (EGD) WITH PROPOFOL (N/A Throat)  Patient Location: PACU  Anesthesia Type: General  Level of Consciousness: awake, alert  and patient cooperative  Airway and Oxygen Therapy: Patient Spontanous Breathing and Patient connected to supplemental oxygen  Post-op Assessment: Post-op Vital signs reviewed, Patient's Cardiovascular Status Stable, Respiratory Function Stable, Patent Airway and No signs of Nausea or vomiting  Post-op Vital Signs: Reviewed and stable  Complications: No apparent anesthesia complications

## 2018-02-05 ENCOUNTER — Ambulatory Visit
Admission: RE | Admit: 2018-02-05 | Discharge: 2018-02-05 | Disposition: A | Discharge status: Home / Self Care | Payer: BLUE CROSS/BLUE SHIELD | Source: Ambulatory Visit | Attending: Gastroenterology | Admitting: Gastroenterology

## 2018-02-05 DIAGNOSIS — R1011 Right upper quadrant pain: Secondary | ICD-10-CM | POA: Insufficient documentation

## 2018-02-05 IMAGING — NM NM HEPATO W/GB/PHARM/[PERSON_NAME]
2 series · 12 of 12 positions shown · non-contrast
Comparison: None.

CLINICAL DATA: Right upper quadrant pain

EXAM:
NUCLEAR MEDICINE HEPATOBILIARY IMAGING WITH GALLBLADDER EF
VIEWS:
Anterior right upper quadrant
RADIOPHARMACEUTICALS:  5.38 mCi [UY]  Choletec IV

[Series 1000: hepatobiliary scan · 9.59mm/px · 6 of 60 frames shown]
[frame 6/60]
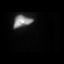
[frame 16/60]
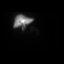
[frame 26/60]
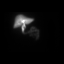
[frame 36/60]
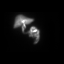
[frame 46/60]
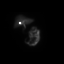
[frame 56/60]
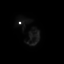

[Series 1000: gallbladder ef · 4.80mm/px · 6 of 120 frames shown]
[frame 11/120]
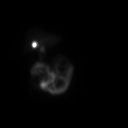
[frame 31/120]
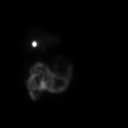
[frame 51/120]
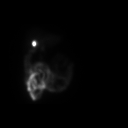
[frame 71/120]
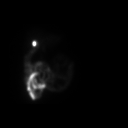
[frame 91/120]
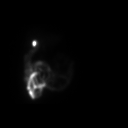
[frame 111/120]
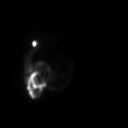

[12 of 12 positions shown; findings below may reference images not displayed]

FINDINGS: Liver uptake of radiotracer in the liver is normal. There is prompt
visualization of small bowel indicating patency of the common bile
duct. There is mild delay in gallbladder visualization. Gallbladder
does visualize after approximately 45 minutes, indicating that there
is patency of the cystic duct.

The patient consumed 8 ounces of Ensure orally with calculation of
the computer generated ejection fraction of radiotracer from the
gallbladder. The patient did complain of moderate pain with the oral
Ensure consumption. The computer generated ejection fraction of
radiotracer the gallbladder is low normal at 36%, normal greater
than 33% using the oral agent.
IMPRESSION: 1. Mild delay in gallbladder visualization. This finding has been
associated with a degree of chronic cholecystitis. The cystic duct
is patent as is evidenced by gallbladder visualization.

2. The patient had moderate pain after oral Ensure consumption. The
ejection fraction of radiotracer from the gallbladder is 36%, low
normal with normal greater than 33% using the oral agent.

3. Prompt visualization of small bowel indicating patency of the
common bile duct.

## 2018-02-05 MED ORDER — TECHNETIUM TC 99M MEBROFENIN IV KIT
5.3800 | PACK | Freq: Once | INTRAVENOUS | Status: AC | PRN
  Administered 2018-02-05: 5.38 via INTRAVENOUS

## 2018-02-08 ENCOUNTER — Telehealth: Payer: Self-pay

## 2018-02-08 ENCOUNTER — Other Ambulatory Visit: Payer: Self-pay

## 2018-02-08 ENCOUNTER — Encounter: Payer: Self-pay | Admitting: General Surgery

## 2018-02-08 ENCOUNTER — Encounter: Payer: Self-pay | Admitting: Gastroenterology

## 2018-02-08 DIAGNOSIS — R932 Abnormal findings on diagnostic imaging of liver and biliary tract: Secondary | ICD-10-CM

## 2018-02-08 NOTE — Telephone Encounter (Signed)
-----   Message from Lucilla Lame, MD sent at 02/06/2018 10:02 AM EST ----- Let the patient know the gal bladder study was abnormal and she should be seen by surgery.

## 2018-02-08 NOTE — Telephone Encounter (Signed)
Pt notified of HIDA scan results. Referral sent to [facility].

## 2018-02-15 ENCOUNTER — Ambulatory Visit: Payer: BLUE CROSS/BLUE SHIELD | Admitting: Family Medicine

## 2018-02-15 ENCOUNTER — Encounter: Payer: Self-pay | Admitting: Family Medicine

## 2018-02-15 VITALS — BP 110/80 | HR 64 | Temp 98.0°F | Ht 68.0 in | Wt 224.0 lb

## 2018-02-15 DIAGNOSIS — R05 Cough: Secondary | ICD-10-CM

## 2018-02-15 DIAGNOSIS — R059 Cough, unspecified: Secondary | ICD-10-CM

## 2018-02-15 DIAGNOSIS — J0100 Acute maxillary sinusitis, unspecified: Secondary | ICD-10-CM | POA: Diagnosis not present

## 2018-02-15 MED ORDER — SULFAMETHOXAZOLE-TRIMETHOPRIM 800-160 MG PO TABS: 1.0000 | ORAL_TABLET | Freq: Two times a day (BID) | ORAL | 0 refills | Status: DC

## 2018-02-15 MED ORDER — GUAIFENESIN-CODEINE 100-10 MG/5ML PO SYRP: 5.0000 mL | ORAL_SOLUTION | Freq: Three times a day (TID) | ORAL | 0 refills | Status: DC | PRN

## 2018-02-15 NOTE — Progress Notes (Signed)
Name: Yolanda Young   MRN: [id number]    DOB: [birth year]   Date:02/15/2018       Progress Note  Subjective  Chief Complaint  Chief Complaint  Patient presents with  . Sinusitis    started 4 days ago- bad headache, cough with Kollmann production, cong    Sinusitis  This is a new problem. The current episode started in the past 7 days. The problem has been gradually worsening since onset. Maximum temperature: low grade. Her pain is at a severity of 7/10. Associated symptoms include chills, congestion, coughing, diaphoresis, ear pain, headaches, a hoarse voice, sinus pressure, sneezing, a sore throat and swollen glands. Pertinent negatives include no neck pain or shortness of breath. Past treatments include oral decongestants (mucinex). The treatment provided mild relief.    No problem-specific Assessment & Plan notes found for this encounter.   Past Medical History:  Diagnosis Date  . GERD (gastroesophageal reflux disease)   . Migraine    weekly    Past Surgical History:  Procedure Laterality Date  . ABLATION ON ENDOMETRIOSIS    . COLONOSCOPY WITH PROPOFOL N/A 02/04/2018   Procedure: COLONOSCOPY WITH PROPOFOL;  Surgeon: Lucilla Lame, MD;  Location: [place];  Service: Endoscopy;  Laterality: N/A;  . ESOPHAGOGASTRODUODENOSCOPY (EGD) WITH PROPOFOL N/A 02/04/2018   Procedure: ESOPHAGOGASTRODUODENOSCOPY (EGD) WITH PROPOFOL;  Surgeon: Lucilla Lame, MD;  Location: [place];  Service: Endoscopy;  Laterality: N/A;  . SHOULDER SURGERY Right     Family History  Problem Relation Age of Onset  . Diabetes Mother   . Stroke Father   . Cancer Maternal Grandmother   . Cancer Maternal Grandfather   . Diabetes Paternal Grandmother     Social History   Socioeconomic History  . Marital status: Divorced    Spouse name: Not on file  . Number of children: Not on file  . Years of education: Not on file  . Highest education level: Not on file  Social Needs  . Financial  resource strain: Not on file  . Food insecurity - worry: Not on file  . Food insecurity - inability: Not on file  . Transportation needs - medical: Not on file  . Transportation needs - non-medical: Not on file  Occupational History  . Not on file  Tobacco Use  . Smoking status: Former Smoker    Last attempt to quit: 1999    Years since quitting: 20.2  . Smokeless tobacco: Never Used  Substance and Sexual Activity  . Alcohol use: Yes    Alcohol/week: 2.4 oz    Types: 4 Glasses of wine per week  . Drug use: No  . Sexual activity: Yes  Other Topics Concern  . Not on file  Social History Narrative  . Not on file    Allergies  Allergen Reactions  . Erythromycin Rash  . Penicillins Rash    Outpatient Medications Prior to Visit  Medication Sig Dispense Refill  . pantoprazole (PROTONIX) 40 MG tablet Take 1 tablet (40 mg total) by mouth daily. 30 tablet 5  . topiramate (TOPAMAX) 50 MG tablet Take 1 tablet (50 mg total) by mouth daily. 30 tablet 2  . traMADol (ULTRAM) 50 MG tablet Take 1 tablet (50 mg total) by mouth every 6 (six) hours as needed. 20 tablet 0   No facility-administered medications prior to visit.     Review of Systems  Constitutional: Positive for chills and diaphoresis. Negative for fever, malaise/fatigue and weight loss.  HENT: Positive for  congestion, ear pain, hoarse voice, sinus pressure, sneezing and sore throat. Negative for ear discharge.   Eyes: Negative for blurred vision.  Respiratory: Positive for cough. Negative for sputum production, shortness of breath and wheezing.   Cardiovascular: Negative for chest pain, palpitations and leg swelling.  Gastrointestinal: Negative for abdominal pain, blood in stool, constipation, diarrhea, heartburn, melena and nausea.  Genitourinary: Negative for dysuria, frequency, hematuria and urgency.  Musculoskeletal: Negative for back pain, joint pain, myalgias and neck pain.  Skin: Negative for rash.  Neurological:  Positive for headaches. Negative for dizziness, tingling, sensory change and focal weakness.  Endo/Heme/Allergies: Negative for environmental allergies and polydipsia. Does not bruise/bleed easily.  Psychiatric/Behavioral: Negative for depression and suicidal ideas. The patient is not nervous/anxious and does not have insomnia.      Objective  Vitals:   02/15/18 1112  BP: 110/80  Pulse: 64  Temp: 98 F (36.7 C)  TempSrc: Oral  Weight: 224 lb (101.6 kg)  Height: 5\' 8"  (1.727 m)    Physical Exam  Constitutional: She is well-developed, well-nourished, and in no distress. No distress.  HENT:  Head: Normocephalic and atraumatic.  Right Ear: Hearing, tympanic membrane, external ear and ear canal normal.  Left Ear: Hearing, external ear and ear canal normal. A middle ear effusion is present.  Nose: Mucosal edema and rhinorrhea present. Right sinus exhibits maxillary sinus tenderness and frontal sinus tenderness. Left sinus exhibits maxillary sinus tenderness and frontal sinus tenderness.  Mouth/Throat: Posterior oropharyngeal erythema present. No oropharyngeal exudate or posterior oropharyngeal edema.  Eyes: Conjunctivae and EOM are normal. Pupils are equal, round, and reactive to light. Right eye exhibits no discharge. Left eye exhibits no discharge.  Neck: Normal range of motion. Neck supple. No JVD present. No thyromegaly present.  Cardiovascular: Normal rate, regular rhythm, normal heart sounds and intact distal pulses. Exam reveals no gallop and no friction rub.  No murmur heard. Pulmonary/Chest: Effort normal and breath sounds normal. She has no wheezes. She has no rales.  Abdominal: Soft. Bowel sounds are normal. She exhibits no mass. There is no tenderness. There is no guarding.  Musculoskeletal: Normal range of motion. She exhibits no edema.  Lymphadenopathy:       Head (right side): Submandibular adenopathy present.       Head (left side): Submandibular adenopathy present.     She has no cervical adenopathy.    She has no axillary adenopathy.  tender  Neurological: She is alert. She has normal reflexes.  Skin: Skin is warm and dry. She is not diaphoretic.  Psychiatric: Mood and affect normal.  Nursing note and vitals reviewed.     Assessment & Plan  Problem List Items Addressed This Visit    None    Visit Diagnoses    Acute maxillary sinusitis, recurrence not specified    -  Primary   Relevant Medications   sulfamethoxazole-trimethoprim (BACTRIM DS,SEPTRA DS) 800-160 MG tablet   guaiFENesin-codeine (ROBITUSSIN AC) 100-10 MG/5ML syrup   Cough       Relevant Medications   guaiFENesin-codeine (ROBITUSSIN AC) 100-10 MG/5ML syrup      Meds ordered this encounter  Medications  . sulfamethoxazole-trimethoprim (BACTRIM DS,SEPTRA DS) 800-160 MG tablet    Sig: Take 1 tablet by mouth 2 (two) times daily.    Dispense:  20 tablet    Refill:  0  . guaiFENesin-codeine (ROBITUSSIN AC) 100-10 MG/5ML syrup    Sig: Take 5 mLs by mouth 3 (three) times daily as needed for cough.  Dispense:  150 mL    Refill:  0      Dr. Otilio Miu [facility]  02/15/18

## 2018-03-02 ENCOUNTER — Encounter: Payer: Self-pay | Admitting: General Surgery

## 2018-03-02 ENCOUNTER — Ambulatory Visit: Payer: BLUE CROSS/BLUE SHIELD | Admitting: General Surgery

## 2018-03-02 VITALS — BP 124/82 | HR 87 | Resp 12 | Ht 68.0 in | Wt 227.0 lb

## 2018-03-02 DIAGNOSIS — R1010 Upper abdominal pain, unspecified: Secondary | ICD-10-CM | POA: Diagnosis not present

## 2018-03-02 MED ORDER — DICYCLOMINE HCL 20 MG PO TABS: 20.0000 mg | ORAL_TABLET | Freq: Three times a day (TID) | ORAL | 0 refills | Status: DC

## 2018-03-02 NOTE — Patient Instructions (Addendum)
The patient is aware to call back for any questions or concerns.  Suggest a trial of Bentyl 20 mg 4 times a day for 10 days prior to deciding on surgery, send a MyChart message with status update.  Laparoscopic Cholecystectomy Laparoscopic cholecystectomy is surgery to remove the gallbladder. The gallbladder is a pear-shaped organ that lies beneath the liver on the right side of the body. The gallbladder stores bile, which is a fluid that helps the body to digest fats. Cholecystectomy is often done for inflammation of the gallbladder (cholecystitis). This condition is usually caused by a buildup of gallstones (cholelithiasis) in the gallbladder. Gallstones can block the flow of bile, which can result in inflammation and pain. In severe cases, emergency surgery may be required. This procedure is done though small incisions in your abdomen (laparoscopic surgery). A thin scope with a camera (laparoscope) is inserted through one incision. Thin surgical instruments are inserted through the other incisions. In some cases, a laparoscopic procedure may be turned into a type of surgery that is done through a larger incision (open surgery). Tell a health care provider about:  Any allergies you have.  All medicines you are taking, including vitamins, herbs, eye drops, creams, and over-the-counter medicines.  Any problems you or family members have had with anesthetic medicines.  Any blood disorders you have.  Any surgeries you have had.  Any medical conditions you have.  Whether you are pregnant or may be pregnant. What are the risks? Generally, this is a safe procedure. However, problems may occur, including:  Infection.  Bleeding.  Allergic reactions to medicines.  Damage to other structures or organs.  A stone remaining in the common bile duct. The common bile duct carries bile from the gallbladder into the small intestine.  A bile leak from the cyst duct that is clipped when your  gallbladder is removed.  What happens before the procedure? Staying hydrated Follow instructions from your health care provider about hydration, which may include:  Up to 2 hours before the procedure - you may continue to drink clear liquids, such as water, clear fruit juice, black coffee, and plain tea.  Eating and drinking restrictions Follow instructions from your health care provider about eating and drinking, which may include:  8 hours before the procedure - stop eating heavy meals or foods such as meat, fried foods, or fatty foods.  6 hours before the procedure - stop eating light meals or foods, such as toast or cereal.  6 hours before the procedure - stop drinking milk or drinks that contain milk.  2 hours before the procedure - stop drinking clear liquids.  Medicines  Ask your health care provider about: ? Changing or stopping your regular medicines. This is especially important if you are taking diabetes medicines or blood thinners. ? Taking medicines such as aspirin and ibuprofen. These medicines can thin your blood. Do not take these medicines before your procedure if your health care provider instructs you not to.  You may be given antibiotic medicine to help prevent infection. General instructions  Let your health care provider know if you develop a cold or an infection before surgery.  Plan to have someone take you home from the hospital or clinic.  Ask your health care provider how your surgical site will be marked or identified. What happens during the procedure?  To reduce your risk of infection: ? Your health care team will wash or sanitize their hands. ? Your skin will be washed with soap. ?  Hair may be removed from the surgical area.  An IV tube may be inserted into one of your veins.  You will be given one or more of the following: ? A medicine to help you relax (sedative). ? A medicine to make you fall asleep (general anesthetic).  A breathing  tube will be placed in your mouth.  Your surgeon will make several small cuts (incisions) in your abdomen.  The laparoscope will be inserted through one of the small incisions. The camera on the laparoscope will send images to a TV screen (monitor) in the operating room. This lets your surgeon see inside your abdomen.  Air-like gas will be pumped into your abdomen. This will expand your abdomen to give the surgeon more room to perform the surgery.  Other tools that are needed for the procedure will be inserted through the other incisions. The gallbladder will be removed through one of the incisions.  Your common bile duct may be examined. If stones are found in the common bile duct, they may be removed.  After your gallbladder has been removed, the incisions will be closed with stitches (sutures), staples, or skin glue.  Your incisions may be covered with a bandage (dressing). The procedure may vary among health care providers and hospitals. What happens after the procedure?  Your blood pressure, heart rate, breathing rate, and blood oxygen level will be monitored until the medicines you were given have worn off.  You will be given medicines as needed to control your pain.  Do not drive for 24 hours if you were given a sedative. This information is not intended to replace advice given to you by your health care provider. Make sure you discuss any questions you have with your health care provider. Document Released: 11/24/2005 Document Revised: 06/15/2016 Document Reviewed: 05/12/2016 Elsevier Interactive Patient Education  2018 [employer].

## 2018-03-02 NOTE — Progress Notes (Signed)
Patient ID: Mesa Janus, female   DOB: 01/09/1966, 52 y.o.   MRN: [id number]  Chief Complaint  Patient presents with  . Abdominal Pain    HPI Anari Evitt is a 52 y.o. female.  Here today for evaluation of upper mid abdominal pain referred by Dr Allen Norris. She states the pain started over one month ago. She states the pain was sharp "taking her breath", lasting 2 hours and radiating through to her back. She states the pain now is intermittent associated with nausea. She states the pain wakes her up at night. This is worse with fried foods and red meat. She now notices vomiting and diarrhea. She does admit to feeling "pressure" as well. Not infrequently the patient will be awakened from sleep at 2-2:30 in the morning with pain followed by nausea. She states that 5 years ago she had an episode while in [place] and knew it was her gallbladder but did not have the surgery. Denies weight loss. CT 09-24-18 and HIDA with ultrasound done 02-05-18.  She works in [employer] for [employer].  HPI  Past Medical History:  Diagnosis Date  . Colon polyp 02/04/2018   tubular adenoma  . GERD (gastroesophageal reflux disease)   . Migraine    weekly    Past Surgical History:  Procedure Laterality Date  . ABLATION ON ENDOMETRIOSIS  2013   [place]  . COLONOSCOPY WITH PROPOFOL N/A 02/04/2018   Procedure: COLONOSCOPY WITH PROPOFOL;  Surgeon: Lucilla Lame, MD;  Location: [place];  Service: Endoscopy;  Laterality: N/A;  . ESOPHAGOGASTRODUODENOSCOPY (EGD) WITH PROPOFOL N/A 02/04/2018   Procedure: ESOPHAGOGASTRODUODENOSCOPY (EGD) WITH PROPOFOL;  Surgeon: Lucilla Lame, MD;  Location: [place];  Service: Endoscopy;  Laterality: N/A;  . SHOULDER SURGERY Right     Family History  Problem Relation Age of Onset  . Diabetes Mother   . Stroke Father   . Crohn's disease Father   . Cancer Maternal Grandmother   . Cancer Maternal Grandfather   . Diabetes Paternal Grandmother     Social  History Social History   Tobacco Use  . Smoking status: Former Smoker    Last attempt to quit: 1999    Years since quitting: 20.2  . Smokeless tobacco: Never Used  Substance Use Topics  . Alcohol use: Yes    Alcohol/week: 2.4 oz    Types: 4 Glasses of wine per week  . Drug use: No    Allergies  Allergen Reactions  . Erythromycin Rash  . Penicillins Rash    Current Outpatient Medications  Medication Sig Dispense Refill  . pantoprazole (PROTONIX) 40 MG tablet Take 1 tablet (40 mg total) by mouth daily. 30 tablet 5  . topiramate (TOPAMAX) 50 MG tablet Take 1 tablet (50 mg total) by mouth daily. 30 tablet 2  . traMADol (ULTRAM) 50 MG tablet Take 1 tablet (50 mg total) by mouth every 6 (six) hours as needed. 20 tablet 0  . dicyclomine (BENTYL) 20 MG tablet Take 1 tablet (20 mg total) by mouth 4 (four) times daily -  before meals and at bedtime for 10 days. 40 tablet 0   No current facility-administered medications for this visit.     Review of Systems Review of Systems  Constitutional: Negative.   Respiratory: Negative.   Cardiovascular: Negative.   Gastrointestinal: Positive for abdominal pain, diarrhea, nausea and vomiting.    Blood pressure 124/82, pulse 87, resp. rate 12, height 5\' 8"  (1.727 m), weight 227 lb (103 kg), SpO2  96 %.  Physical Exam Physical Exam  Constitutional: She is oriented to person, place, and time. She appears well-developed and well-nourished.  HENT:  Mouth/Throat: Oropharynx is clear and moist.  Eyes: Conjunctivae are normal. No scleral icterus.  Neck: Neck supple.  Cardiovascular: Normal rate, regular rhythm and normal heart sounds.  Pulmonary/Chest: Effort normal and breath sounds normal.  Abdominal: Soft. Normal appearance and bowel sounds are normal. There is tenderness in the right upper quadrant and epigastric area.    Lymphadenopathy:    She has no cervical adenopathy.  Neurological: She is alert and oriented to person, place, and  time.  Skin: Skin is warm and dry.  Psychiatric: Her behavior is normal.    Data Reviewed CT of the abdomen and pelvis dated September 24, 2017 was reviewed.  Study completed for left lower quadrant pain.  No intra-abdominal pathology identified. Ultrasound of the abdomen of February 05, 2018 reviewed.  No gallbladder wall thickening, pericholecystic fluid or gallstones. HIDA scan of February 05, 2018 reviewed.  Ejection fraction of 33% with unsure.  Modest reproduction of symptoms. Upper endoscopy of February 04, 2018 showed biopsy of the gastric antrum showing chronic inactive gastritis.  Negative for H. pylori.  Descending colon polyp showed a tubular adenoma.  Rectal polyp was hyperplastic.  Assessment    Clinical symptoms suggestive of biliary colic in spite of inconclusive imaging studies.    Plan      The patient is advised that at this time there is no hard and evidence of elective cholecystectomy would relieve her symptoms.  Her report of nocturnal awakening, worsening symptoms with fatty foods and the location of her pain does point to biliary colic is a possibility.  Before considering cholecystectomy, I think it is reasonable to make use of a trial of Bentyl.  Suggest a trial of Bentyl 20 mg 4 times a day for 10 days prior to deciding on surgery, send a MyChart message with status update.    Laparoscopic Cholecystectomy with Intraoperative Cholangiogram. The procedure, including it's potential risks and complications (including but not limited to infection, bleeding, injury to intra-abdominal organs or bile ducts, bile leak, poor cosmetic result, sepsis and death) were discussed with the patient in detail. Non-operative options, including their inherent risks (acute calculous cholecystitis with possible choledocholithiasis or gallstone pancreatitis, with the risk of ascending cholangitis, sepsis, and death) were discussed as well. The patient expressed and understanding of what we discussed  and wishes to proceed with laparoscopic cholecystectomy. The patient further understands that if it is technically not possible, or it is unsafe to proceed laparoscopically, that I will convert to an open cholecystectomy.      HPI, Physical Exam, Assessment and Plan have been scribed under the direction and in the presence of Robert Bellow, MD. Karie Fetch, RN  I have completed the exam and reviewed the above documentation for accuracy and completeness.  I agree with the above.  Haematologist has been used and any errors in dictation or transcription are unintentional.  Hervey Ard, M.D., F.A.C.S.  [name] Mclane Arora 03/03/2018, 8:09 PM

## 2018-03-16 ENCOUNTER — Encounter: Payer: Self-pay | Admitting: General Surgery

## 2018-03-17 ENCOUNTER — Telehealth: Payer: Self-pay | Admitting: *Deleted

## 2018-03-17 NOTE — Telephone Encounter (Signed)
Patient was contacted today to arrange gallbladder surgery.   The patient was offered to have surgery on 03-22-18 but she declined. Surgery to be scheduled for Monday, 04-05-18 at [facility].   Day of surgery instructions were reviewed by phone and will be mailed to the patient.   The patient is aware to contact the office if she has further questions. She verbalizes understanding.

## 2018-03-22 ENCOUNTER — Telehealth: Payer: Self-pay | Admitting: General Surgery

## 2018-03-22 ENCOUNTER — Other Ambulatory Visit: Payer: Self-pay | Admitting: General Surgery

## 2018-03-22 NOTE — Telephone Encounter (Signed)
I called patient's cell # & l/m for her to call back with the phone # on the back side of her [place] card for the prior authorization.When you get the # please give it to [facility].I have the surgery sheet so get that as well with the # and give it to her.

## 2018-03-22 NOTE — Progress Notes (Signed)
Patient made use of a trial as requested with no improvement in her chronic right upper quadrant symptoms.  We have elected to proceed cholecystectomy.

## 2018-03-29 ENCOUNTER — Encounter
Admission: RE | Admit: 2018-03-29 | Discharge: 2018-03-29 | Disposition: A | Discharge status: Home / Self Care | Payer: BLUE CROSS/BLUE SHIELD | Source: Ambulatory Visit | Attending: General Surgery | Admitting: General Surgery

## 2018-03-29 ENCOUNTER — Other Ambulatory Visit: Payer: Self-pay

## 2018-03-29 HISTORY — DX: Panic disorder (episodic paroxysmal anxiety): F41.0

## 2018-03-29 HISTORY — DX: Personal history of other diseases of the digestive system: Z87.19

## 2018-03-29 NOTE — Patient Instructions (Addendum)
Your procedure is scheduled on: 04-05-18 Report to Same Day Surgery 2nd floor medical mall [facility] Entrance-take elevator on left to 2nd floor.  Check in with surgery information desk.) To find out your arrival time please call [phone] between 1PM - 3PM on 04-02-18  Remember: Instructions that are not followed completely may result in serious medical risk, up to and including death, or upon the discretion of your surgeon and anesthesiologist your surgery may need to be rescheduled.    _x___ 1. Do not eat food after midnight the night before your procedure. You may drink clear liquids up to 2 hours before you are scheduled to arrive at the hospital for your procedure.  Do not drink clear liquids within 2 hours of your scheduled arrival to the hospital.  Clear liquids include  --Water or Apple juice without pulp  --Clear carbohydrate beverage such as ClearFast or Gatorade  --Black Coffee or Clear Tea (No milk, no creamers, do not add anything to  the coffee or Tea .  No gum chewing or hard candies.     __x__ 2. No Alcohol for 24 hours before or after surgery.   __x__3. No Smoking or e-cigarettes for 24 prior to surgery.  Do not use any chewable tobacco products for at least 6 hour prior to surgery   ____  4. Bring all medications with you on the day of surgery if instructed.    __x__ 5. Notify your doctor if there is any change in your medical condition     (cold, fever, infections).    x___6. On the morning of surgery brush your teeth with toothpaste and water.  You may rinse your mouth with mouth wash if you wish.  Do not swallow any toothpaste or mouthwash.   Do not wear jewelry, make-up, hairpins, clips or nail polish.  Do not wear lotions, powders, or perfumes. You may wear deodorant.  Do not shave 48 hours prior to surgery. Men may shave face and neck.  Do not bring valuables to the hospital.    [facility] is not responsible for any belongings or valuables.        Contacts, dentures or bridgework may not be worn into surgery.  Leave your suitcase in the car. After surgery it may be brought to your room.  For patients admitted to the hospital, discharge time is determined by your                       treatment team.  _  Patients discharged the day of surgery will not be allowed to drive home.  You will need someone to drive you home and stay with you the night of your procedure.    Please read over the following fact sheets that you were given:   [facility] Preparing for Surgery and or MRSA Information   _x___ Take anti-hypertensive listed below, cardiac, seizure, asthma, anti-reflux and psychiatric medicines. These include:  1. PROTONIX  2. TAKE AN EXTRA PROTONIX THE NIGHT BEFORE YOUR SURGERY  3.  4.  5.  6.  ____Fleets enema or Magnesium Citrate as directed.   ____ Use CHG Soap or sage wipes as directed on instruction sheet   ____ Use inhalers on the day of surgery and bring to hospital day of surgery  ____ Stop Metformin and Janumet 2 days prior to surgery.    ____ Take 1/2 of usual insulin dose the night before surgery and none on the morning  surgery.   ____ Follow recommendations from Cardiologist, Pulmonologist or PCP regarding          stopping Aspirin, Coumadin, Plavix ,Eliquis, Effient, or Pradaxa, and Pletal.  ____Stop Anti-inflammatories such as Advil, Aleve, Ibuprofen, Motrin, Naproxen, Naprosyn, Goodies powders or aspirin products. OK to take Tylenol OR TRAMADOL   ____ Stop supplements until after surgery   ____ Bring C-Pap to the hospital.

## 2018-04-05 ENCOUNTER — Encounter
Admission: RE | Disposition: A | Discharge status: Home / Self Care | Payer: Self-pay | Source: Ambulatory Visit | Attending: General Surgery

## 2018-04-05 ENCOUNTER — Ambulatory Visit: Payer: BLUE CROSS/BLUE SHIELD | Admitting: Certified Registered"

## 2018-04-05 ENCOUNTER — Ambulatory Visit: Payer: BLUE CROSS/BLUE SHIELD

## 2018-04-05 ENCOUNTER — Encounter: Payer: Self-pay | Admitting: Certified Registered"

## 2018-04-05 ENCOUNTER — Ambulatory Visit
Admission: RE | Admit: 2018-04-05 | Discharge: 2018-04-05 | Disposition: A | Discharge status: Home / Self Care | Payer: BLUE CROSS/BLUE SHIELD | Source: Ambulatory Visit | Attending: General Surgery | Admitting: General Surgery

## 2018-04-05 DIAGNOSIS — Z881 Allergy status to other antibiotic agents status: Secondary | ICD-10-CM | POA: Insufficient documentation

## 2018-04-05 DIAGNOSIS — K811 Chronic cholecystitis: Secondary | ICD-10-CM | POA: Diagnosis not present

## 2018-04-05 DIAGNOSIS — Z86010 Personal history of colonic polyps: Secondary | ICD-10-CM | POA: Diagnosis not present

## 2018-04-05 DIAGNOSIS — Z87891 Personal history of nicotine dependence: Secondary | ICD-10-CM | POA: Insufficient documentation

## 2018-04-05 DIAGNOSIS — Z880 Allergy status to penicillin: Secondary | ICD-10-CM | POA: Diagnosis not present

## 2018-04-05 DIAGNOSIS — K219 Gastro-esophageal reflux disease without esophagitis: Secondary | ICD-10-CM | POA: Diagnosis not present

## 2018-04-05 DIAGNOSIS — F419 Anxiety disorder, unspecified: Secondary | ICD-10-CM | POA: Insufficient documentation

## 2018-04-05 DIAGNOSIS — K8020 Calculus of gallbladder without cholecystitis without obstruction: Secondary | ICD-10-CM

## 2018-04-05 HISTORY — PX: CHOLECYSTECTOMY: SHX55

## 2018-04-05 IMAGING — XA DG CHOLANGIOGRAM OPERATIVE
2 series · 7 of 7 positions shown · non-contrast
Comparison: Abdominal ultrasound [DATE]; nuclear medicine HIDA
scan [DATE]

CLINICAL DATA: 51-year-old female with cholelithiasis undergoing
laparoscopic cholecystectomy

EXAM:
INTRAOPERATIVE CHOLANGIOGRAM
TECHNIQUE: Cholangiographic images from the C-arm fluoroscopic device were
submitted for interpretation post-operatively. Please see the
procedural report for the amount of contrast and the fluoroscopy
time utilized.

[Series 2: cont. · 3 of 41 frames shown (1 of 2)]
[frame 7/41]
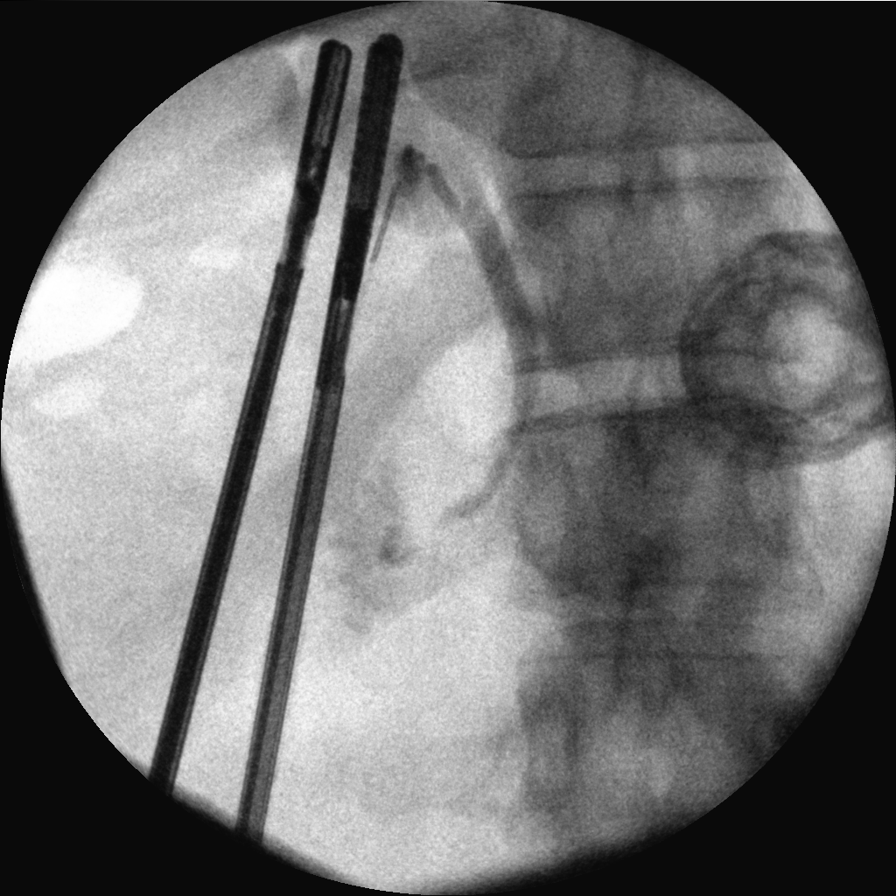
[frame 21/41]
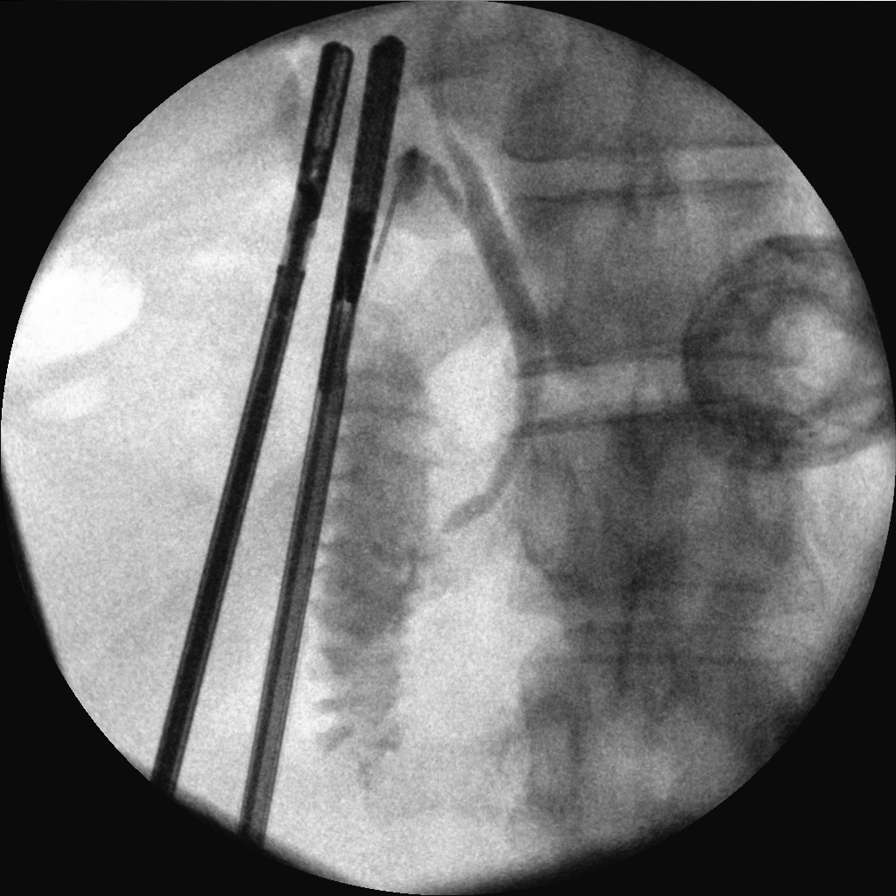
[frame 35/41]
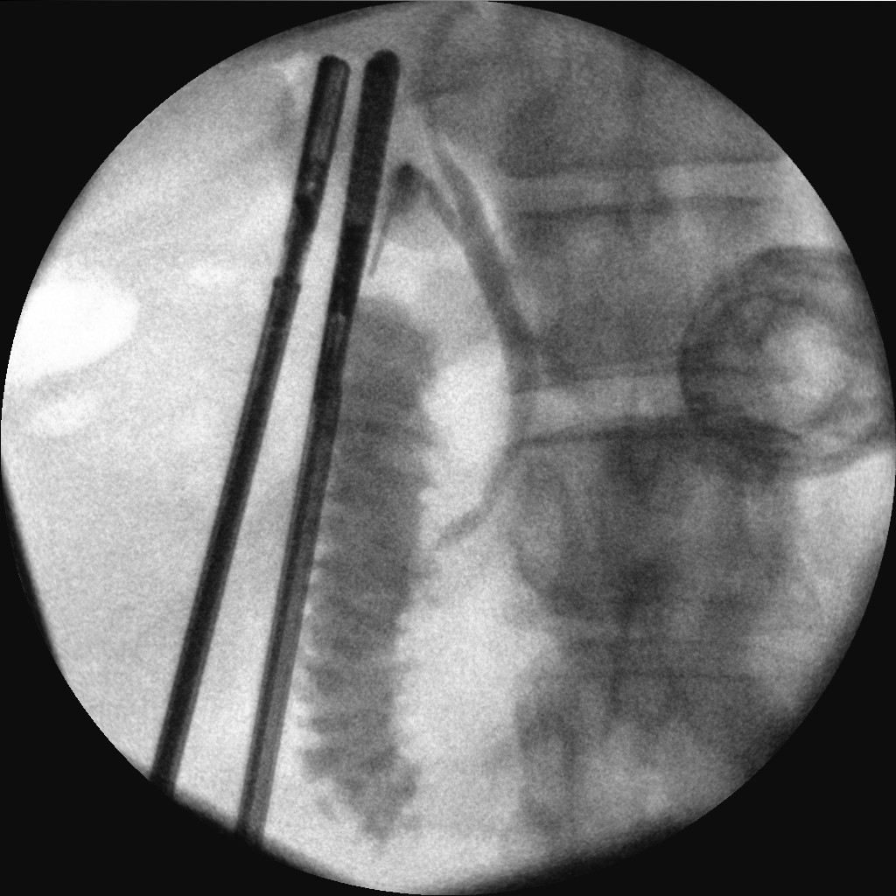

[Series 3: cont. · 4 of 24 frames shown (2 of 2)]
[frame 4/24]
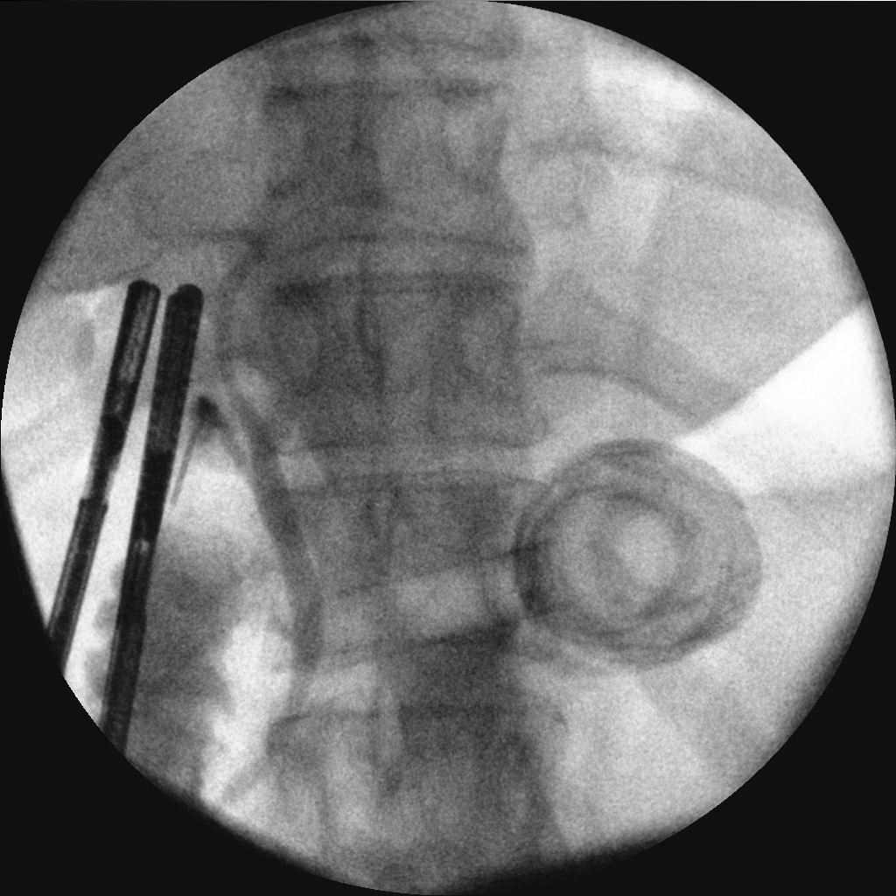
[frame 12/24]
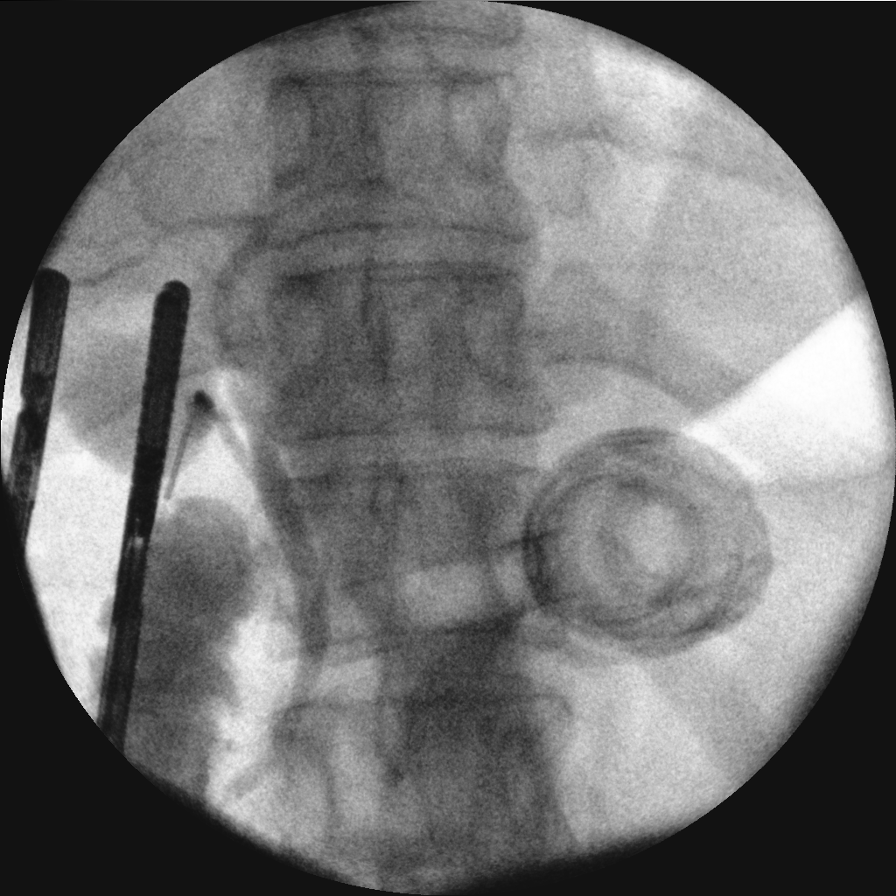
[frame 13/24]
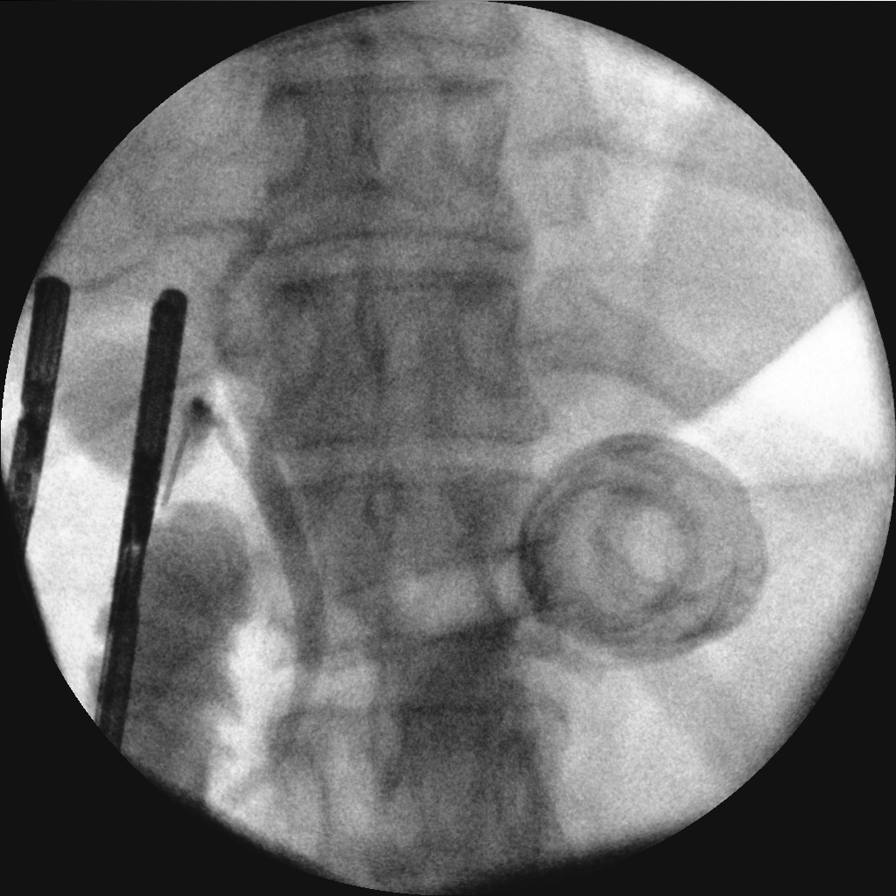
[frame 21/24]
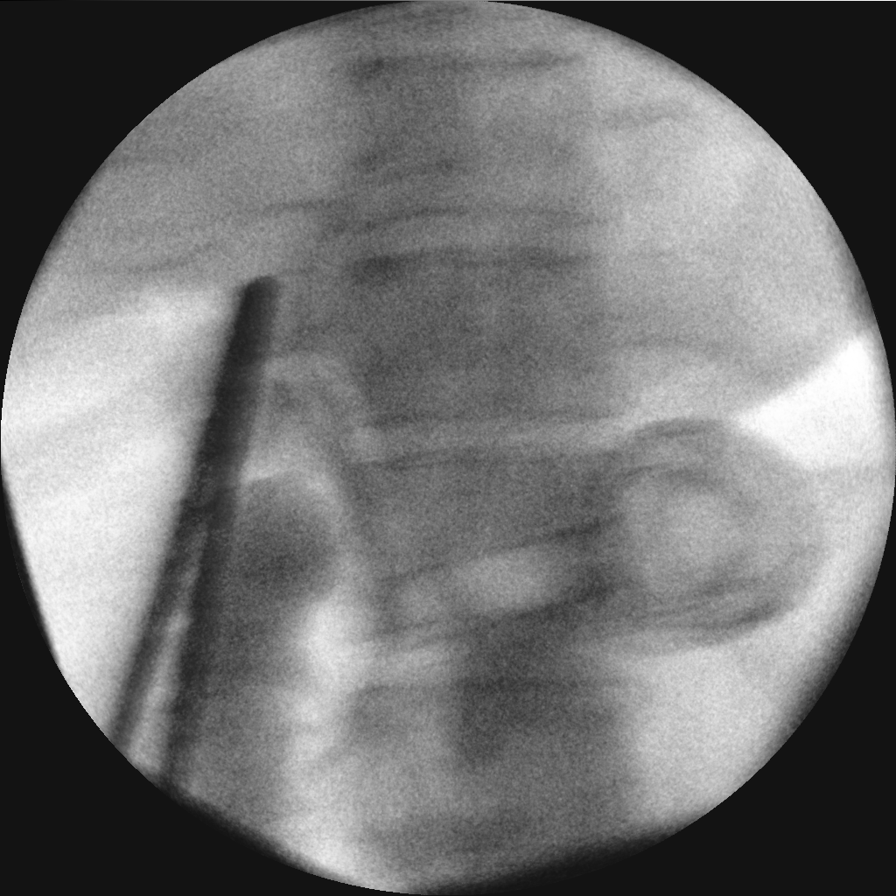

[7 of 7 positions shown; findings below may reference images not displayed]

FINDINGS: A cine loop obtained at the time of intraoperative cholangiogram
during laparoscopic cholecystectomy is submitted for review. The
images demonstrate cannulation of the cystic duct remanent and
opacification of the biliary tree. No evidence of biliary ductal
dilatation, stenosis, stricture or choledocholithiasis. Contrast
material passes through the ampulla and into the duodenum.
IMPRESSION: Negative intraoperative cholangiogram.

## 2018-04-05 SURGERY — LAPAROSCOPIC CHOLECYSTECTOMY WITH INTRAOPERATIVE CHOLANGIOGRAM
Anesthesia: General | Site: Abdomen | Wound class: Clean Contaminated

## 2018-04-05 MED ORDER — PROPOFOL 10 MG/ML IV BOLUS
INTRAVENOUS | Status: DC | PRN
  Administered 2018-04-05: 150 mg via INTRAVENOUS

## 2018-04-05 MED ORDER — ACETAMINOPHEN 10 MG/ML IV SOLN
INTRAVENOUS | Status: AC
  Filled 2018-04-05: qty 100

## 2018-04-05 MED ORDER — FENTANYL CITRATE (PF) 100 MCG/2ML IJ SOLN
25.0000 ug | INTRAMUSCULAR | Status: DC | PRN
  Administered 2018-04-05: 50 ug via INTRAVENOUS
  Administered 2018-04-05 (×2): 25 ug via INTRAVENOUS

## 2018-04-05 MED ORDER — FENTANYL CITRATE (PF) 100 MCG/2ML IJ SOLN
INTRAMUSCULAR | Status: AC
  Filled 2018-04-05: qty 2

## 2018-04-05 MED ORDER — DEXAMETHASONE SODIUM PHOSPHATE 10 MG/ML IJ SOLN
INTRAMUSCULAR | Status: AC
  Filled 2018-04-05: qty 1

## 2018-04-05 MED ORDER — EPHEDRINE SULFATE 50 MG/ML IJ SOLN
INTRAMUSCULAR | Status: DC | PRN
  Administered 2018-04-05: 10 mg via INTRAVENOUS

## 2018-04-05 MED ORDER — MIDAZOLAM HCL 2 MG/2ML IJ SOLN
INTRAMUSCULAR | Status: AC
  Filled 2018-04-05: qty 4

## 2018-04-05 MED ORDER — KETOROLAC TROMETHAMINE 30 MG/ML IJ SOLN
INTRAMUSCULAR | Status: AC
Start: 2018-04-05 — End: 2018-04-05
  Filled 2018-04-05: qty 1

## 2018-04-05 MED ORDER — ONDANSETRON HCL 4 MG/2ML IJ SOLN
INTRAMUSCULAR | Status: DC | PRN
  Administered 2018-04-05: 4 mg via INTRAVENOUS

## 2018-04-05 MED ORDER — ROCURONIUM BROMIDE 50 MG/5ML IV SOLN
INTRAVENOUS | Status: AC
  Filled 2018-04-05: qty 1

## 2018-04-05 MED ORDER — MIDAZOLAM HCL 2 MG/2ML IJ SOLN
INTRAMUSCULAR | Status: DC | PRN
  Administered 2018-04-05: 2 mg via INTRAVENOUS

## 2018-04-05 MED ORDER — PROMETHAZINE HCL 25 MG/ML IJ SOLN
INTRAMUSCULAR | Status: AC
  Administered 2018-04-05: 6.25 mg via INTRAVENOUS
  Filled 2018-04-05: qty 1

## 2018-04-05 MED ORDER — LIDOCAINE HCL (PF) 2 % IJ SOLN
INTRAMUSCULAR | Status: AC
  Filled 2018-04-05: qty 10

## 2018-04-05 MED ORDER — LIDOCAINE HCL (CARDIAC) PF 100 MG/5ML IV SOSY
PREFILLED_SYRINGE | INTRAVENOUS | Status: DC | PRN
  Administered 2018-04-05: 50 mg via INTRAVENOUS

## 2018-04-05 MED ORDER — KETOROLAC TROMETHAMINE 30 MG/ML IJ SOLN
INTRAMUSCULAR | Status: DC | PRN
  Administered 2018-04-05: 30 mg via INTRAVENOUS

## 2018-04-05 MED ORDER — FENTANYL CITRATE (PF) 100 MCG/2ML IJ SOLN
INTRAMUSCULAR | Status: AC
  Administered 2018-04-05: 25 ug via INTRAVENOUS
  Filled 2018-04-05: qty 2

## 2018-04-05 MED ORDER — ROCURONIUM BROMIDE 100 MG/10ML IV SOLN
INTRAVENOUS | Status: DC | PRN
  Administered 2018-04-05: 40 mg via INTRAVENOUS
  Administered 2018-04-05: 20 mg via INTRAVENOUS

## 2018-04-05 MED ORDER — ACETAMINOPHEN 10 MG/ML IV SOLN
INTRAVENOUS | Status: DC | PRN
  Administered 2018-04-05: 1000 mg via INTRAVENOUS

## 2018-04-05 MED ORDER — HYDROCODONE-ACETAMINOPHEN 5-325 MG PO TABS: 1.0000 | ORAL_TABLET | ORAL | 0 refills | Status: AC | PRN

## 2018-04-05 MED ORDER — SODIUM CHLORIDE 0.9 % IJ SOLN
INTRAMUSCULAR | Status: AC
  Filled 2018-04-05: qty 50

## 2018-04-05 MED ORDER — SODIUM CHLORIDE 0.9 % IV SOLN
INTRAVENOUS | Status: DC | PRN
  Administered 2018-04-05: 7 mL

## 2018-04-05 MED ORDER — SUGAMMADEX SODIUM 200 MG/2ML IV SOLN
INTRAVENOUS | Status: DC | PRN
  Administered 2018-04-05: 200 mg via INTRAVENOUS

## 2018-04-05 MED ORDER — PROMETHAZINE HCL 25 MG/ML IJ SOLN
6.2500 mg | INTRAMUSCULAR | Status: DC | PRN
  Administered 2018-04-05: 6.25 mg via INTRAVENOUS

## 2018-04-05 MED ORDER — ONDANSETRON HCL 4 MG/2ML IJ SOLN
INTRAMUSCULAR | Status: AC
  Filled 2018-04-05: qty 2

## 2018-04-05 MED ORDER — EPHEDRINE SULFATE 50 MG/ML IJ SOLN
INTRAMUSCULAR | Status: AC
  Filled 2018-04-05: qty 1

## 2018-04-05 MED ORDER — HYDROMORPHONE HCL 1 MG/ML IJ SOLN
INTRAMUSCULAR | Status: AC
  Filled 2018-04-05: qty 1

## 2018-04-05 MED ORDER — SUGAMMADEX SODIUM 200 MG/2ML IV SOLN
INTRAVENOUS | Status: AC
  Filled 2018-04-05: qty 2

## 2018-04-05 MED ORDER — DEXAMETHASONE SODIUM PHOSPHATE 10 MG/ML IJ SOLN
INTRAMUSCULAR | Status: DC | PRN
  Administered 2018-04-05: 10 mg via INTRAVENOUS

## 2018-04-05 MED ORDER — LACTATED RINGERS IV SOLN
INTRAVENOUS | Status: DC
  Administered 2018-04-05: 09:00:00 via INTRAVENOUS

## 2018-04-05 MED ORDER — MIDAZOLAM HCL 2 MG/2ML IJ SOLN
INTRAMUSCULAR | Status: AC
  Filled 2018-04-05: qty 2

## 2018-04-05 MED ORDER — SODIUM CHLORIDE 0.9 % IJ SOLN
INTRAMUSCULAR | Status: AC
  Filled 2018-04-05: qty 10

## 2018-04-05 MED ORDER — PROPOFOL 10 MG/ML IV BOLUS
INTRAVENOUS | Status: AC
  Filled 2018-04-05: qty 20

## 2018-04-05 MED ORDER — FENTANYL CITRATE (PF) 100 MCG/2ML IJ SOLN
INTRAMUSCULAR | Status: DC | PRN
  Administered 2018-04-05: 50 ug via INTRAVENOUS
  Administered 2018-04-05: 100 ug via INTRAVENOUS
  Administered 2018-04-05: 50 ug via INTRAVENOUS

## 2018-04-05 SURGICAL SUPPLY — 38 items
APPLIER CLIP ROT 10 11.4 M/L (STAPLE) ×2
BENZOIN TINCTURE PRP APPL 2/3 (GAUZE/BANDAGES/DRESSINGS) ×2 IMPLANT
BLADE SURG 11 STRL SS SAFETY (MISCELLANEOUS) ×2 IMPLANT
CANISTER SUCT 1200ML W/VALVE (MISCELLANEOUS) ×2 IMPLANT
CANNULA DILATOR 10 W/SLV (CANNULA) ×2 IMPLANT
CANNULA DILATOR 5 W/SLV (CANNULA) ×4 IMPLANT
CATH CHOLANG 76X19 KUMAR (CATHETERS) ×2 IMPLANT
CHLORAPREP W/TINT 26ML (MISCELLANEOUS) ×2 IMPLANT
CLIP APPLIE ROT 10 11.4 M/L (STAPLE) ×1 IMPLANT
CONRAY 60ML FOR OR (MISCELLANEOUS) ×2 IMPLANT
DISSECTOR KITTNER STICK (MISCELLANEOUS) ×1 IMPLANT
DISSECTORS/KITTNER STICK (MISCELLANEOUS) ×2
DRAPE SHEET LG 3/4 BI-LAMINATE (DRAPES) ×2 IMPLANT
DRSG TEGADERM 2-3/8X2-3/4 SM (GAUZE/BANDAGES/DRESSINGS) ×2 IMPLANT
DRSG TELFA 4X3 1S NADH ST (GAUZE/BANDAGES/DRESSINGS) ×2 IMPLANT
ELECT REM PT RETURN 9FT ADLT (ELECTROSURGICAL) ×2
ELECTRODE REM PT RTRN 9FT ADLT (ELECTROSURGICAL) ×1 IMPLANT
GLOVE BIO SURGEON STRL SZ7.5 (GLOVE) ×2 IMPLANT
GLOVE INDICATOR 8.0 STRL GRN (GLOVE) ×2 IMPLANT
GOWN STRL REUS W/ TWL LRG LVL3 (GOWN DISPOSABLE) ×3 IMPLANT
GOWN STRL REUS W/TWL LRG LVL3 (GOWN DISPOSABLE) ×3
IRRIGATION STRYKERFLOW (MISCELLANEOUS) ×1 IMPLANT
IRRIGATOR STRYKERFLOW (MISCELLANEOUS) ×2
IV LACTATED RINGERS 1000ML (IV SOLUTION) ×2 IMPLANT
KIT TURNOVER KIT A (KITS) ×2 IMPLANT
LABEL OR SOLS (LABEL) ×2 IMPLANT
NDL INSUFF ACCESS 14 VERSASTEP (NEEDLE) ×2 IMPLANT
NS IRRIG 500ML POUR BTL (IV SOLUTION) ×4 IMPLANT
PACK LAP CHOLECYSTECTOMY (MISCELLANEOUS) ×2 IMPLANT
POUCH SPECIMEN RETRIEVAL 10MM (ENDOMECHANICALS) ×2 IMPLANT
SCISSORS METZENBAUM CVD 33 (INSTRUMENTS) ×2 IMPLANT
STRIP CLOSURE SKIN 1/2X4 (GAUZE/BANDAGES/DRESSINGS) ×2 IMPLANT
SUT VIC AB 0 CT2 27 (SUTURE) ×2 IMPLANT
SUT VIC AB 4-0 FS2 27 (SUTURE) ×2 IMPLANT
SWABSTK COMLB BENZOIN TINCTURE (MISCELLANEOUS) ×2 IMPLANT
TROCAR XCEL NON-BLD 11X100MML (ENDOMECHANICALS) ×2 IMPLANT
TUBING INSUFFLATION (TUBING) ×2 IMPLANT
WATER STERILE IRR 1000ML POUR (IV SOLUTION) ×2 IMPLANT

## 2018-04-05 NOTE — Anesthesia Preprocedure Evaluation (Signed)
Anesthesia Evaluation  Patient identified by MRN, date of birth, ID band Patient awake    Reviewed: Allergy & Precautions, H&P , NPO status , Patient's Chart, lab work & pertinent test results, reviewed documented beta blocker date and time   History of Anesthesia Complications Negative for: history of anesthetic complications  Airway Mallampati: III  TM Distance: >3 FB Neck ROM: full    Dental  (+) Dental Advidsory Given, Teeth Intact   Pulmonary neg pulmonary ROS, former smoker,           Cardiovascular Exercise Tolerance: Good negative cardio ROS       Neuro/Psych  Headaches, neg Seizures PSYCHIATRIC DISORDERS Anxiety    GI/Hepatic Neg liver ROS, hiatal hernia, GERD  ,  Endo/Other  negative endocrine ROS  Renal/GU negative Renal ROS  negative genitourinary   Musculoskeletal   Abdominal   Peds  Hematology negative hematology ROS (+)   Anesthesia Other Findings Past Medical History: 02/04/2018: Colon polyp     Comment:  tubular adenoma No date: GERD (gastroesophageal reflux disease) No date: History of hiatal hernia No date: Migraine     Comment:  weekly No date: Panic attacks   Reproductive/Obstetrics negative OB ROS                             Anesthesia Physical Anesthesia Plan  ASA: II  Anesthesia Plan: General   Post-op Pain Management:    Induction: Intravenous  PONV Risk Score and Plan: 3 and Ondansetron and Dexamethasone  Airway Management Planned: Oral ETT  Additional Equipment:   Intra-op Plan:   Post-operative Plan: Extubation in OR  Informed Consent: I have reviewed the patients History and Physical, chart, labs and discussed the procedure including the risks, benefits and alternatives for the proposed anesthesia with the patient or authorized representative who has indicated his/her understanding and acceptance.   Dental Advisory Given  Plan Discussed  with: Anesthesiologist, CRNA and Surgeon  Anesthesia Plan Comments:         Anesthesia Quick Evaluation

## 2018-04-05 NOTE — Op Note (Signed)
Preoperative diagnosis: Chronic cholecystitis.  Postoperative diagnosis: Same.  Operative procedure: Laparoscopic cholecystectomy with intraoperative cholangiograms.  Operating Surgeon: Hervey Ard, MD.  Anesthesia: General endotracheal.  Estimated blood loss: Less than 5 cc.  Clinical note: This 52 year old woman has had recurrent episodes of right upper quadrant pain.  Previous imaging studies had failed to show evidence of cholelithiasis.  She had a HIDA scan with a modestly low ejection fraction but some reproduction of her symptoms.  She is failed to improve with a course of oral Protonix or Bentyl.  She is felt to be a candidate for cholecystectomy.  Operative note: With the patient under adequate general endotracheal anesthesia the abdomen was cleansed with ChloraPrep and draped.  A varies needle was placed through a trans-umbilical incision with the patient in Trendelenburg position.  After assuring intra-abdominal location with a hanging drop test the abdomen was insufflated with CO2 of 10 mmHg pressure.  A 10 mm Step port was expanded.  Inspection showed no evidence of injury from initial port placement.  The patient was placed in reverse Trendelenburg position and rolled to the left.  An 11 mm XL port was placed to the right of the falciform ligament.  2-5 mm Step ports were then placed in the right lateral abdominal wall.  The gallbladder showed signs of chronic inflammation.  The gallbladder was placed on cephalad traction.  The neck of the gallbladder was cleared.  Fluoroscopic Lander grams are completed using 20 cc of one half strength Conray 60.  This showed prompt filling of the distal duct, free flow into the duodenum and reflux in the both the right and left hepatic ducts.  The cystic duct was doubly clipped and divided.  Cystic artery was treated in a similar fashion.  The gallbladder was removed from liver bed making use of a cautery dissection.  This was then delivered to  the umbilical port site without incident.  The right upper quadrant was irrigated with lactated Ringer solution.  Good hemostasis was noted.  The fascia at the umbilical site was closed with a single 0 Vicryl suture.  Skin incisions were closed with 4-0 Vicryl subarticular sutures.  Benzoin, Steri-Strips, Telfa and Tegaderm dressings were applied.  The patient tolerated the procedure well and was taken to the recovery room in stable condition.

## 2018-04-05 NOTE — H&P (Signed)
Patient for cholecystectomy.  N/V/ D over the weekend. Less pain, but in the epigastrium/ RUQ.  Lungs: Clear.  Cardio: RR.  Plan: Lap chole/ grams.

## 2018-04-05 NOTE — Discharge Instructions (Signed)

## 2018-04-05 NOTE — Anesthesia Post-op Follow-up Note (Signed)
Anesthesia QCDR form completed.        

## 2018-04-05 NOTE — Transfer of Care (Signed)
Immediate Anesthesia Transfer of Care Note  Patient: Emelynn Rance  Procedure(s) Performed: LAPAROSCOPIC CHOLECYSTECTOMY WITH INTRAOPERATIVE CHOLANGIOGRAM (N/A Abdomen)  Patient Location: PACU  Anesthesia Type:General  Level of Consciousness: drowsy and patient cooperative  Airway & Oxygen Therapy: Patient Spontanous Breathing and Patient connected to face mask oxygen  Post-op Assessment: Report given to RN and Post -op Vital signs reviewed and stable  Post vital signs: Reviewed and stable  Last Vitals:  Vitals Value Taken Time  BP 134/69 04/05/2018 12:34 PM  Temp 36.6 C 04/05/2018 12:34 PM  Pulse 77 04/05/2018 12:37 PM  Resp 17 04/05/2018 12:37 PM  SpO2 100 % 04/05/2018 12:37 PM  Vitals shown include unvalidated device data.  Last Pain:  Vitals:   04/05/18 0914  TempSrc: Temporal  PainSc: 2          Complications: No apparent anesthesia complications

## 2018-04-05 NOTE — Anesthesia Procedure Notes (Signed)
Procedure Name: Intubation Date/Time: 04/05/2018 11:19 AM Performed by: Jonna Clark, CRNA Pre-anesthesia Checklist: Patient identified, Patient being monitored, Timeout performed, Emergency Drugs available and Suction available Patient Re-evaluated:Patient Re-evaluated prior to induction Oxygen Delivery Method: Circle system utilized Preoxygenation: Pre-oxygenation with 100% oxygen Induction Type: IV induction Ventilation: Mask ventilation without difficulty Laryngoscope Size: Mac and 3 Grade View: Grade I Tube type: Oral Tube size: 7.0 mm Number of attempts: 1 Placement Confirmation: ETT inserted through vocal cords under direct vision,  positive ETCO2 and breath sounds checked- equal and bilateral Secured at: 21 cm Tube secured with: Tape Dental Injury: Teeth and Oropharynx as per pre-operative assessment

## 2018-04-05 NOTE — Anesthesia Postprocedure Evaluation (Signed)
Anesthesia Post Note  Patient: Yolanda Young  Procedure(s) Performed: LAPAROSCOPIC CHOLECYSTECTOMY WITH INTRAOPERATIVE CHOLANGIOGRAM (N/A Abdomen)  Patient location during evaluation: PACU Anesthesia Type: General Level of consciousness: awake and alert Pain management: pain level controlled Vital Signs Assessment: post-procedure vital signs reviewed and stable Respiratory status: spontaneous breathing, nonlabored ventilation, respiratory function stable and patient connected to nasal cannula oxygen Cardiovascular status: blood pressure returned to baseline and stable Postop Assessment: no apparent nausea or vomiting Anesthetic complications: no     Last Vitals:  Vitals:   04/05/18 1312 04/05/18 1339  BP:  129/68  Pulse: 77 87  Resp: 13 18  Temp: 36.8 C 36.7 C  SpO2: 96% 95%    Last Pain:  Vitals:   04/05/18 1339  TempSrc: Temporal  PainSc: 5                  Martha Clan

## 2018-04-06 LAB — SURGICAL PATHOLOGY

## 2018-04-12 ENCOUNTER — Encounter: Payer: Self-pay | Admitting: Emergency Medicine

## 2018-04-12 ENCOUNTER — Emergency Department: Payer: BLUE CROSS/BLUE SHIELD

## 2018-04-12 ENCOUNTER — Other Ambulatory Visit: Payer: Self-pay

## 2018-04-12 ENCOUNTER — Emergency Department
Admission: EM | Admit: 2018-04-12 | Discharge: 2018-04-12 | Disposition: A | Discharge status: Home / Self Care | Payer: BLUE CROSS/BLUE SHIELD | Attending: Emergency Medicine | Admitting: Emergency Medicine

## 2018-04-12 DIAGNOSIS — R079 Chest pain, unspecified: Secondary | ICD-10-CM | POA: Insufficient documentation

## 2018-04-12 DIAGNOSIS — Z79899 Other long term (current) drug therapy: Secondary | ICD-10-CM | POA: Diagnosis not present

## 2018-04-12 DIAGNOSIS — Z87891 Personal history of nicotine dependence: Secondary | ICD-10-CM | POA: Diagnosis not present

## 2018-04-12 DIAGNOSIS — F419 Anxiety disorder, unspecified: Secondary | ICD-10-CM | POA: Diagnosis not present

## 2018-04-12 LAB — BASIC METABOLIC PANEL
ANION GAP: 10 (ref 5–15)
BUN: 13 mg/dL (ref 6–20)
CALCIUM: 9.1 mg/dL (ref 8.9–10.3)
CO2: 22 mmol/L (ref 22–32)
Chloride: 104 mmol/L (ref 101–111)
Creatinine, Ser: 0.76 mg/dL (ref 0.44–1.00)
GFR calc non Af Amer: >60 mL/min (ref >60)
Glucose, Bld: 107 mg/dL — ABNORMAL HIGH (ref 65–99)
Potassium: 4.4 mmol/L (ref 3.5–5.1)
Sodium: 136 mmol/L (ref 135–145)

## 2018-04-12 LAB — TROPONIN I: Troponin I: <0.03 ng/mL (ref <0.03)

## 2018-04-12 LAB — HEPATIC FUNCTION PANEL
ALK PHOS: 116 U/L (ref 38–126)
ALT: 55 U/L — AB (ref 14–54)
AST: 27 U/L (ref 15–41)
Albumin: 3.9 g/dL (ref 3.5–5.0)
BILIRUBIN DIRECT: 0.1 mg/dL (ref 0.1–0.5)
BILIRUBIN INDIRECT: 1.1 mg/dL — AB (ref 0.3–0.9)
Total Bilirubin: 1.2 mg/dL (ref 0.3–1.2)
Total Protein: 7.2 g/dL (ref 6.5–8.1)

## 2018-04-12 LAB — CBC
HCT: 44 % (ref 35.0–47.0)
HEMOGLOBIN: 15.1 g/dL (ref 12.0–16.0)
MCH: 32.4 pg (ref 26.0–34.0)
MCHC: 34.3 g/dL (ref 32.0–36.0)
MCV: 94.5 fL (ref 80.0–100.0)
Platelets: 304 10*3/uL (ref 150–440)
RBC: 4.66 MIL/uL (ref 3.80–5.20)
RDW: 14.1 % (ref 11.5–14.5)
WBC: 12.1 10*3/uL — ABNORMAL HIGH (ref 3.6–11.0)

## 2018-04-12 LAB — LIPASE, BLOOD: Lipase: 34 U/L (ref 11–51)

## 2018-04-12 IMAGING — CR DG CHEST 2V
1 series · 2 of 2 positions shown · non-contrast
Comparison: None.

CLINICAL DATA: Chest pain radiating to the right, history of hiatal
hernia

EXAM:
CHEST - 2 VIEW

[Series 1: dg chest 2 view · 0.14mm/px · 2 of 2 slices shown]
[im 1/2]
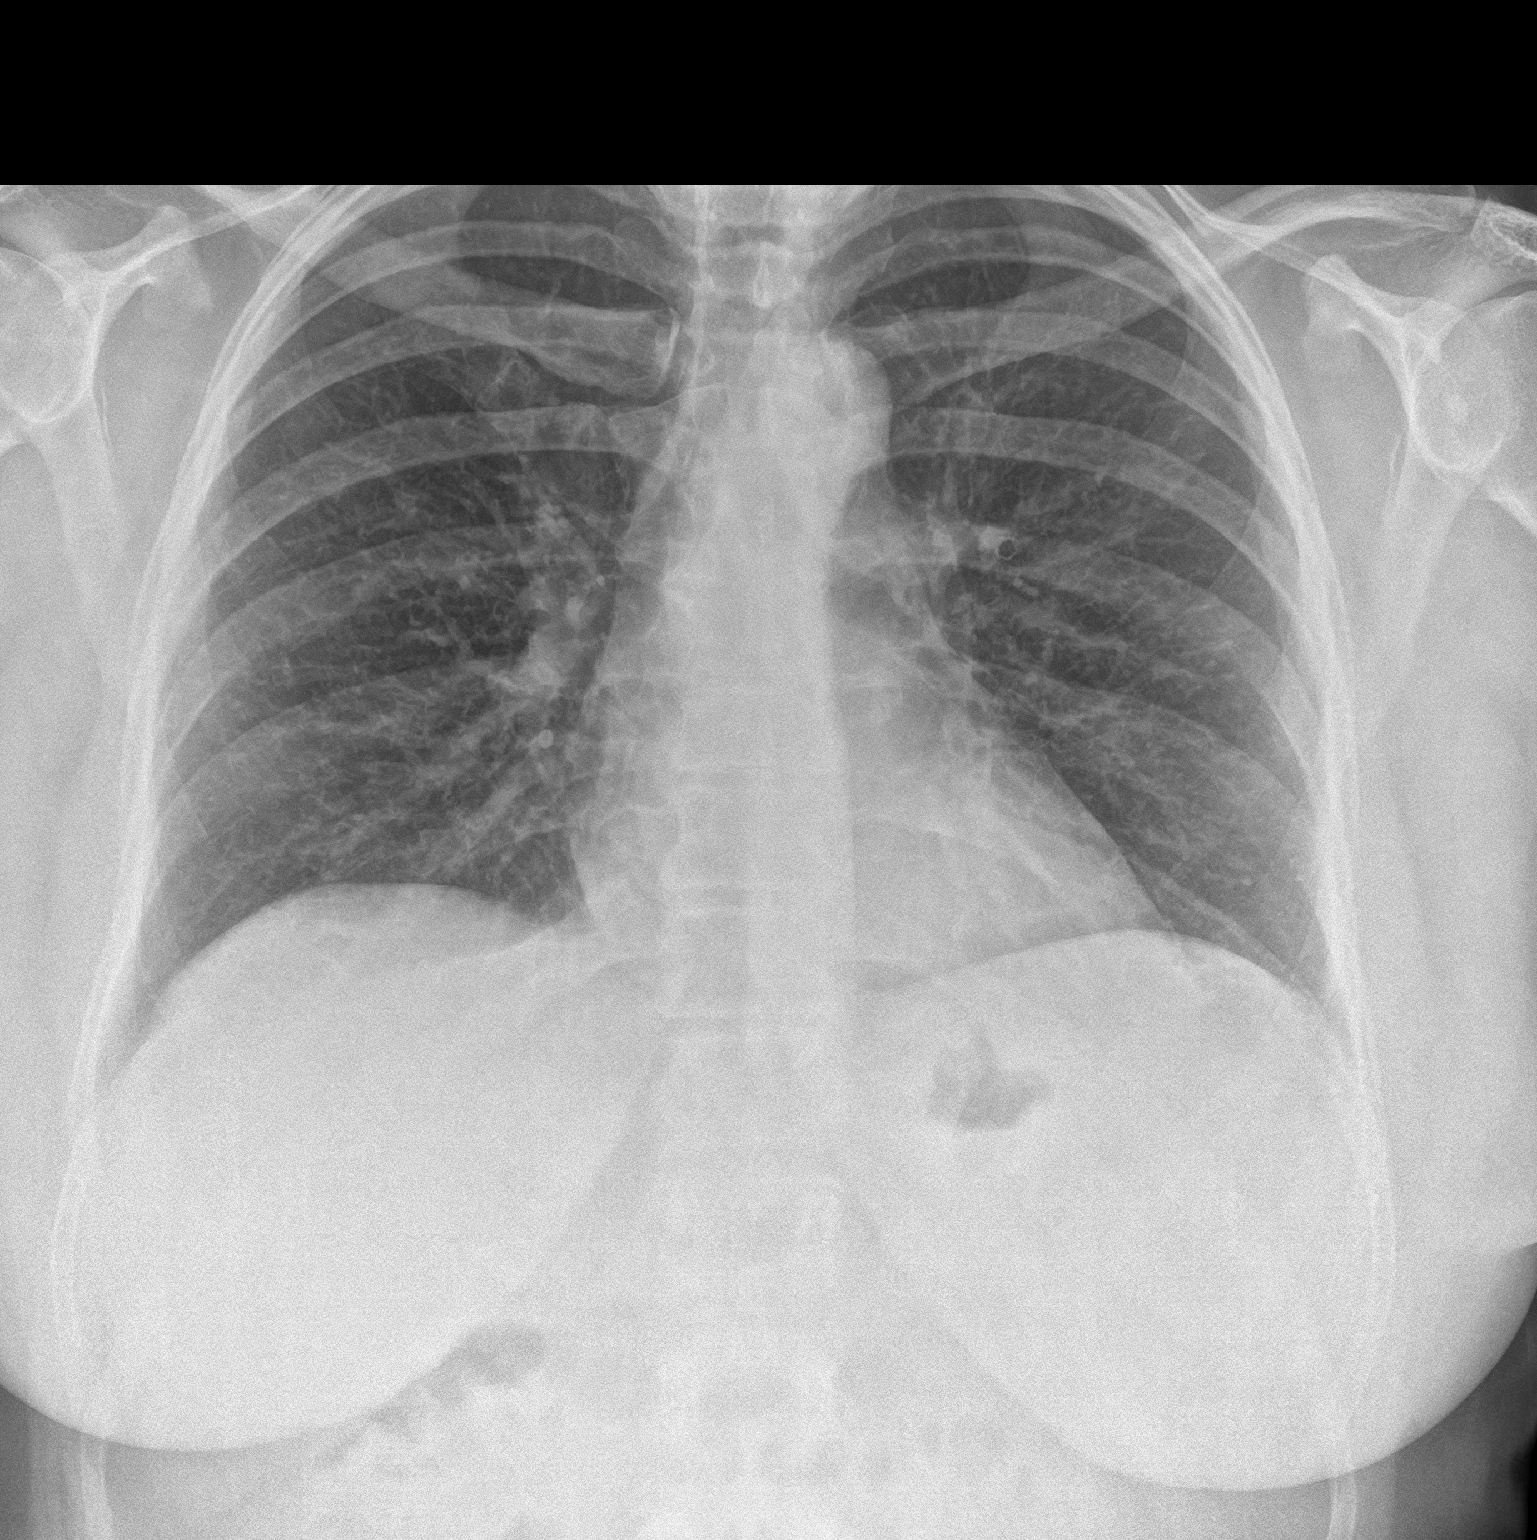
[im 2/2]
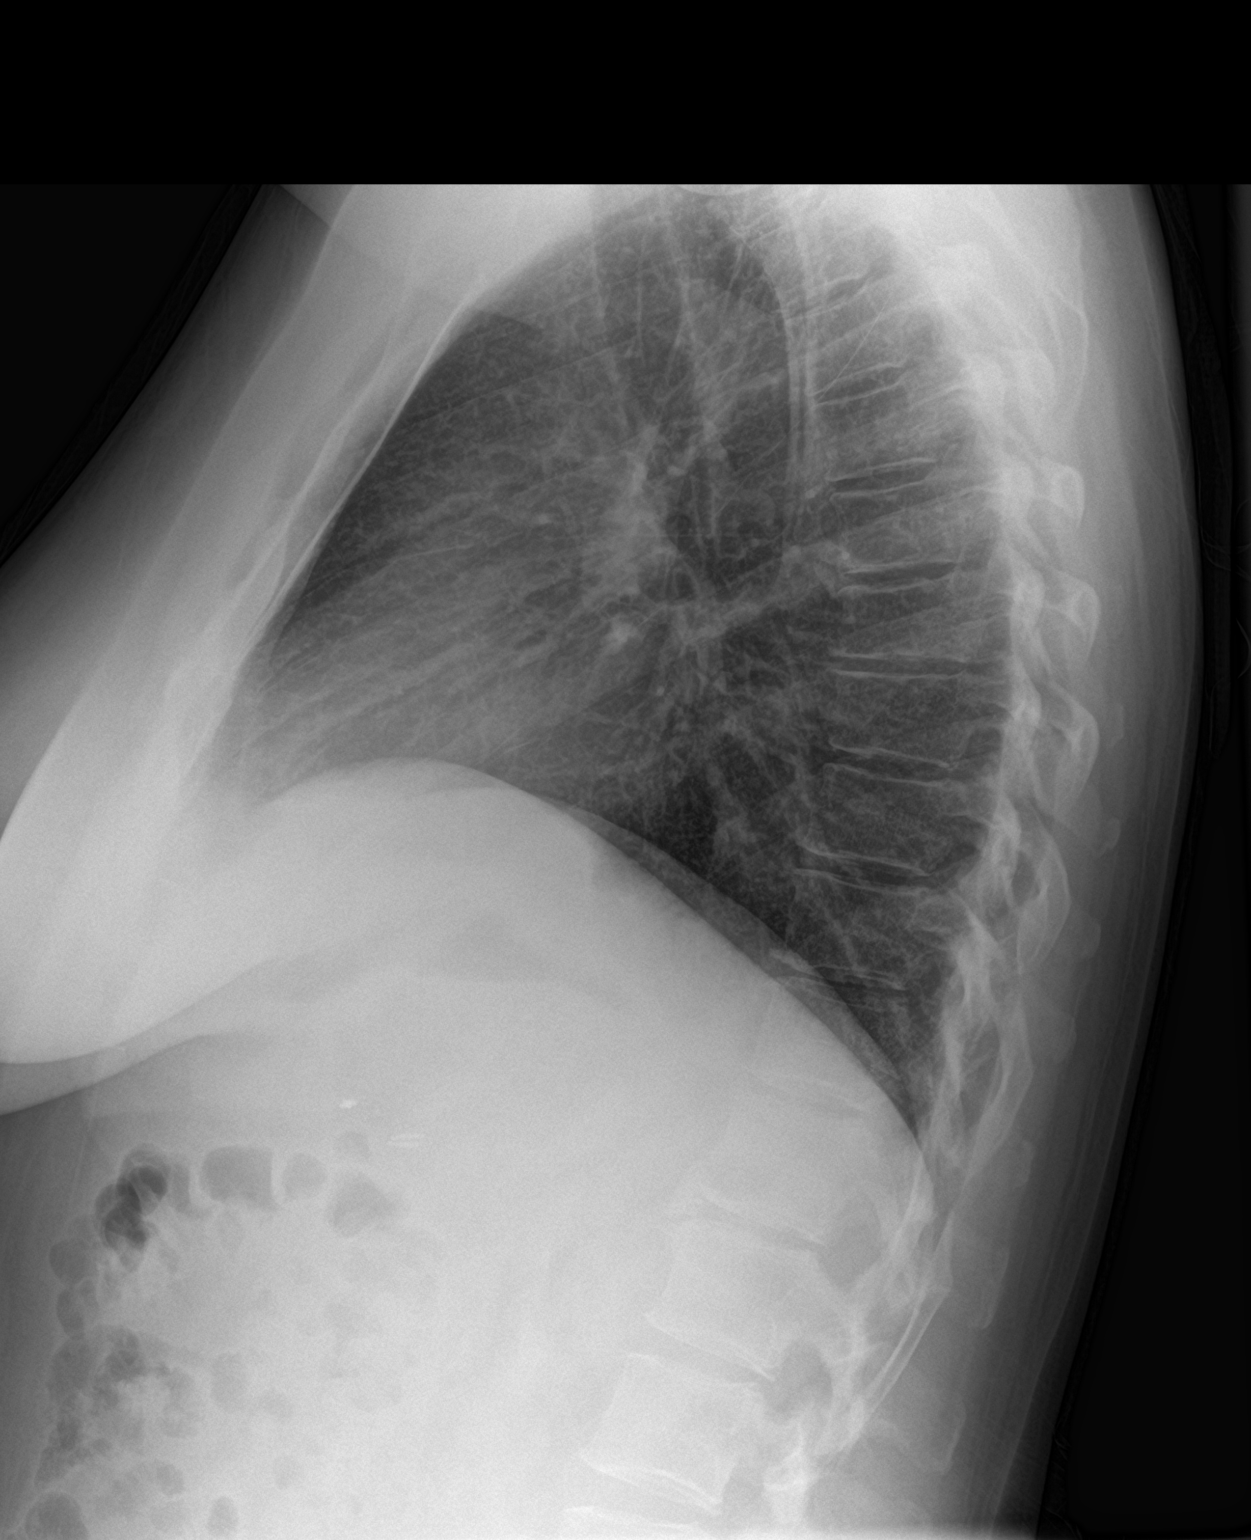

[2 of 2 positions shown; findings below may reference images not displayed]

FINDINGS: No active infiltrate or pleural effusion is seen. Mediastinal and
hilar contours are unremarkable. The heart is within normal limits
in size. No bony abnormality is seen.
IMPRESSION: No active cardiopulmonary disease.

## 2018-04-12 MED ORDER — GI COCKTAIL ~~LOC~~
30.0000 mL | Freq: Once | ORAL | Status: AC
  Administered 2018-04-12: 30 mL via ORAL
  Filled 2018-04-12: qty 30

## 2018-04-12 NOTE — ED Notes (Signed)
Pt discharged home after verbalizing understanding of discharge instructions; nad noted. 

## 2018-04-12 NOTE — ED Triage Notes (Signed)
Says awakened about 3 am with pain all over chest.  Says it was severe then, and has improved some since.  Had gall bladder out last week.

## 2018-04-12 NOTE — Discharge Instructions (Signed)
You have been seen in the emergency department today for chest pain. Your workup has shown normal results. As we discussed please follow-up with your primary care physician in the next 1-2 days for recheck. Return to the emergency department for any further chest pain, trouble breathing, or any other symptom personally concerning to yourself.  Please follow-up with cardiology but call the number provided to arrange a follow-up appointment to discuss further work-up and possible stress test.

## 2018-04-12 NOTE — ED Provider Notes (Signed)
[facility] Emergency Department Provider Note  Time seen: 2:23 PM  I have reviewed the triage vital signs and the nursing notes.   HISTORY  Chief Complaint Chest Pain    HPI Yolanda Young is a 52 y.o. female with a past medical history of gastric reflux, anxiety, presents to the emergency department with chest discomfort.  According to the patient around 330 this morning she was awoken with chest pain going across her entire upper chest.  States it was fairly severe 10/10 in severity.  States after little while it diminished but remains minimally present even now.  States shortness of breath at that time but denies any nausea or diaphoresis.  Patient is one-week status post cholecystectomy but denies eating any large meals fatty or greasy foods before going to bed.  Patient denies any prior chest pains.  Does state continued mild abdominal pain in the upper abdomen since the surgery.  Patient does have a history of gastric reflux/heartburn but states this did not feel like that.  Currently says she is not having any pain but still is aware of a discomfort in her chest.   Past Medical History:  Diagnosis Date  . Colon polyp 02/04/2018   tubular adenoma  . GERD (gastroesophageal reflux disease)   . History of hiatal hernia   . Migraine    weekly  . Panic attacks     Patient Active Problem List   Diagnosis Date Noted  . Personal history of colonic polyps   . Colon cancer screening   . Polyp of sigmoid colon   . Benign neoplasm of descending colon   . Pain of upper abdomen   . Gastritis without bleeding     Past Surgical History:  Procedure Laterality Date  . ABLATION ON ENDOMETRIOSIS  2013   [place]  . CHOLECYSTECTOMY N/A 04/05/2018   Procedure: LAPAROSCOPIC CHOLECYSTECTOMY WITH INTRAOPERATIVE CHOLANGIOGRAM;  Surgeon: Robert Bellow, MD;  Location: [facility] ORS;  Service: General;  Laterality: N/A;  . COLONOSCOPY WITH PROPOFOL N/A 02/04/2018    Procedure: COLONOSCOPY WITH PROPOFOL;  Surgeon: Lucilla Lame, MD;  Location: [employer];  Service: Endoscopy;  Laterality: N/A;  . ESOPHAGOGASTRODUODENOSCOPY (EGD) WITH PROPOFOL N/A 02/04/2018   Procedure: ESOPHAGOGASTRODUODENOSCOPY (EGD) WITH PROPOFOL;  Surgeon: Lucilla Lame, MD;  Location: [place];  Service: Endoscopy;  Laterality: N/A;  . SHOULDER SURGERY Right     Prior to Admission medications   Medication Sig Start Date End Date Taking? Authorizing Provider  HYDROcodone-acetaminophen (NORCO/VICODIN) 5-325 MG tablet Take 1 tablet by mouth every 4 (four) hours as needed for moderate pain. 04/05/18 04/05/19  Robert Bellow, MD  ibuprofen (ADVIL,MOTRIN) 200 MG tablet Take 600 mg by mouth every 6 (six) hours as needed for moderate pain.    [provider]  pantoprazole (PROTONIX) 40 MG tablet Take 1 tablet (40 mg total) by mouth daily. Patient taking differently: Take 40 mg by mouth every morning.  02/04/18 02/04/19  Lucilla Lame, MD  topiramate (TOPAMAX) 50 MG tablet Take 1 tablet (50 mg total) by mouth daily. Patient not taking: Reported on 03/29/2018 09/18/17   Juline Patch, MD  traMADol (ULTRAM) 50 MG tablet Take 1 tablet (50 mg total) by mouth every 6 (six) hours as needed. 09/18/17   Juline Patch, MD    Allergies  Allergen Reactions  . Erythromycin Rash  . Penicillins Rash    Has patient had a PCN reaction causing immediate rash, facial/tongue/throat swelling, SOB or lightheadedness  with hypotension: No Has patient had a PCN reaction causing severe rash involving mucus membranes or skin necrosis: Yes Has patient had a PCN reaction that required hospitalization: No Has patient had a PCN reaction occurring within the last 10 years: No If all of the above answers are "NO", then may proceed with Cephalosporin use.     Family History  Problem Relation Age of Onset  . Diabetes Mother   . Stroke Father   . Crohn's disease Father   . Cancer Maternal  Grandmother   . Cancer Maternal Grandfather   . Diabetes Paternal Grandmother     Social History Social History   Tobacco Use  . Smoking status: Former Smoker    Packs/day: 0.50    Years: 2.00    Pack years: 1.00    Types: Cigarettes    Last attempt to quit: 1999    Years since quitting: 20.3  . Smokeless tobacco: Never Used  Substance Use Topics  . Alcohol use: Never    Alcohol/week: 2.4 oz    Types: 4 Glasses of wine per week    Frequency: Never  . Drug use: No    Review of Systems Constitutional: Negative for fever. Eyes: Negative for visual complaints ENT: Negative for recent illness/congestion Cardiovascular: Positive for chest pain at 330 this morning, largely resolved at this time although minimally present discomfort currently. Respiratory: Negative for shortness of breath. Gastrointestinal: Negative for abdominal pain.  Positive for nausea which is now resolved.  Negative for vomiting. Genitourinary: Negative for urinary compaints Musculoskeletal: Negative for leg pain or swelling Skin: Negative for skin complaints  Neurological: Negative for headache All other ROS negative  ____________________________________________   PHYSICAL EXAM:  VITAL SIGNS: ED Triage Vitals  Enc Vitals Group     BP 04/12/18 1033 134/77     Pulse Rate 04/12/18 1033 72     Resp 04/12/18 1033 16     Temp 04/12/18 1033 97.6 F (36.4 C)     Temp Source 04/12/18 1033 Oral     SpO2 04/12/18 1033 100 %     Weight 04/12/18 1034 215 lb (97.5 kg)     Height 04/12/18 1034 5\' 8"  (1.727 m)     Head Circumference --      Peak Flow --      Pain Score 04/12/18 1033 5     Pain Loc --      Pain Edu? --      Excl. in [place]? --    Constitutional: Alert and oriented. Well appearing and in no distress. Eyes: Normal exam ENT   Head: Normocephalic and atraumatic.   Mouth/Throat: Mucous membranes are moist. Cardiovascular: Normal rate, regular rhythm. No murmur Respiratory: Normal  respiratory effort without tachypnea nor retractions. Breath sounds are clear Gastrointestinal: Soft and nontender. No distention.  Musculoskeletal: Nontender with normal range of motion in all extremities. No lower extremity tenderness or edema. Neurologic:  Normal speech and language. No gross focal neurologic deficits  Skin:  Skin is warm, dry and intact.  Psychiatric: Mood and affect are normal.  ____________________________________________    EKG  EKG reviewed and interpreted by myself shows normal sinus rhythm at 76 bpm with a narrow QRS, left axis deviation, largely normal intervals, no concerning ST changes.  ____________________________________________    RADIOLOGY  Chest x-ray negative  ____________________________________________   INITIAL IMPRESSION / ASSESSMENT AND PLAN / ED COURSE  Pertinent labs & imaging results that were available during my care of the patient were reviewed  by me and considered in my medical decision making (see chart for details).  Patient presents to the emergency department for chest discomfort beginning at 330 overnight awaking the patient from her sleep.  States it was severe at that time but is now largely gone.  Differential is quite broad would include ACS, pneumonia, pneumothorax, esophageal spasm, esophagitis, reflux, gastritis, pain related to recent cholecystectomy, pancreatitis.  Patient's basic labs are largely within normal limits besides a very slight leukocytosis.  Patient x-rays negative.  EKG is reassuring.  Initial cardiac enzymes negative.  We will repeat a troponin.  I have added on a lipase and hepatic function panel to the patient's blood work we will continue to closely monitor.  We will dose a GI cocktail monitor for symptom relief.  Patient agreeable to this plan of care.  Care signed out to Dr. Archie Balboa.  ____________________________________________   FINAL CLINICAL IMPRESSION(S) / ED DIAGNOSES  Chest pain     Harvest Dark, MD 04/12/18 1517

## 2018-04-12 NOTE — ED Provider Notes (Signed)
2nd troponin, LFTs and lipase all without concerning findings. Patient still having slight discomfort, primarily around the right shoulder. At this time think likely referred pain from diaphragm. However discussed possibility of blood clot and risks with patient. She did feel comfortable deferring CT scan at this time, which I believe is reasonable given lack of hypoxia, tachycardia, worsening pain with inspiration, lack of cough and swelling. Did discuss strict return precautions.    Nance Pear, MD 04/12/18 [phone]

## 2018-04-14 ENCOUNTER — Telehealth: Payer: Self-pay | Admitting: *Deleted

## 2018-04-14 NOTE — Telephone Encounter (Signed)
Return to work after 04-20-18 f/u appt -RTW on 5-12-12-17

## 2018-04-14 NOTE — Telephone Encounter (Signed)
I need to know anticipated return to work date?  Thanks

## 2018-04-19 NOTE — H&P (Signed)
Yolanda Young [id number] [birth year]     HPI: 52 year old woman with symptomatic cholelithiasis.  For elective cholecystectomy.  No medications prior to admission.   Allergies  Allergen Reactions  . Erythromycin Rash  . Penicillins Rash    Has patient had a PCN reaction causing immediate rash, facial/tongue/throat swelling, SOB or lightheadedness with hypotension: No Has patient had a PCN reaction causing severe rash involving mucus membranes or skin necrosis: Yes Has patient had a PCN reaction that required hospitalization: No Has patient had a PCN reaction occurring within the last 10 years: No If all of the above answers are "NO", then may proceed with Cephalosporin use.    Past Medical History:  Diagnosis Date  . Colon polyp 02/04/2018   tubular adenoma  . GERD (gastroesophageal reflux disease)   . History of hiatal hernia   . Migraine    weekly  . Panic attacks    Past Surgical History:  Procedure Laterality Date  . ABLATION ON ENDOMETRIOSIS  2013   [place]  . CHOLECYSTECTOMY N/A 04/05/2018   Procedure: LAPAROSCOPIC CHOLECYSTECTOMY WITH INTRAOPERATIVE CHOLANGIOGRAM;  Surgeon: Robert Bellow, MD;  Location: [facility];  Service: General;  Laterality: N/A;  . COLONOSCOPY WITH PROPOFOL N/A 02/04/2018   Procedure: COLONOSCOPY WITH PROPOFOL;  Surgeon: Lucilla Lame, MD;  Location: [place];  Service: Endoscopy;  Laterality: N/A;  . ESOPHAGOGASTRODUODENOSCOPY (EGD) WITH PROPOFOL N/A 02/04/2018   Procedure: ESOPHAGOGASTRODUODENOSCOPY (EGD) WITH PROPOFOL;  Surgeon: Lucilla Lame, MD;  Location: [place];  Service: Endoscopy;  Laterality: N/A;  . SHOULDER SURGERY Right    Social History   Socioeconomic History  . Marital status: Divorced    Spouse name: Not on file  . Number of children: Not on file  . Years of education: Not on file  . Highest education level: Not on file  Occupational History  . Not on file  Social Needs  . Financial resource  strain: Not on file  . Food insecurity:    Worry: Not on file    Inability: Not on file  . Transportation needs:    Medical: Not on file    Non-medical: Not on file  Tobacco Use  . Smoking status: Former Smoker    Packs/day: 0.50    Years: 2.00    Pack years: 1.00    Types: Cigarettes    Last attempt to quit: 1999    Years since quitting: 20.3  . Smokeless tobacco: Never Used  Substance and Sexual Activity  . Alcohol use: Never    Alcohol/week: 2.4 oz    Types: 4 Glasses of wine per week    Frequency: Never  . Drug use: No  . Sexual activity: Yes  Lifestyle  . Physical activity:    Days per week: Not on file    Minutes per session: Not on file  . Stress: Not on file  Relationships  . Social connections:    Talks on phone: Not on file    Gets together: Not on file    Attends religious service: Not on file    Active member of club or organization: Not on file    Attends meetings of clubs or organizations: Not on file    Relationship status: Not on file  . Intimate partner violence:    Fear of current or ex partner: Not on file    Emotionally abused: Not on file    Physically abused: Not on file    Forced sexual activity: Not on file  Other Topics Concern  . Not on file  Social History Narrative  . Not on file   Social History   Social History Narrative  . Not on file     ROS: Negative.     PE: HEENT: Negative. Lungs: Clear. Cardio: RR.   Assessment/Plan:  Proceed with planned cholecystectomy. [name] 04/19/2018

## 2018-04-20 ENCOUNTER — Encounter: Payer: Self-pay | Admitting: General Surgery

## 2018-04-20 ENCOUNTER — Ambulatory Visit (INDEPENDENT_AMBULATORY_CARE_PROVIDER_SITE_OTHER): Payer: BLUE CROSS/BLUE SHIELD | Admitting: General Surgery

## 2018-04-20 VITALS — BP 162/80 | HR 74 | Resp 12 | Ht 67.0 in | Wt 224.0 lb

## 2018-04-20 DIAGNOSIS — R1010 Upper abdominal pain, unspecified: Secondary | ICD-10-CM

## 2018-04-20 MED ORDER — CHOLESTYRAMINE 4 GM/DOSE PO POWD: 4.0000 g | Freq: Two times a day (BID) | ORAL | 1 refills | Status: DC

## 2018-04-20 NOTE — Progress Notes (Signed)
Patient ID: Yolanda Young, female   DOB: 03/27/1966, 52 y.o.   MRN: [id number]  Chief Complaint  Patient presents with  . Routine Post Op    HPI Yolanda Young is a 52 y.o. female here today for her gallbladder done on 04/05/2018. Patient states she is has lot of diarrhea, feels bloated. Moves her bowels three times daily.  Stools are reported watery without urgency.  No clear food trigger, most commonly these are postprandial episodes.    Past Medical History:  Diagnosis Date  . Colon polyp 02/04/2018   tubular adenoma  . GERD (gastroesophageal reflux disease)   . History of hiatal hernia   . Migraine    weekly  . Panic attacks     Past Surgical History:  Procedure Laterality Date  . ABLATION ON ENDOMETRIOSIS  2013   [place]  . CHOLECYSTECTOMY N/A 04/05/2018   Procedure: LAPAROSCOPIC CHOLECYSTECTOMY WITH INTRAOPERATIVE CHOLANGIOGRAM;  Surgeon: Robert Bellow, MD;  Location: [facility];  Service: General;  Laterality: N/A;  . COLONOSCOPY WITH PROPOFOL N/A 02/04/2018   Procedure: COLONOSCOPY WITH PROPOFOL;  Surgeon: Lucilla Lame, MD;  Location: [place];  Service: Endoscopy;  Laterality: N/A;  . ESOPHAGOGASTRODUODENOSCOPY (EGD) WITH PROPOFOL N/A 02/04/2018   Procedure: ESOPHAGOGASTRODUODENOSCOPY (EGD) WITH PROPOFOL;  Surgeon: Lucilla Lame, MD;  Location: [place];  Service: Endoscopy;  Laterality: N/A;  . SHOULDER SURGERY Right     Family History  Problem Relation Age of Onset  . Diabetes Mother   . Stroke Father   . Crohn's disease Father   . Cancer Maternal Grandmother   . Cancer Maternal Grandfather   . Diabetes Paternal Grandmother     Social History Social History   Tobacco Use  . Smoking status: Former Smoker    Packs/day: 0.50    Years: 2.00    Pack years: 1.00    Types: Cigarettes    Last attempt to quit: 1999    Years since quitting: 20.3  . Smokeless tobacco: Never Used  Substance Use Topics  . Alcohol use: Never    Alcohol/week: 2.4  oz    Types: 4 Glasses of wine per week    Frequency: Never  . Drug use: No    Allergies  Allergen Reactions  . Erythromycin Rash  . Penicillins Rash    Has patient had a PCN reaction causing immediate rash, facial/tongue/throat swelling, SOB or lightheadedness with hypotension: No Has patient had a PCN reaction causing severe rash involving mucus membranes or skin necrosis: Yes Has patient had a PCN reaction that required hospitalization: No Has patient had a PCN reaction occurring within the last 10 years: No If all of the above answers are "NO", then may proceed with Cephalosporin use.     Current Outpatient Medications  Medication Sig Dispense Refill  . HYDROcodone-acetaminophen (NORCO/VICODIN) 5-325 MG tablet Take 1 tablet by mouth every 4 (four) hours as needed for moderate pain. 15 tablet 0  . ibuprofen (ADVIL,MOTRIN) 200 MG tablet Take 600 mg by mouth every 6 (six) hours as needed for moderate pain.    . pantoprazole (PROTONIX) 40 MG tablet Take 1 tablet (40 mg total) by mouth daily. (Patient taking differently: Take 40 mg by mouth every morning. ) 30 tablet 5  . topiramate (TOPAMAX) 50 MG tablet Take 1 tablet (50 mg total) by mouth daily. 30 tablet 2  . traMADol (ULTRAM) 50 MG tablet Take 1 tablet (50 mg total) by mouth every 6 (six) hours as needed. 20 tablet 0  .  cholestyramine (QUESTRAN) 4 GM/DOSE powder Take 1 packet (4 g total) by mouth 2 (two) times daily with a meal. 378 g 1   No current facility-administered medications for this visit.     Review of Systems Review of Systems  Blood pressure (!) 162/80, pulse 74, resp. rate 12, height 5\' 7"  (1.702 m), weight 224 lb (101.6 kg).  Physical Exam Physical Exam  Constitutional: She is oriented to person, place, and time. She appears well-developed and well-nourished.  Cardiovascular: Normal rate, regular rhythm and normal heart sounds.  Abdominal: Soft. Normal appearance.  Port sites are clean and healing well.    Neurological: She is alert and oriented to person, place, and time.  Skin: Skin is warm and dry.    Data Reviewed DIAGNOSIS:  A. GALLBLADDER; CHOLECYSTECTOMY:  - CHRONIC CHOLECYSTITIS.  - NEGATIVE FOR MALIGNANCY.    Emergency department records of Apr 12, 2018 reviewed. Laboratory studies of Apr 12, 2018: Hepatic function panel showed an indirect hyperbilirubinemia with mild elevation of the ALT.  Normal alkaline phosphatase.  Normal total bilirubin. Lipase was normal.  White blood cell count 12,100.  Hemoglobin 15.1.  Assessment    Postprandial diarrhea post cholecystectomy.  Episode of chest pain last week without repeat.  ED evaluation for chest pain without cardiac etiology noted.    Plan  The patient is aware to use a heating pad as needed for comfort. Call and report in one week. Patient to return as needed.  Will make use of a trial of Questran 4 g packet twice daily before breakfast and dinner.  Patient asked to call in 1 week with a progress report.  HPI, Physical Exam, Assessment and Plan have been scribed under the direction and in the presence of Hervey Ard, MD.  Gaspar Cola, CMA  I have completed the exam and reviewed the above documentation for accuracy and completeness.  I agree with the above.  Haematologist has been used and any errors in dictation or transcription are unintentional.  Hervey Ard, M.D., F.A.C.S.  Yolanda Young 04/20/2018, 8:32 PM

## 2018-04-20 NOTE — Patient Instructions (Signed)
  The patient is aware to use a heating pad as needed for comfort. Call and report in one week. Patient to return as needed.

## 2018-06-01 IMAGING — CR DG LUMBAR SPINE COMPLETE 4+V
5 series · 5 of 5 positions shown · non-contrast
Comparison: None.

CLINICAL DATA: Pt states she fell 10 days ago outside. She was
picking up garden hose and saw a lot of baby SOFE snakes and
fell backwards onto SOFE on raised garden bed hitting lower
back. Most pain left lower back at the crest level. Hematuria for 3
days.

EXAM:
LUMBAR SPINE - COMPLETE 4+ VIEW

[l-spine ap]
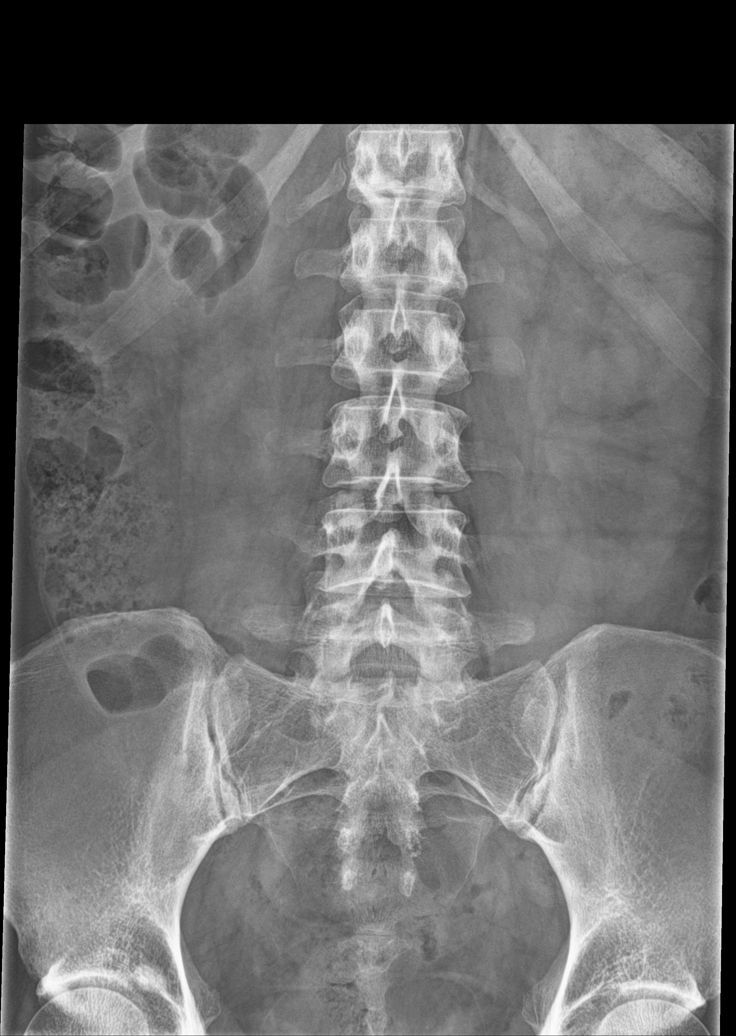

[l-spine obl (1 of 2)]
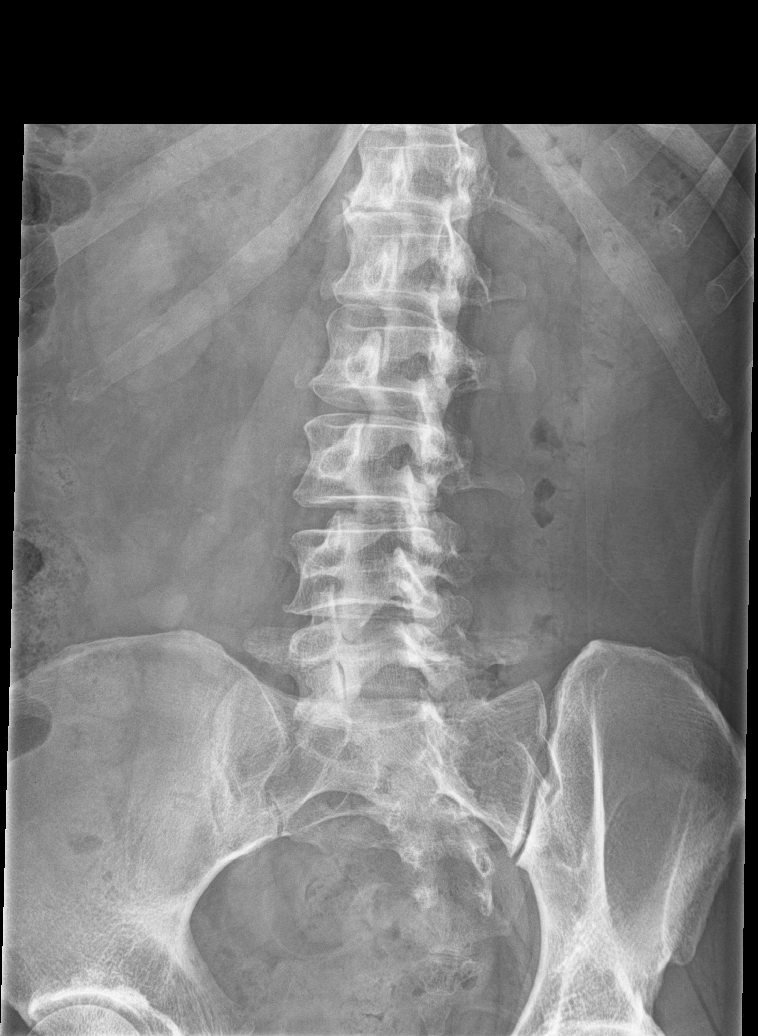

[l-spine obl (2 of 2)]
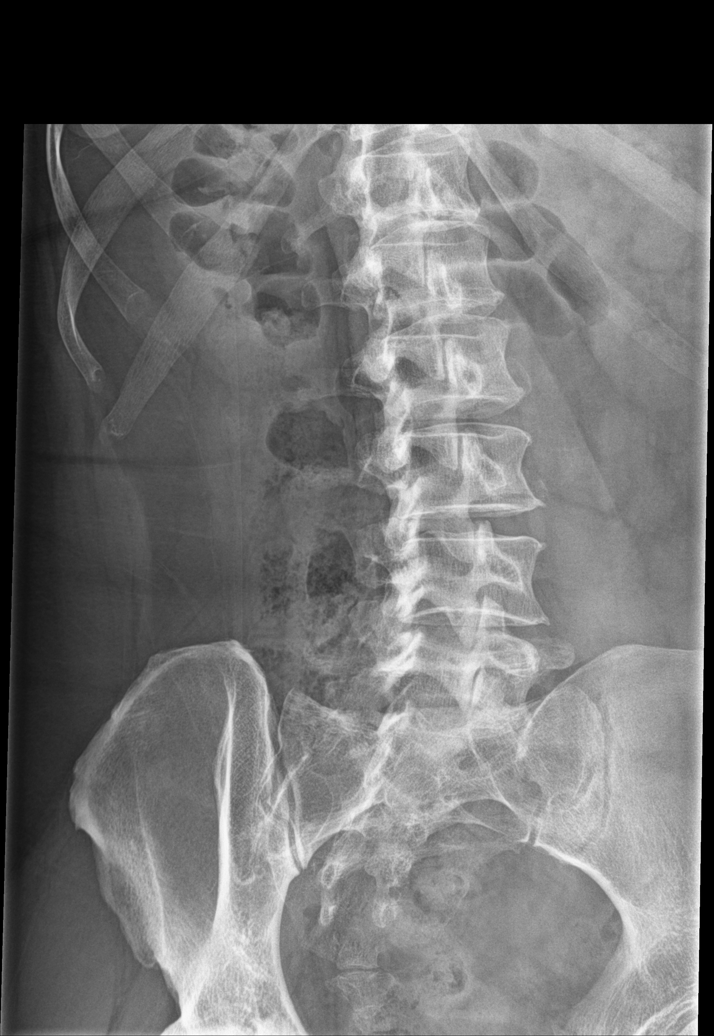

[l-spine lat]
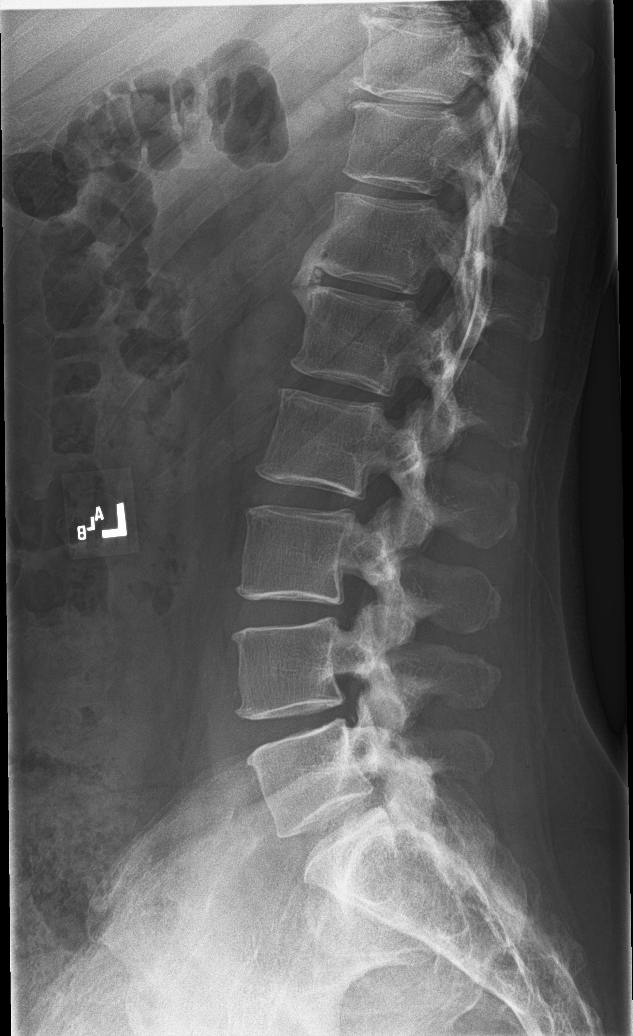

[l-spine spot]
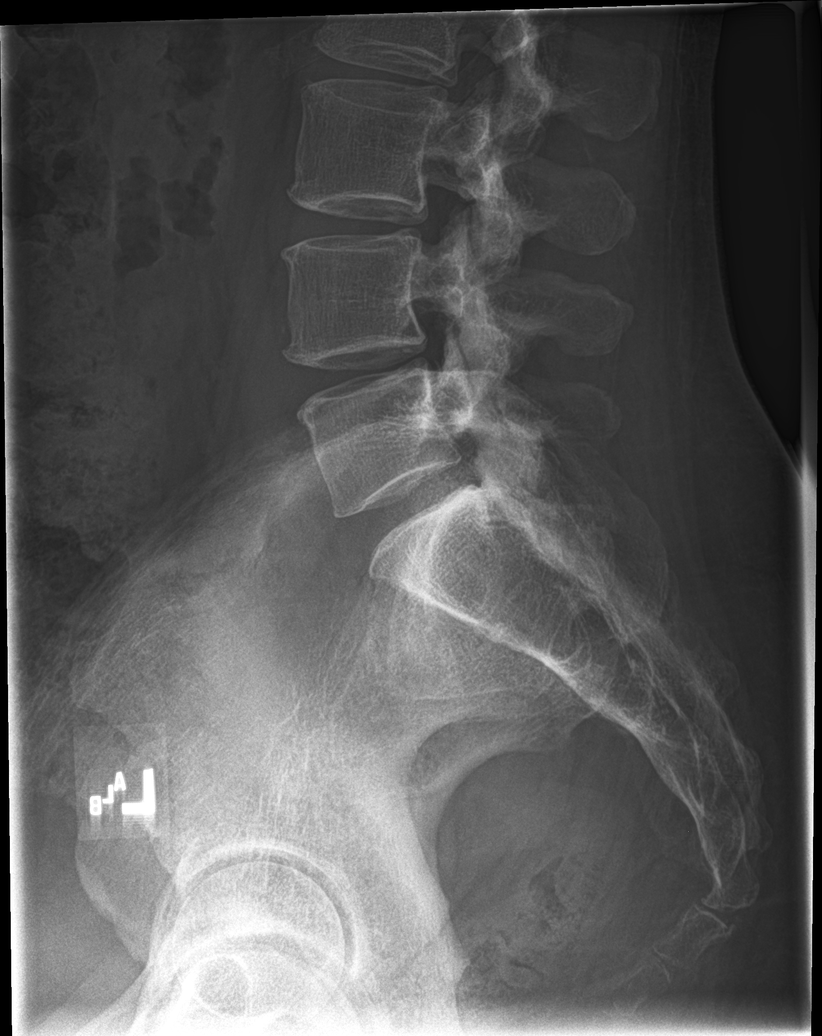

[5 of 5 positions shown; findings below may reference images not displayed]

FINDINGS: There are degenerative changes primarily at T12-L1. No acute
fracture or traumatic subluxation. No lytic or blastic lesions.
IMPRESSION: Negative.

## 2018-10-19 IMAGING — US US ABDOMEN LIMITED
1 series · 14 of 25 positions shown · non-contrast
Comparison: None.

CLINICAL DATA: Right upper quadrant pain for 2 weeks

EXAM:
ULTRASOUND ABDOMEN LIMITED RIGHT UPPER QUADRANT

[Series 1: us abdomen limited · 0.20mm/px · 14 of 48 slices shown]
[im 1/48]
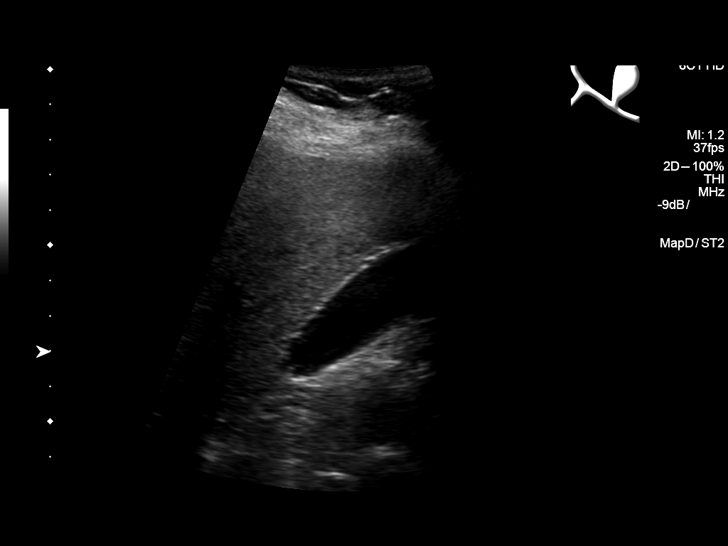
[im 4/48]
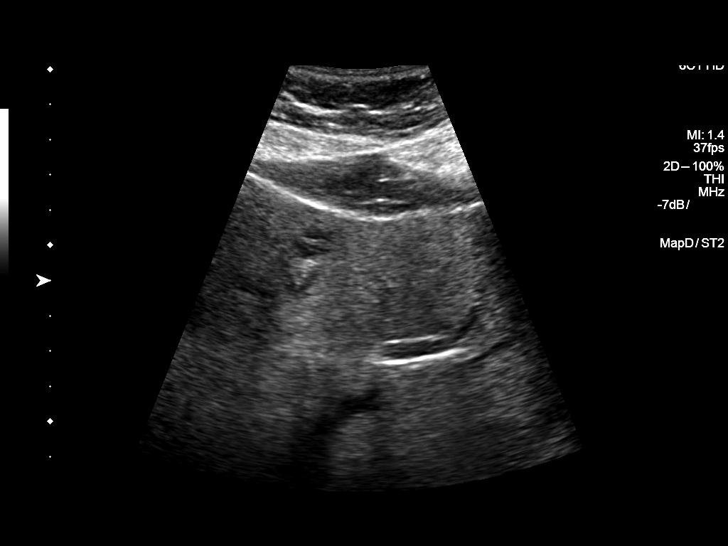
[im 8/48]
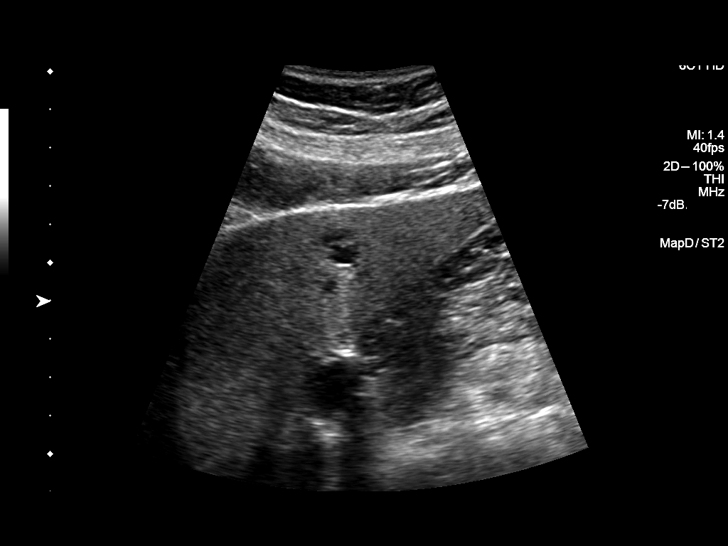
[im 12/48]
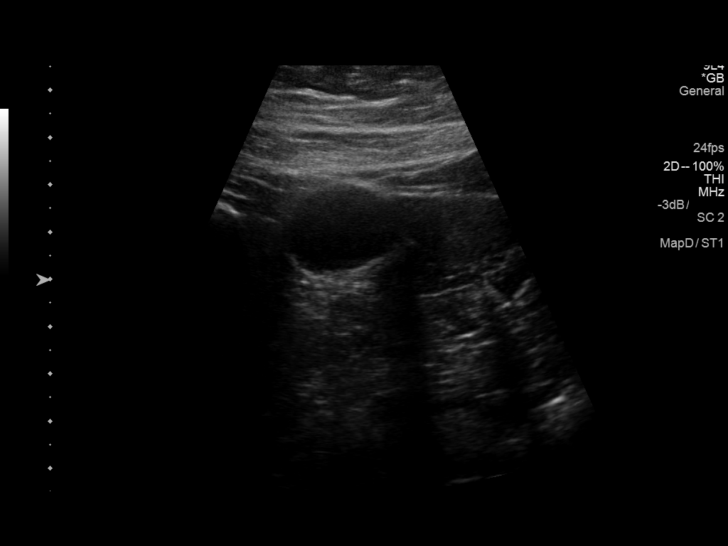
[im 16/48]
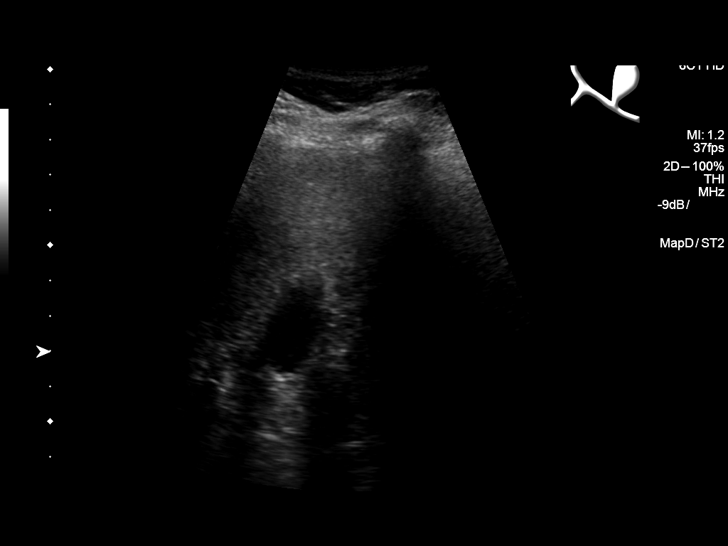
[im 18/48]
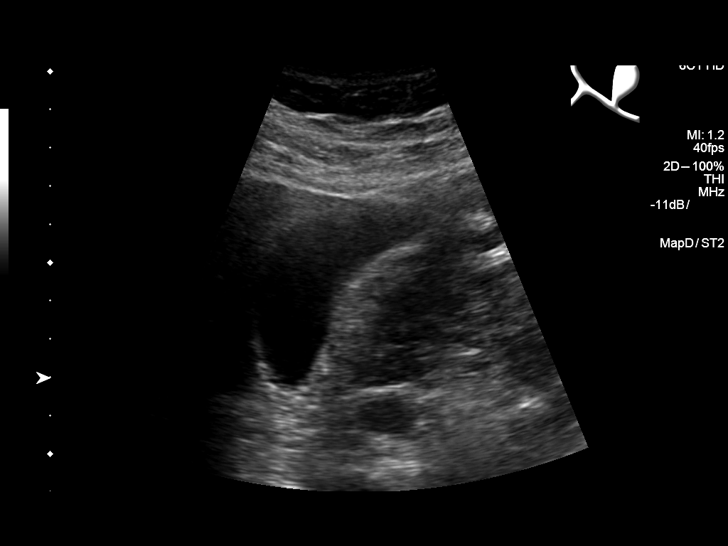
[im 22/48]
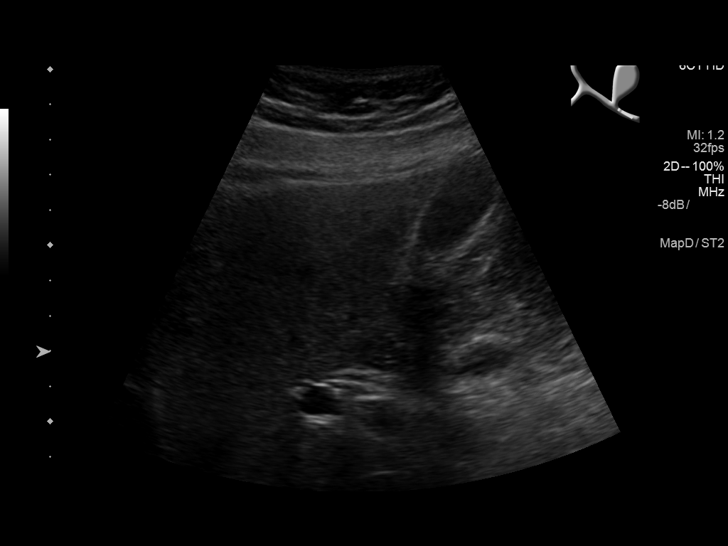
[im 26/48]
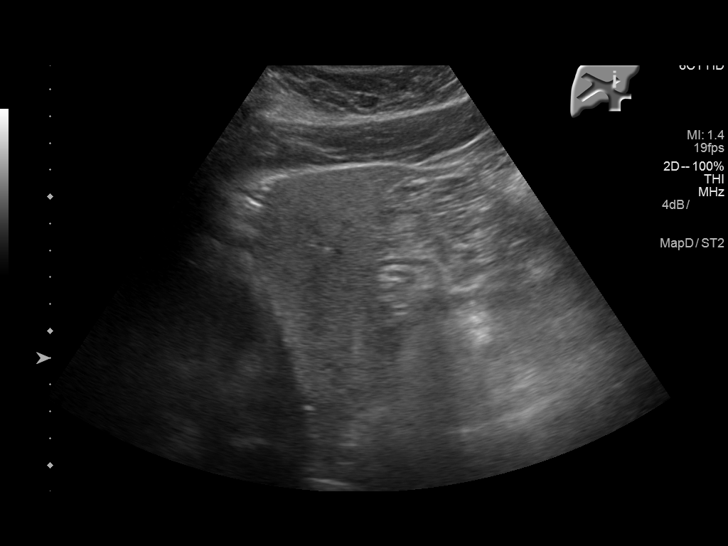
[im 30/48]
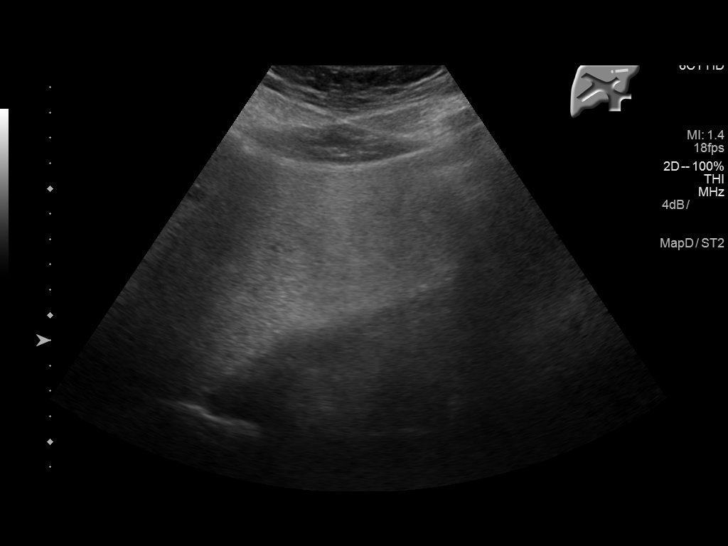
[im 32/48]
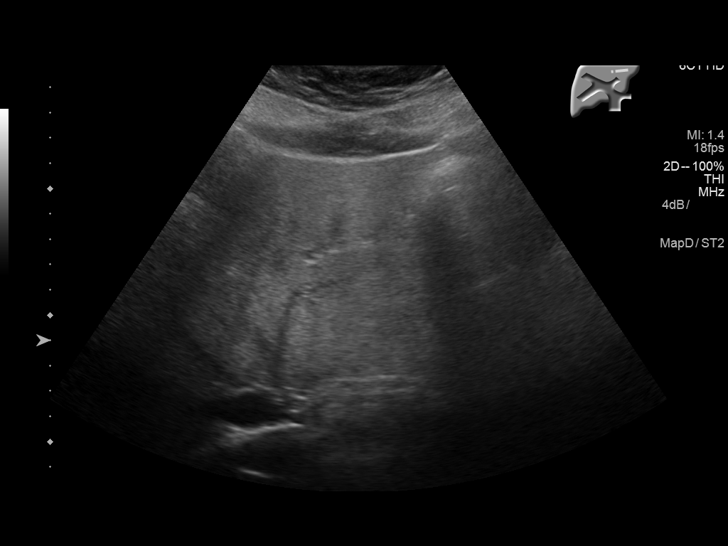
[im 36/48]
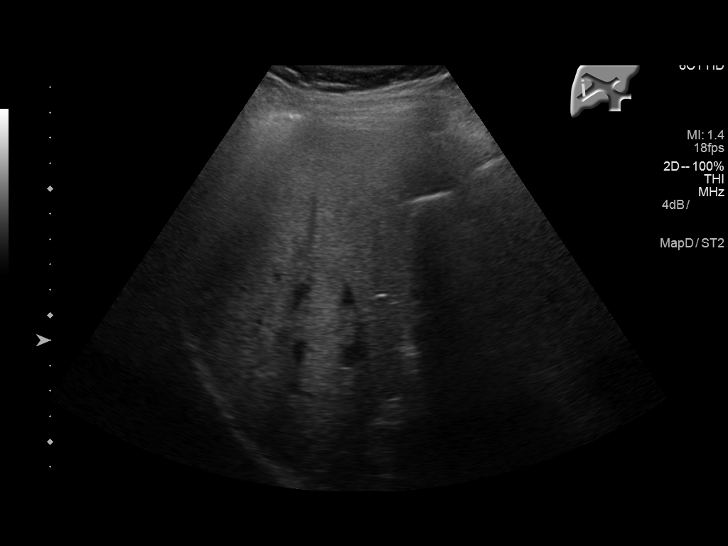
[im 40/48]
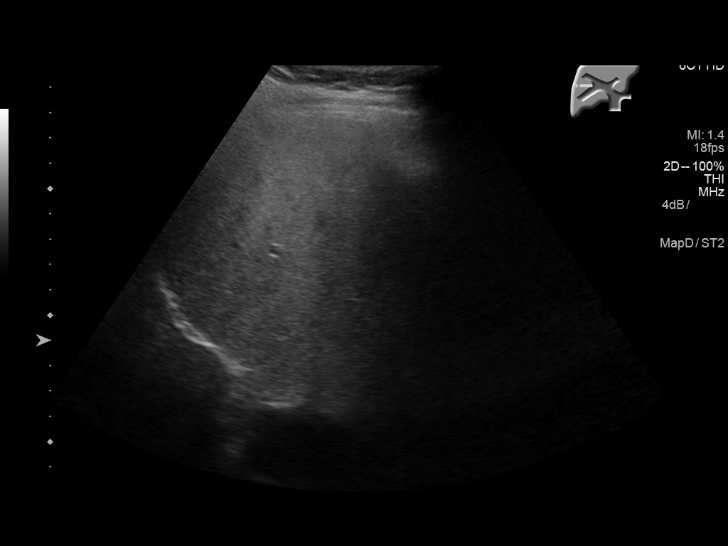
[im 44/48]
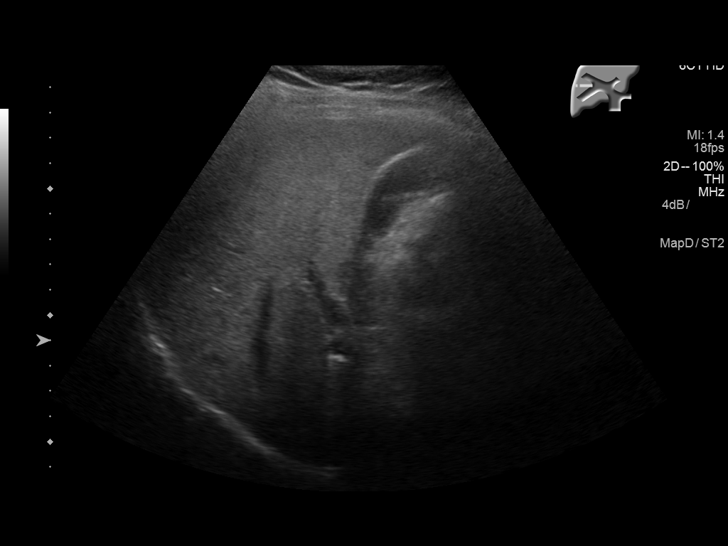
[im 48/48]
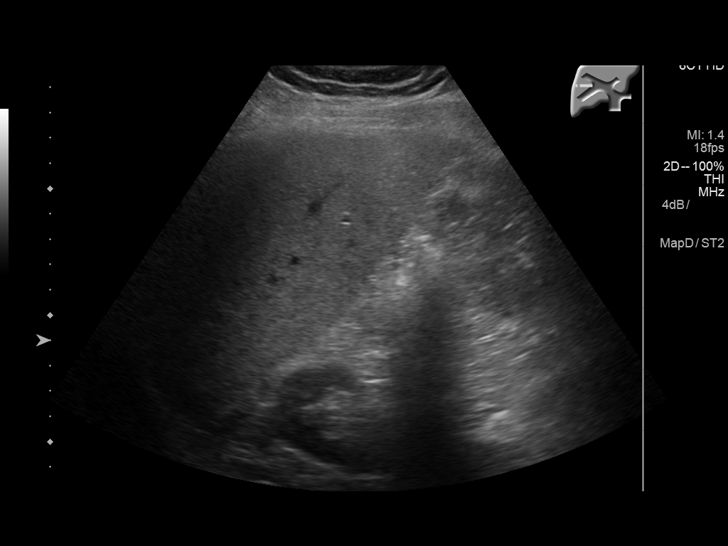

[14 of 25 positions shown; findings below may reference images not displayed]

FINDINGS: Gallbladder:

No gallstones or wall thickening visualized. No sonographic Murphy
sign noted by sonographer.

Common bile duct:

Diameter: 2 mm

Liver:

Small anterior left liver septated minimally complex cyst measures
1.2 cm. This correlates with the previous CT. No other focal hepatic
abnormality. No biliary dilatation.

Portal vein is patent on color Doppler imaging with normal direction
of blood flow towards the liver.
IMPRESSION: Negative for gallstones or acute finding by ultrasound.

1.2 cm anterior left hepatic minimally complex cyst incidentally
noted.

No biliary dilatation or obstruction

No free fluid or ascites

## 2019-02-17 ENCOUNTER — Ambulatory Visit (INDEPENDENT_AMBULATORY_CARE_PROVIDER_SITE_OTHER): Payer: BC Managed Care – PPO | Admitting: Family Medicine

## 2019-02-17 ENCOUNTER — Other Ambulatory Visit: Payer: Self-pay

## 2019-02-17 ENCOUNTER — Encounter: Payer: Self-pay | Admitting: Family Medicine

## 2019-02-17 VITALS — BP 120/70 | HR 80 | Ht 67.0 in | Wt 235.0 lb

## 2019-02-17 DIAGNOSIS — Z9889 Other specified postprocedural states: Secondary | ICD-10-CM

## 2019-02-17 DIAGNOSIS — N939 Abnormal uterine and vaginal bleeding, unspecified: Secondary | ICD-10-CM | POA: Diagnosis not present

## 2019-02-17 DIAGNOSIS — R319 Hematuria, unspecified: Secondary | ICD-10-CM

## 2019-02-17 DIAGNOSIS — N200 Calculus of kidney: Secondary | ICD-10-CM | POA: Diagnosis not present

## 2019-02-17 LAB — POCT URINALYSIS DIPSTICK
BILIRUBIN UA: NEGATIVE
Glucose, UA: NEGATIVE
KETONES UA: NEGATIVE
Leukocytes, UA: NEGATIVE
Nitrite, UA: NEGATIVE
PH UA: 5 (ref 5.0–8.0)
Protein, UA: NEGATIVE
Spec Grav, UA: 1.02 (ref 1.010–1.025)
Urobilinogen, UA: 0.2 E.U./dL

## 2019-02-17 MED ORDER — TAMSULOSIN HCL 0.4 MG PO CAPS
0.4000 mg | ORAL_CAPSULE | Freq: Every day | ORAL | 0 refills | Status: DC
Start: 2019-02-17 — End: 2019-12-29

## 2019-02-17 NOTE — Progress Notes (Signed)
Date:  02/17/2019   Name:  Yolanda Young   DOB:  1966-03-23   MRN:  [id number]   Chief Complaint: Flank Pain (started Sunday night and noticed blood in urine on Monday. No more blood in urine at this ime. No fever or chills since Sunday. )  Flank Pain  This is a new problem. The current episode started in the past 7 days. The problem has been resolved since onset. Pain location: right flank. The quality of the pain is described as stabbing. Radiates to: to suprapubic area. The pain is at a severity of 9/10. The pain is severe. Exacerbated by: no aggravating/alleviation factors. Pertinent negatives include no abdominal pain, bladder incontinence, bowel incontinence, chest pain, dysuria, fever, headaches, leg pain, numbness, paresis, paresthesias, pelvic pain, tingling, weakness or weight loss. She has tried nothing for the symptoms.    Review of Systems  Constitutional: Negative.  Negative for chills, fatigue, fever, unexpected weight change and weight loss.  HENT: Negative for congestion, ear discharge, ear pain, rhinorrhea, sinus pressure, sneezing and sore throat.   Eyes: Negative for photophobia, pain, discharge, redness and itching.  Respiratory: Negative for cough, shortness of breath, wheezing and stridor.   Cardiovascular: Negative for chest pain.  Gastrointestinal: Negative for abdominal pain, blood in stool, bowel incontinence, constipation, diarrhea, nausea and vomiting.  Endocrine: Negative for cold intolerance, heat intolerance, polydipsia, polyphagia and polyuria.  Genitourinary: Positive for flank pain. Negative for bladder incontinence, dysuria, frequency, hematuria, menstrual problem, pelvic pain, urgency, vaginal bleeding and vaginal discharge.  Musculoskeletal: Negative for arthralgias, back pain and myalgias.  Skin: Negative for rash.  Allergic/Immunologic: Negative for environmental allergies and food allergies.  Neurological: Negative for dizziness, tingling, weakness,  light-headedness, numbness, headaches and paresthesias.  Hematological: Negative for adenopathy. Does not bruise/bleed easily.  Psychiatric/Behavioral: Negative for dysphoric mood. The patient is not nervous/anxious.     Patient Active Problem List   Diagnosis Date Noted  . Personal history of colonic polyps   . Colon cancer screening   . Polyp of sigmoid colon   . Benign neoplasm of descending colon   . Pain of upper abdomen   . Gastritis without bleeding     Allergies  Allergen Reactions  . Erythromycin Rash  . Penicillins Rash    Has patient had a PCN reaction causing immediate rash, facial/tongue/throat swelling, SOB or lightheadedness with hypotension: No Has patient had a PCN reaction causing severe rash involving mucus membranes or skin necrosis: Yes Has patient had a PCN reaction that required hospitalization: No Has patient had a PCN reaction occurring within the last 10 years: No If all of the above answers are "NO", then may proceed with Cephalosporin use.     Past Surgical History:  Procedure Laterality Date  . ABLATION ON ENDOMETRIOSIS  2013   [place]  . CHOLECYSTECTOMY N/A 04/05/2018   Procedure: LAPAROSCOPIC CHOLECYSTECTOMY WITH INTRAOPERATIVE CHOLANGIOGRAM;  Surgeon: Robert Bellow, MD;  Location: [facility];  Service: General;  Laterality: N/A;  . COLONOSCOPY WITH PROPOFOL N/A 02/04/2018   Procedure: COLONOSCOPY WITH PROPOFOL;  Surgeon: Lucilla Lame, MD;  Location: [place];  Service: Endoscopy;  Laterality: N/A;  . ESOPHAGOGASTRODUODENOSCOPY (EGD) WITH PROPOFOL N/A 02/04/2018   Procedure: ESOPHAGOGASTRODUODENOSCOPY (EGD) WITH PROPOFOL;  Surgeon: Lucilla Lame, MD;  Location: [place];  Service: Endoscopy;  Laterality: N/A;  . SHOULDER SURGERY Right     Social History   Tobacco Use  . Smoking status: Former Smoker    Packs/day: 0.50  Years: 2.00    Pack years: 1.00    Types: Cigarettes    Last attempt to quit: 1999    Years  since quitting: 21.2  . Smokeless tobacco: Never Used  Substance Use Topics  . Alcohol use: Never    Alcohol/week: 4.0 standard drinks    Types: 4 Glasses of wine per week    Frequency: Never  . Drug use: No     Medication list has been reviewed and updated.  No outpatient medications have been marked as taking for the 02/17/19 encounter (Office Visit) with Juline Patch, MD.    [facility] 2/9 Scores 09/18/2017 06/20/2016  PHQ - 2 Score 0 0  PHQ- 9 Score 0 -    Physical Exam Vitals signs and nursing note reviewed.  Constitutional:      General: She is not in acute distress.    Appearance: Normal appearance. She is not diaphoretic.  HENT:     Head: Normocephalic and atraumatic.     Right Ear: Tympanic membrane and external ear normal.     Left Ear: Tympanic membrane and external ear normal.     Nose: Nose normal.  Eyes:     General:        Right eye: No discharge.        Left eye: No discharge.     Conjunctiva/sclera: Conjunctivae normal.     Pupils: Pupils are equal, round, and reactive to light.  Neck:     Musculoskeletal: Normal range of motion and neck supple.     Thyroid: No thyromegaly.     Vascular: No JVD.  Cardiovascular:     Rate and Rhythm: Normal rate and regular rhythm.     Heart sounds: Normal heart sounds. No murmur. No friction rub. No gallop.   Pulmonary:     Effort: Pulmonary effort is normal.     Breath sounds: Normal breath sounds.  Abdominal:     General: Bowel sounds are normal.     Palpations: Abdomen is soft. There is no mass.     Tenderness: There is abdominal tenderness in the suprapubic area. There is no right CVA tenderness, left CVA tenderness or guarding.  Musculoskeletal: Normal range of motion.  Lymphadenopathy:     Cervical: No cervical adenopathy.  Skin:    General: Skin is warm and dry.  Neurological:     Mental Status: She is alert.     Deep Tendon Reflexes: Reflexes are normal and symmetric.     Wt Readings from Last 3  Encounters:  02/17/19 235 lb (106.6 kg)  04/20/18 224 lb (101.6 kg)  04/12/18 215 lb (97.5 kg)    BP 120/70   Pulse 80   Ht 5\' 7"  (1.702 m)   Wt 235 lb (106.6 kg)   BMI 36.81 kg/m   Assessment and Plan: 1. Hematuria, unspecified type New onset patient is certain that this is from a urinary source.  Patient had this associated with significant flank pain.  Review of previous CT of pelvic and abdominal area notes no nephrolithiasis approximately 2 years ago.  However symptoms are very suggestive of a kidney stone patient will be referred to urology for further evaluation dipstick still notes nonhemolyzed trace hemoglobin. - POCT urinalysis dipstick - Ambulatory referral to Urology  2. Nephrolithiasis As noted above symptoms and signs were suggestive of a nephrolithiasis will get have a 2-week course of tamsulosin to relax smooth muscle for continued passage of any further stones at this time.  Patient  was urged to urology for further evaluation and if necessary treatment. - Ambulatory referral to Urology - tamsulosin (FLOMAX) 0.4 MG CAPS capsule; Take 1 capsule (0.4 mg total) by mouth daily.  Dispense: 14 capsule; Refill: 0  3. Uterine bleeding Patient had a history of abnormal uterine bleeding which required an ablation she is not having any uterine bleeding at this time however she does not need routine GYN maintenance evaluation and treatment.  4. History of endometrial ablation Patient will be referred to gynecology for evaluation and treatment with history of endometrial appellation - Ambulatory referral to Gynecology

## 2019-02-18 ENCOUNTER — Telehealth: Payer: Self-pay | Admitting: Obstetrics and Gynecology

## 2019-02-18 NOTE — Telephone Encounter (Signed)
[facility] referring for hx Uterine bleeding with ablation- needs follow up ( [facility]). Called and left voicemail for patient to call back to be schedule

## 2019-02-23 NOTE — Telephone Encounter (Signed)
Called and left voice mail for patient to call back to be schedule °

## 2019-03-04 NOTE — Telephone Encounter (Signed)
Called and left voicemail for patient to call back to be schedule. 3rd attempt to reach patient will contact PCP about unable to reach patient to schedule

## 2019-03-25 ENCOUNTER — Ambulatory Visit: Payer: Self-pay | Admitting: Urology

## 2019-06-24 ENCOUNTER — Ambulatory Visit: Payer: Self-pay | Admitting: Urology

## 2019-09-20 ENCOUNTER — Telehealth: Payer: BLUE CROSS/BLUE SHIELD | Admitting: Physician Assistant

## 2019-09-20 ENCOUNTER — Other Ambulatory Visit: Payer: Self-pay

## 2019-09-20 DIAGNOSIS — Z20822 Contact with and (suspected) exposure to covid-19: Secondary | ICD-10-CM

## 2019-09-20 DIAGNOSIS — Z20828 Contact with and (suspected) exposure to other viral communicable diseases: Secondary | ICD-10-CM

## 2019-09-20 MED ORDER — BENZONATATE 100 MG PO CAPS: 100.0000 mg | ORAL_CAPSULE | Freq: Three times a day (TID) | ORAL | 0 refills | Status: DC | PRN

## 2019-09-20 NOTE — Progress Notes (Signed)
E-Visit for Corona Virus Screening   Your current symptoms could be consistent with the coronavirus.  Many health care providers can now test patients at their office but not all are.  [facility] has multiple testing sites. For information on our COVID testing locations and hours go to [email or web address]  Please quarantine yourself while awaiting your test results.  We are enrolling you in our [place] for [facility] . Daily you will receive a questionnaire within the [facility] website. Our COVID 19 response team willl be monitoriing your responses daily.    COVID-19 is a respiratory illness with symptoms that are similar to the flu. Symptoms are typically mild to moderate, but there have been cases of severe illness and death due to the virus. The following symptoms may appear 2-14 days after exposure: . Fever . Cough . Shortness of breath or difficulty breathing . Chills . Repeated shaking with chills . Muscle pain . Headache . Sore throat . New loss of taste or smell . Fatigue . Congestion or runny nose . Nausea or vomiting . Diarrhea  It is vitally important that if you feel that you have an infection such as this virus or any other virus that you stay home and away from places where you may spread it to others.  You should self-quarantine for 14 days if you have symptoms that could potentially be coronavirus or have been in close contact a with a person diagnosed with COVID-19 within the last 2 weeks. You should avoid contact with people age 69 and older.   You should wear a mask or cloth face covering over your nose and mouth if you must be around other people or animals, including pets (even at home). Try to stay at least 6 feet away from other people. This will protect the people around you.  You can use medication such as A prescription cough medication called Tessalon Perles 100 mg. You may take 1-2 capsules every 8 hours as needed for  cough  You may also take acetaminophen (Tylenol) as needed for fever.   Reduce your risk of any infection by using the same precautions used for avoiding the common cold or flu:  Marland Kitchen Wash your hands often with soap and warm water for at least 20 seconds.  If soap and water are not readily available, use an alcohol-based hand sanitizer with at least 60% alcohol.  . If coughing or sneezing, cover your mouth and nose by coughing or sneezing into the elbow areas of your shirt or coat, into a tissue or into your sleeve (not your hands). . Avoid shaking hands with others and consider head nods or verbal greetings only. . Avoid touching your eyes, nose, or mouth with unwashed hands.  . Avoid close contact with people who are sick. . Avoid places or events with large numbers of people in one location, like concerts or sporting events. . Carefully consider travel plans you have or are making. . If you are planning any travel outside or inside the [place], visit the CDC's Travelers' Health webpage for the latest health notices. . If you have some symptoms but not all symptoms, continue to monitor at home and seek medical attention if your symptoms worsen. . If you are having a medical emergency, call 911.  HOME CARE . Only take medications as instructed by your medical team. . Drink plenty of fluids and get plenty of rest. . A steam or ultrasonic humidifier can help if you have congestion.  GET HELP RIGHT AWAY IF YOU HAVE EMERGENCY WARNING SIGNS** FOR COVID-19. If you or someone is showing any of these signs seek emergency medical care immediately. Call 911 or proceed to your closest emergency facility if: . You develop worsening high fever. . Trouble breathing . Bluish lips or face . Persistent pain or pressure in the chest . New confusion . Inability to wake or stay awake . You cough up blood. . Your symptoms become more severe  **This list is not all possible symptoms. Contact your medical  provider for any symptoms that are sever or concerning to you.   MAKE SURE YOU   Understand these instructions.  Will watch your condition.  Will get help right away if you are not doing well or get worse.  Your e-visit answers were reviewed by a board certified advanced clinical practitioner to complete your personal care plan.  Depending on the condition, your plan could have included both over the counter or prescription medications.  If there is a problem please reply once you have received a response from your provider.  Your safety is important to [place].  If you have drug allergies check your prescription carefully.    You can use MyChart to ask questions about today's visit, request a non-urgent call back, or ask for a work or school excuse for 24 hours related to this e-Visit. If it has been greater than 24 hours you will need to follow up with your provider, or enter a new e-Visit to address those concerns. You will get an e-mail in the next two days asking about your experience.  I hope that your e-visit has been valuable and will speed your recovery. Thank you for using e-visits.   Greater than 5 minutes, yet less than 10 minutes of time have been spent researching, coordinating, and implementing care for this patient today

## 2019-09-22 LAB — NOVEL CORONAVIRUS, NAA: SARS-CoV-2, NAA: NOT DETECTED

## 2019-11-07 ENCOUNTER — Ambulatory Visit (LOCAL_COMMUNITY_HEALTH_CENTER): Payer: Medicaid Other

## 2019-11-07 ENCOUNTER — Other Ambulatory Visit: Payer: Self-pay

## 2019-11-07 DIAGNOSIS — Z111 Encounter for screening for respiratory tuberculosis: Secondary | ICD-10-CM

## 2019-11-10 ENCOUNTER — Other Ambulatory Visit: Payer: Self-pay

## 2019-11-10 ENCOUNTER — Ambulatory Visit (LOCAL_COMMUNITY_HEALTH_CENTER): Payer: Medicaid Other

## 2019-11-10 DIAGNOSIS — Z111 Encounter for screening for respiratory tuberculosis: Secondary | ICD-10-CM

## 2019-11-10 LAB — TB SKIN TEST
Induration: 0 mm
TB Skin Test: NEGATIVE

## 2019-12-29 ENCOUNTER — Ambulatory Visit
Admission: RE | Admit: 2019-12-29 | Discharge: 2019-12-29 | Disposition: A | Discharge status: Home / Self Care | Payer: 59 | Attending: Internal Medicine | Admitting: Internal Medicine

## 2019-12-29 ENCOUNTER — Ambulatory Visit: Payer: 59 | Admitting: Family Medicine

## 2019-12-29 ENCOUNTER — Ambulatory Visit
Admission: RE | Admit: 2019-12-29 | Discharge: 2019-12-29 | Disposition: A | Discharge status: Home / Self Care | Payer: 59 | Source: Ambulatory Visit | Attending: Family Medicine | Admitting: Family Medicine

## 2019-12-29 ENCOUNTER — Other Ambulatory Visit: Payer: Self-pay

## 2019-12-29 ENCOUNTER — Encounter: Payer: Self-pay | Admitting: Family Medicine

## 2019-12-29 VITALS — BP 130/82 | HR 110 | Ht 67.0 in | Wt 241.0 lb

## 2019-12-29 DIAGNOSIS — S93412A Sprain of calcaneofibular ligament of left ankle, initial encounter: Secondary | ICD-10-CM

## 2019-12-29 IMAGING — CR DG ANKLE COMPLETE 3+V*L*
3 series · 3 of 3 positions shown · non-contrast
Comparison: None.

CLINICAL DATA: Injury

EXAM:
LEFT ANKLE COMPLETE - 3+ VIEW

[ankle ap]
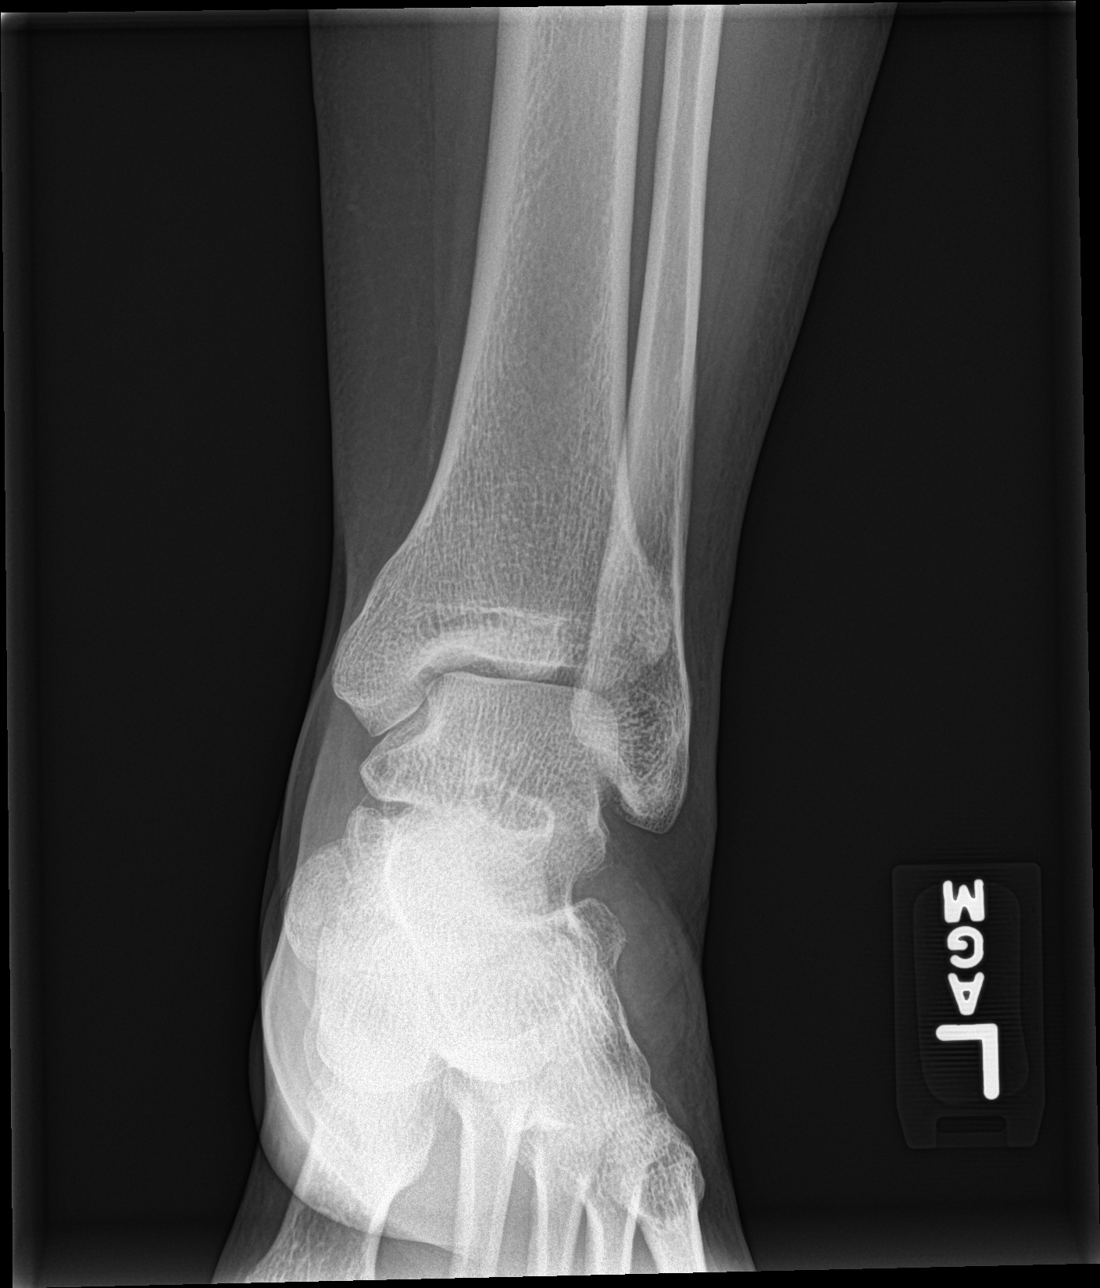

[ankle obl]
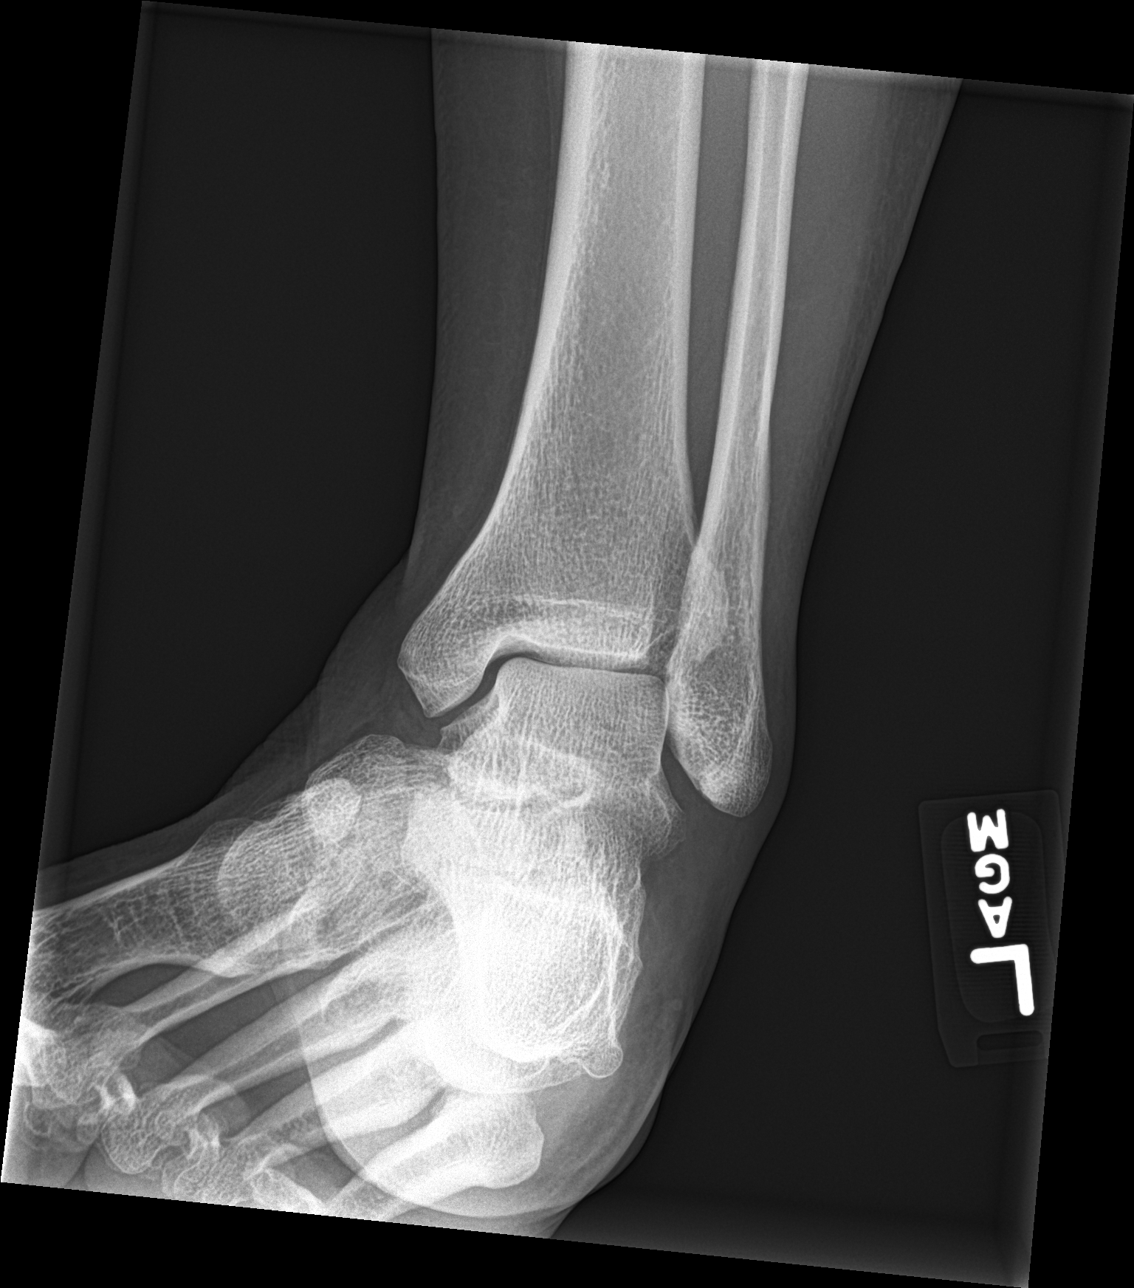

[ankle lat]
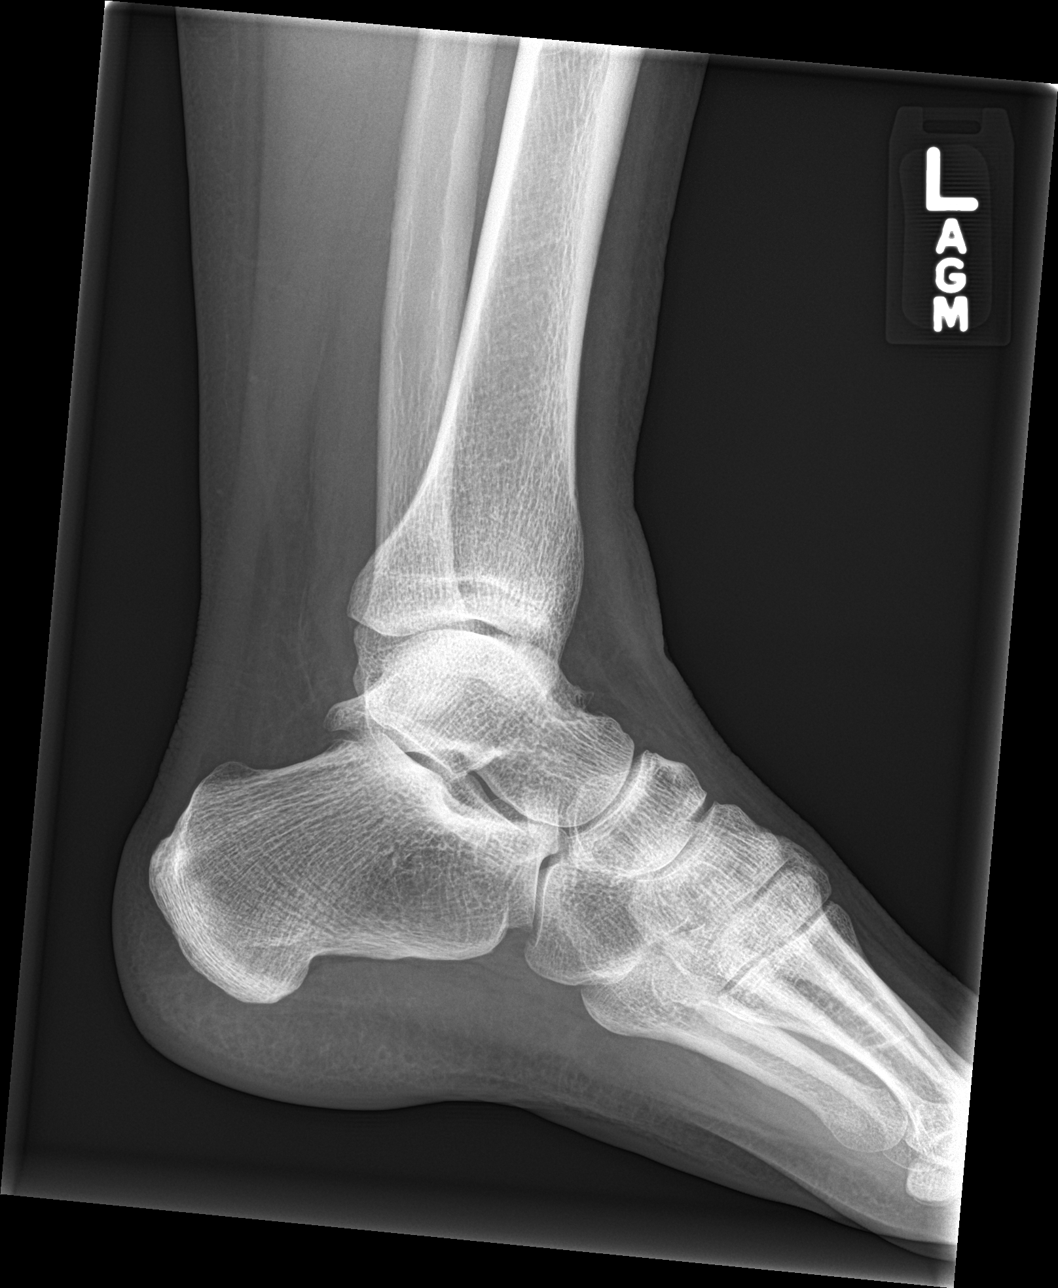

[3 of 3 positions shown; findings below may reference images not displayed]

FINDINGS: There is no evidence of fracture, dislocation, or joint effusion.
There is no evidence of arthropathy or other focal bone abnormality.
Soft tissues are unremarkable.
IMPRESSION: Negative.

## 2019-12-29 MED ORDER — MELOXICAM 15 MG PO TABS: 15.0000 mg | ORAL_TABLET | Freq: Every day | ORAL | 0 refills | Status: DC

## 2019-12-29 NOTE — Patient Instructions (Signed)
Ankle Sprain  An ankle sprain is a stretch or tear in a ligament in the ankle. Ligaments are tissues that connect bones to each other. The two most common types of ankle sprains are:  Inversion sprain. This happens when the foot turns inward and the ankle rolls outward. It affects the ligament on the outside of the foot (lateral ligament).  Eversion sprain. This happens when the foot turns outward and the ankle rolls inward. It affects the ligament on the inner side of the foot (medial ligament). What are the causes? This condition is often caused by accidentally rolling or twisting the ankle. What increases the risk? You are more likely to develop this condition if you play sports. What are the signs or symptoms? Symptoms of this condition include:  Pain in your ankle.  Swelling.  Bruising. This may develop right after you sprain your ankle or 1-2 days later.  Trouble standing or walking, especially when you turn or change directions. How is this diagnosed? This condition is diagnosed with:  A physical exam. During the exam, your health care provider will press on certain parts of your foot and ankle and try to move them in certain ways.  X-ray imaging. These may be taken to see how severe the sprain is and to check for broken bones. How is this treated? This condition may be treated with:  A brace or splint. This is used to keep the ankle from moving until it heals.  An elastic bandage. This is used to support the ankle.  Crutches.  Pain medicine.  Surgery. This may be needed if the sprain is severe.  Physical therapy. This may help to improve the range of motion in the ankle. Follow these instructions at home: If you have a brace or a splint:  Wear the brace or splint as told by your health care provider. Remove it only as told by your health care provider.  Loosen the brace or splint if your toes tingle, become numb, or turn cold and blue.  Keep the brace or  splint clean.  If the brace or splint is not waterproof: ? Do not let it get wet. ? Cover it with a watertight covering when you take a bath or a shower. If you have an elastic bandage (dressing):  Remove it to shower or bathe.  Try not to move your ankle much, but wiggle your toes from time to time. This helps to prevent swelling.  Adjust the dressing to make it more comfortable if it feels too tight.  Loosen the dressing if you have numbness or tingling in your foot, or if your foot becomes cold and blue. Managing pain, stiffness, and swelling   Take over-the-counter and prescription medicines only as told by your health care provider.  For 2-3 days, keep your ankle raised (elevated) above the level of your heart as much as possible.  If directed, put ice on the injured area: ? If you have a removable brace or splint, remove it as told by your health care provider. ? Put ice in a plastic bag. ? Place a towel between your skin and the bag. ? Leave the ice on for 20 minutes, 2-3 times a day. General instructions  Rest your ankle.  Do not use the injured limb to support your body weight until your health care provider says that you can. Use crutches as told by your health care provider.  Do not use any products that contain nicotine or tobacco, such as   cigarettes, e-cigarettes, and chewing tobacco. If you need help quitting, ask your health care provider.  Keep all follow-up visits as told by your health care provider. This is important. Contact a health care provider if:  You have rapidly increasing bruising or swelling.  Your pain is not relieved with medicine. Get help right away if:  Your foot or toes become numb or blue.  You have severe pain that gets worse. Summary  An ankle sprain is a stretch or tear in a ligament in the ankle. Ligaments are tissues that connect bones to each other.  This condition is often caused by accidentally rolling or twisting the  ankle.  Symptoms include pain, swelling, bruising, and trouble walking.  To relieve pain and swelling, put ice on the affected ankle, raise your ankle above the level of your heart, and use an elastic bandage.  Keep all follow-up visits as told by your health care provider. This is important. This information is not intended to replace advice given to you by your health care provider. Make sure you discuss any questions you have with your health care provider. Document Revised: 08/16/2018 Document Reviewed: 04/20/2018 Elsevier Patient Education  2020 [employer]  

## 2019-12-29 NOTE — Progress Notes (Signed)
Date:  12/29/2019   Name:  Yolanda Young   DOB:  [birth year]   MRN:  [id number]   Chief Complaint: Ankle Pain (L) ankle x 3 weeks- tripped in driveway, twisting ankle)  Ankle Pain  The incident occurred more than 1 week ago. The incident occurred at home. The injury mechanism was a fall. The pain is present in the left ankle. The quality of the pain is described as aching. The pain is at a severity of 7/10. The pain is moderate. The pain has been constant since onset. Pertinent negatives include no inability to bear weight, loss of motion, loss of sensation, muscle weakness, numbness or tingling. The symptoms are aggravated by movement, palpation and weight bearing. She has tried acetaminophen, non-weight bearing and heat for the symptoms. The treatment provided mild relief.    Lab Results  Component Value Date   CREATININE 0.76 04/12/2018   BUN 13 04/12/2018   NA 136 04/12/2018   K 4.4 04/12/2018   CL 104 04/12/2018   CO2 22 04/12/2018   No results found for: CHOL, HDL, LDLCALC, LDLDIRECT, TRIG, CHOLHDL No results found for: TSH No results found for: HGBA1C   Review of Systems  Constitutional: Negative.  Negative for activity change, appetite change, chills, diaphoresis, fatigue, fever and unexpected weight change.  HENT: Negative for congestion, ear discharge, ear pain, postnasal drip, rhinorrhea, sinus pressure, sinus pain, sneezing and sore throat.   Eyes: Negative for photophobia, pain, discharge, redness and itching.  Respiratory: Negative for cough, shortness of breath, wheezing and stridor.   Cardiovascular: Negative for chest pain, palpitations and leg swelling.  Gastrointestinal: Negative for abdominal pain, blood in stool, constipation, diarrhea, nausea and vomiting.  Endocrine: Negative for cold intolerance, heat intolerance, polydipsia, polyphagia and polyuria.  Genitourinary: Negative for dysuria, flank pain, frequency, hematuria, menstrual problem, pelvic pain,  urgency, vaginal bleeding and vaginal discharge.  Musculoskeletal: Negative for arthralgias, back pain and myalgias.  Skin: Negative for rash.  Allergic/Immunologic: Negative for environmental allergies and food allergies.  Neurological: Negative for dizziness, tingling, weakness, light-headedness, numbness and headaches.  Hematological: Negative for adenopathy. Does not bruise/bleed easily.  Psychiatric/Behavioral: Negative for dysphoric mood. The patient is not nervous/anxious.     Patient Active Problem List   Diagnosis Date Noted  . Personal history of colonic polyps   . Colon cancer screening   . Polyp of sigmoid colon   . Benign neoplasm of descending colon   . Pain of upper abdomen   . Gastritis without bleeding     Allergies  Allergen Reactions  . Erythromycin Rash  . Penicillins Rash    Has patient had a PCN reaction causing immediate rash, facial/tongue/throat swelling, SOB or lightheadedness with hypotension: No Has patient had a PCN reaction causing severe rash involving mucus membranes or skin necrosis: Yes Has patient had a PCN reaction that required hospitalization: No Has patient had a PCN reaction occurring within the last 10 years: No If all of the above answers are "NO", then may proceed with Cephalosporin use.     Past Surgical History:  Procedure Laterality Date  . ABLATION ON ENDOMETRIOSIS  2013   [place]  . CHOLECYSTECTOMY N/A 04/05/2018   Procedure: LAPAROSCOPIC CHOLECYSTECTOMY WITH INTRAOPERATIVE CHOLANGIOGRAM;  Surgeon: Robert Bellow, MD;  Location: [facility];  Service: General;  Laterality: N/A;  . COLONOSCOPY WITH PROPOFOL N/A 02/04/2018   Procedure: COLONOSCOPY WITH PROPOFOL;  Surgeon: Lucilla Lame, MD;  Location: [place];  Service: Endoscopy;  Laterality: N/A;  . ESOPHAGOGASTRODUODENOSCOPY (EGD) WITH PROPOFOL N/A 02/04/2018   Procedure: ESOPHAGOGASTRODUODENOSCOPY (EGD) WITH PROPOFOL;  Surgeon: Lucilla Lame, MD;  Location: [place];  Service: Endoscopy;  Laterality: N/A;  . SHOULDER SURGERY Right     Social History   Tobacco Use  . Smoking status: Former Smoker    Packs/day: 0.50    Years: 2.00    Pack years: 1.00    Types: Cigarettes    Quit date: 1999    Years since quitting: 22.0  . Smokeless tobacco: Never Used  Substance Use Topics  . Alcohol use: Never    Alcohol/week: 4.0 standard drinks    Types: 4 Glasses of wine per week  . Drug use: No     Medication list has been reviewed and updated.  Current Meds  Medication Sig  . ibuprofen (ADVIL,MOTRIN) 200 MG tablet Take 600 mg by mouth every 6 (six) hours as needed for moderate pain.    PHQ 2/9 Scores 09/18/2017 06/20/2016  PHQ - 2 Score 0 0  PHQ- 9 Score 0 -    BP Readings from Last 3 Encounters:  12/29/19 130/82  02/17/19 120/70  04/20/18 (!) 162/80    Physical Exam Vitals and nursing note reviewed.  Constitutional:      General: She is not in acute distress.    Appearance: She is not diaphoretic.  HENT:     Head: Normocephalic and atraumatic.     Right Ear: Tympanic membrane, ear canal and external ear normal.     Left Ear: Tympanic membrane, ear canal and external ear normal.     Nose: Nose normal. No congestion or rhinorrhea.     Mouth/Throat:     Mouth: Mucous membranes are moist.  Eyes:     General:        Right eye: No discharge.        Left eye: No discharge.     Conjunctiva/sclera: Conjunctivae normal.     Pupils: Pupils are equal, round, and reactive to light.  Neck:     Thyroid: No thyromegaly.     Vascular: No JVD.  Cardiovascular:     Rate and Rhythm: Normal rate and regular rhythm.     Heart sounds: Normal heart sounds. No murmur. No friction rub. No gallop.   Pulmonary:     Effort: Pulmonary effort is normal.     Breath sounds: Normal breath sounds. No wheezing, rhonchi or rales.  Abdominal:     General: Bowel sounds are normal.     Palpations: Abdomen is soft. There is no mass.     Tenderness:  There is no abdominal tenderness. There is no guarding.  Musculoskeletal:        General: No swelling.     Cervical back: Normal range of motion and neck supple.     Left ankle: Swelling present. No deformity or ecchymosis. Tenderness present over the medial malleolus, CF ligament and proximal fibula. Decreased range of motion. Normal pulse.     Left Achilles Tendon: Normal.     Comments: Negative instability  Lymphadenopathy:     Cervical: No cervical adenopathy.  Skin:    General: Skin is warm and dry.     Capillary Refill: Capillary refill takes less than 2 seconds.  Neurological:     Mental Status: She is alert.     Deep Tendon Reflexes: Reflexes are normal and symmetric.     Wt Readings from Last 3 Encounters:  12/29/19 241 lb (109.3 kg)  02/17/19 235 lb (106.6 kg)  04/20/18 224 lb (101.6 kg)    BP 130/82   Pulse (!) 110   Ht 5\' 7"  (1.702 m)   Wt 241 lb (109.3 kg)   BMI 37.75 kg/m   Assessment and Plan:  1. Sprain of calcaneofibular ligament of left ankle, initial encounter New onset.  Initial presentation for medical care.  Unstable.  Secondary to pain and difficulty ambulating.  Patient sustained a fall several weeks ago.  Patient sustained injury to the lateral aspect of the left ankle.  Patient is tender over the distal fibula and lateral malleolus.  We will obtain a x-ray of the ankle to determine the possibility of fracture in the meantime we will initiate meloxicam 15 mg once a day for pain relief and refer for ambulatory evaluation with orthopedic surgery for possibility of stabilization with the boot. - DG Ankle Complete Left; Future - Ambulatory referral to Orthopedic Surgery - meloxicam (MOBIC) 15 MG tablet; Take 1 tablet (15 mg total) by mouth daily.  Dispense: 30 tablet; Refill: 0

## 2020-03-05 ENCOUNTER — Ambulatory Visit (INDEPENDENT_AMBULATORY_CARE_PROVIDER_SITE_OTHER): Payer: 59

## 2020-03-05 ENCOUNTER — Other Ambulatory Visit: Payer: Self-pay

## 2020-03-05 ENCOUNTER — Encounter: Payer: Self-pay | Admitting: Internal Medicine

## 2020-03-05 ENCOUNTER — Ambulatory Visit: Payer: 59 | Admitting: Internal Medicine

## 2020-03-05 VITALS — BP 124/84 | HR 110 | Temp 98.0°F | Ht 67.0 in | Wt 242.2 lb

## 2020-03-05 DIAGNOSIS — M130 Polyarthritis, unspecified: Secondary | ICD-10-CM | POA: Diagnosis not present

## 2020-03-05 DIAGNOSIS — R635 Abnormal weight gain: Secondary | ICD-10-CM | POA: Diagnosis not present

## 2020-03-05 DIAGNOSIS — R200 Anesthesia of skin: Secondary | ICD-10-CM | POA: Diagnosis not present

## 2020-03-05 DIAGNOSIS — R202 Paresthesia of skin: Secondary | ICD-10-CM

## 2020-03-05 DIAGNOSIS — M5412 Radiculopathy, cervical region: Secondary | ICD-10-CM

## 2020-03-05 DIAGNOSIS — R0600 Dyspnea, unspecified: Secondary | ICD-10-CM | POA: Diagnosis not present

## 2020-03-05 DIAGNOSIS — M5030 Other cervical disc degeneration, unspecified cervical region: Secondary | ICD-10-CM

## 2020-03-05 DIAGNOSIS — M542 Cervicalgia: Secondary | ICD-10-CM

## 2020-03-05 DIAGNOSIS — R0609 Other forms of dyspnea: Secondary | ICD-10-CM

## 2020-03-05 DIAGNOSIS — R0683 Snoring: Secondary | ICD-10-CM

## 2020-03-05 DIAGNOSIS — N912 Amenorrhea, unspecified: Secondary | ICD-10-CM | POA: Diagnosis not present

## 2020-03-05 DIAGNOSIS — N911 Secondary amenorrhea: Secondary | ICD-10-CM

## 2020-03-05 DIAGNOSIS — R0989 Other specified symptoms and signs involving the circulatory and respiratory systems: Secondary | ICD-10-CM

## 2020-03-05 DIAGNOSIS — R0602 Shortness of breath: Secondary | ICD-10-CM

## 2020-03-05 DIAGNOSIS — M329 Systemic lupus erythematosus, unspecified: Secondary | ICD-10-CM

## 2020-03-05 IMAGING — DX DG CERVICAL SPINE COMPLETE 4+V
6 series · 6 of 6 positions shown · non-contrast
Comparison: No prior.

CLINICAL DATA: Neck pain with radicular symptoms both upper
extremities.

EXAM:
CERVICAL SPINE - COMPLETE 4+ VIEW

[cervical spine ap]
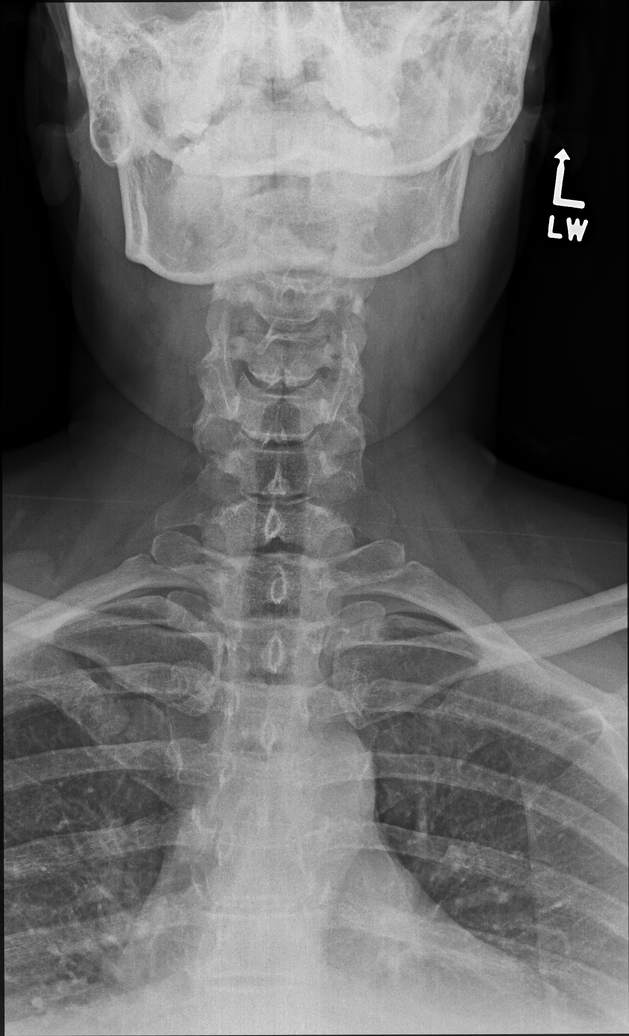

[cervical spine oblique (1 of 2)]
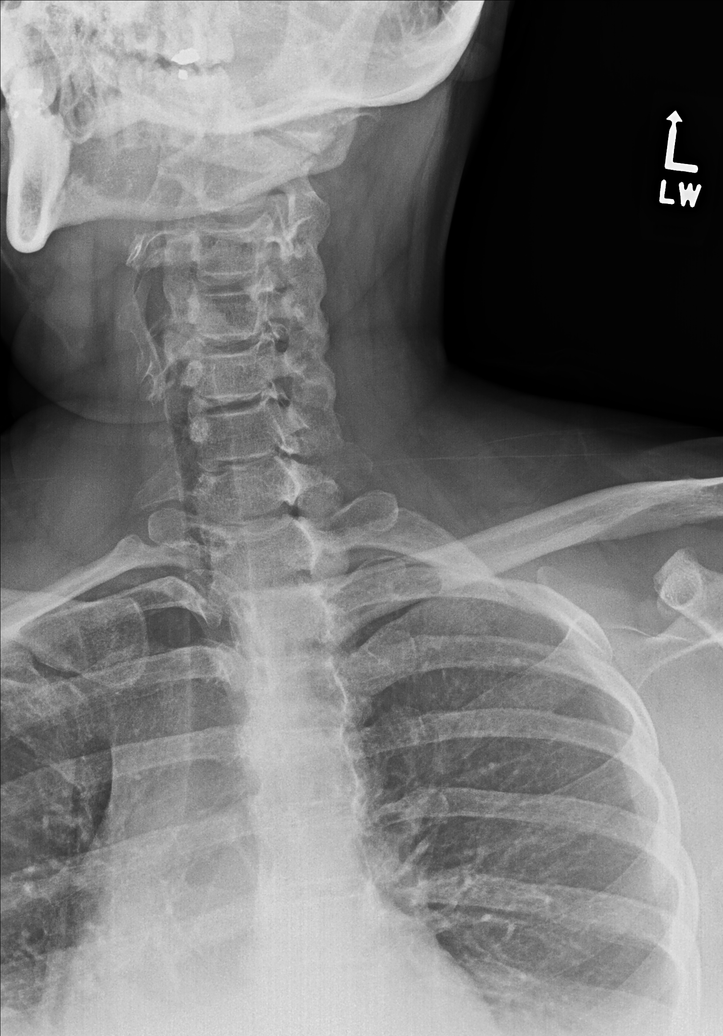

[cervical spine oblique (2 of 2)]
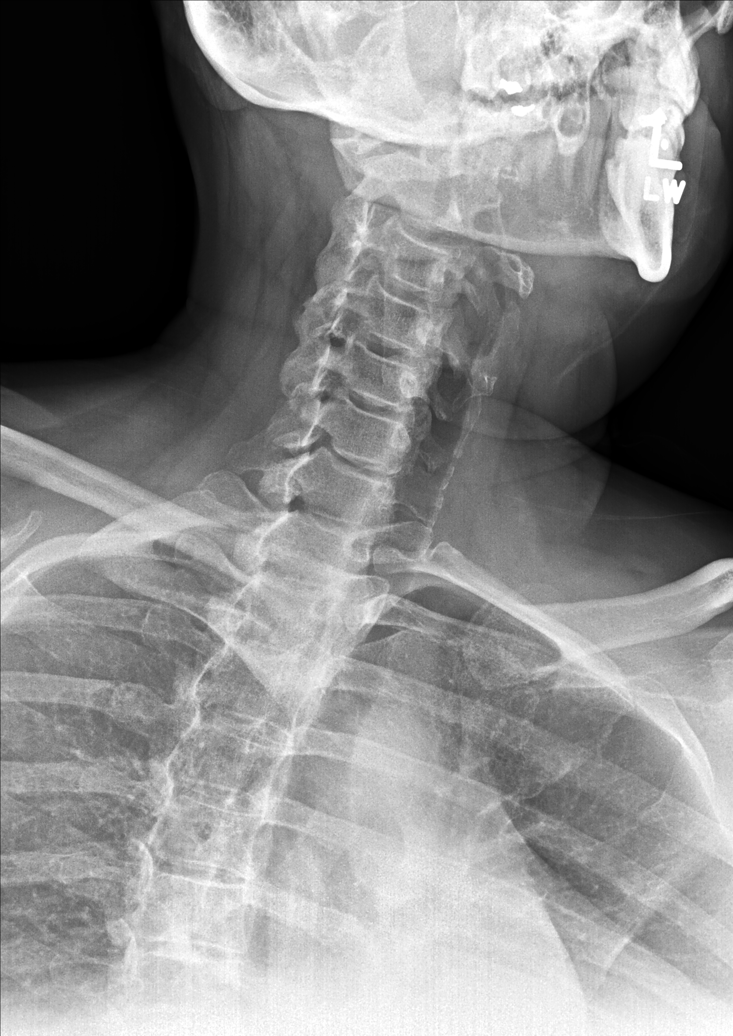

[cervical spine lat]
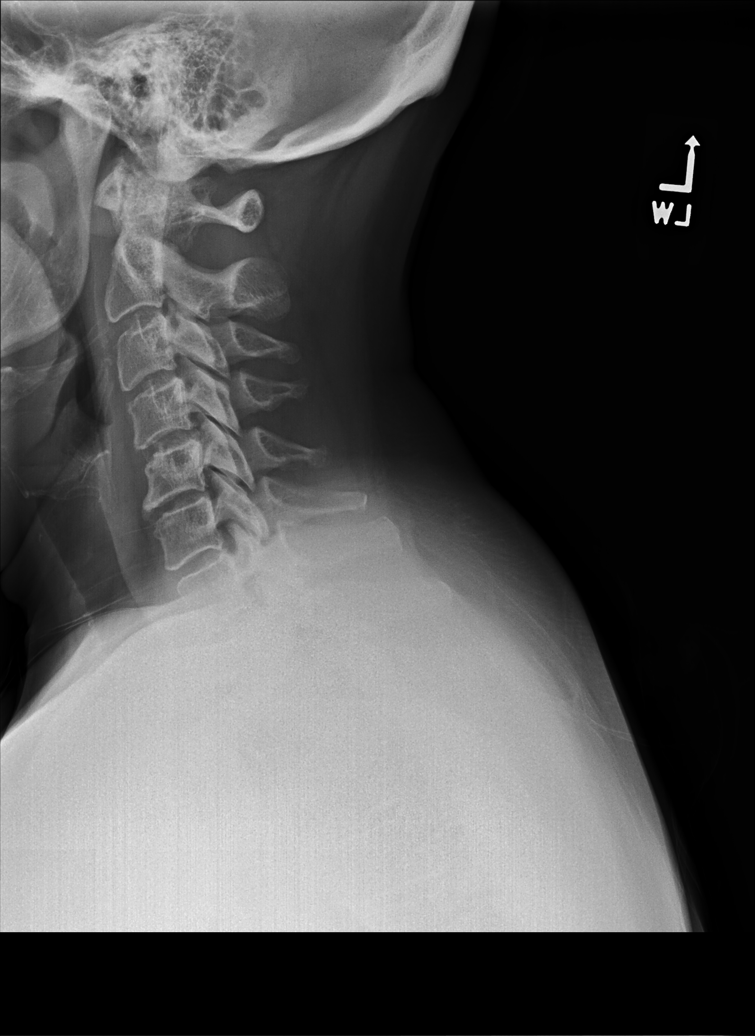

[swimmers lat]
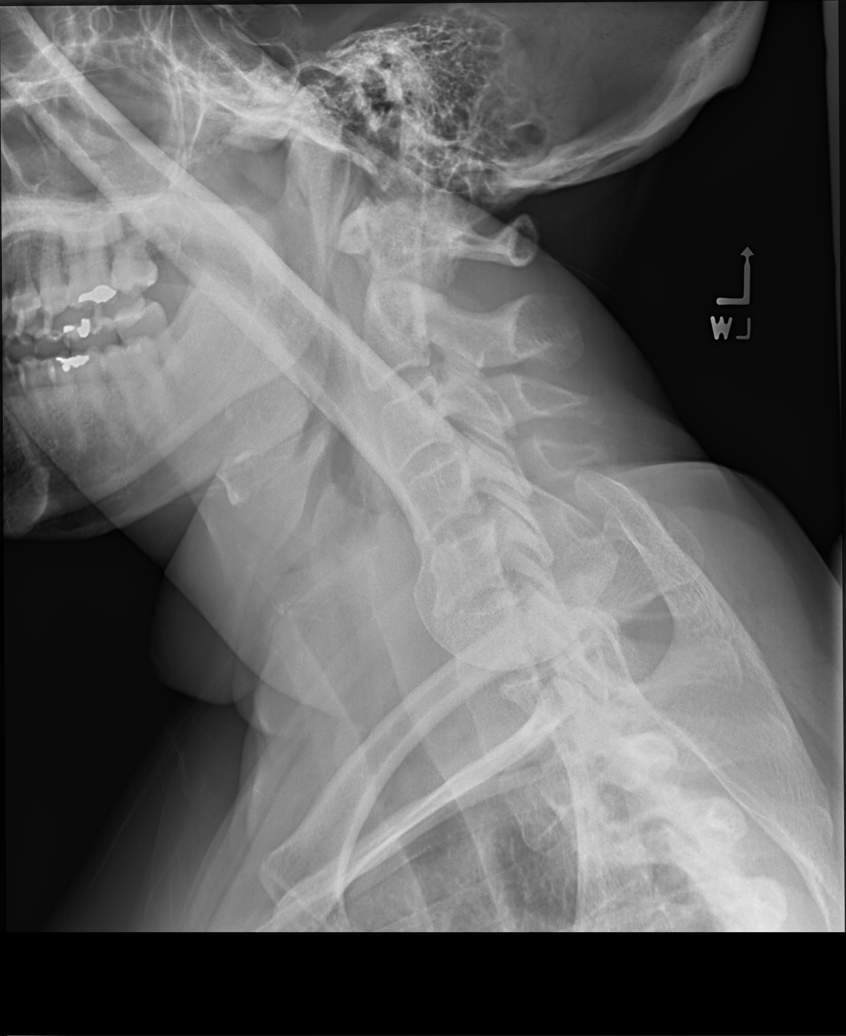

[cervical spine open mouth ap]
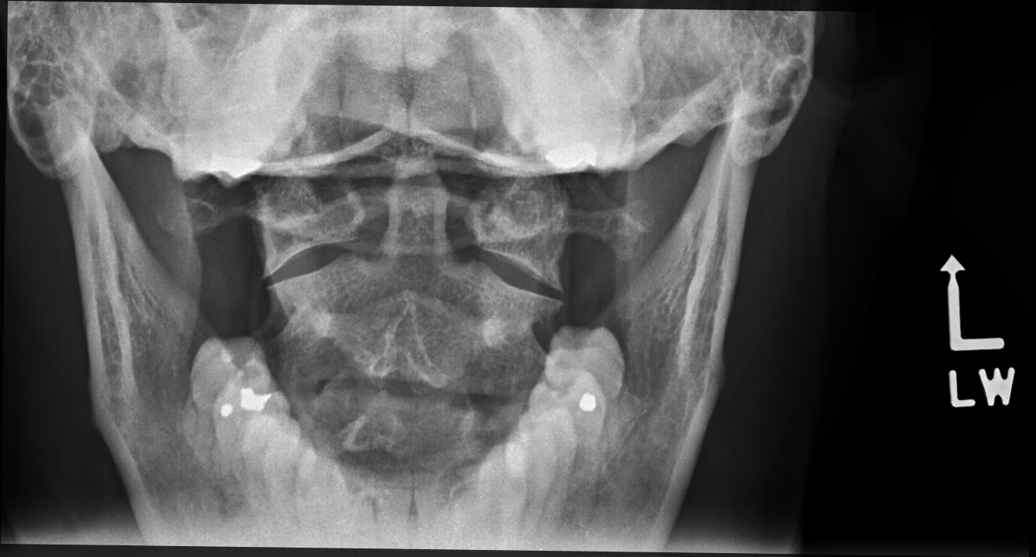

[6 of 6 positions shown; findings below may reference images not displayed]

FINDINGS: Diffuse mild degenerative change with mild multilevel disc space
loss and endplate osteophyte formation. Mild straightening of the
cervical spine. No acute bony abnormality identified. No evidence of
fracture or dislocation. Pulmonary apices are clear.
IMPRESSION: Diffuse multilevel degenerative change with mild straightening of
the cervical spine. No acute abnormality identified.

## 2020-03-05 IMAGING — DX DG CHEST 2V
2 series · 2 of 2 positions shown · non-contrast
Comparison: [DATE].

CLINICAL DATA: Chest tightness.  Shortness of breath.

EXAM:
CHEST - 2 VIEW

[chest pa]
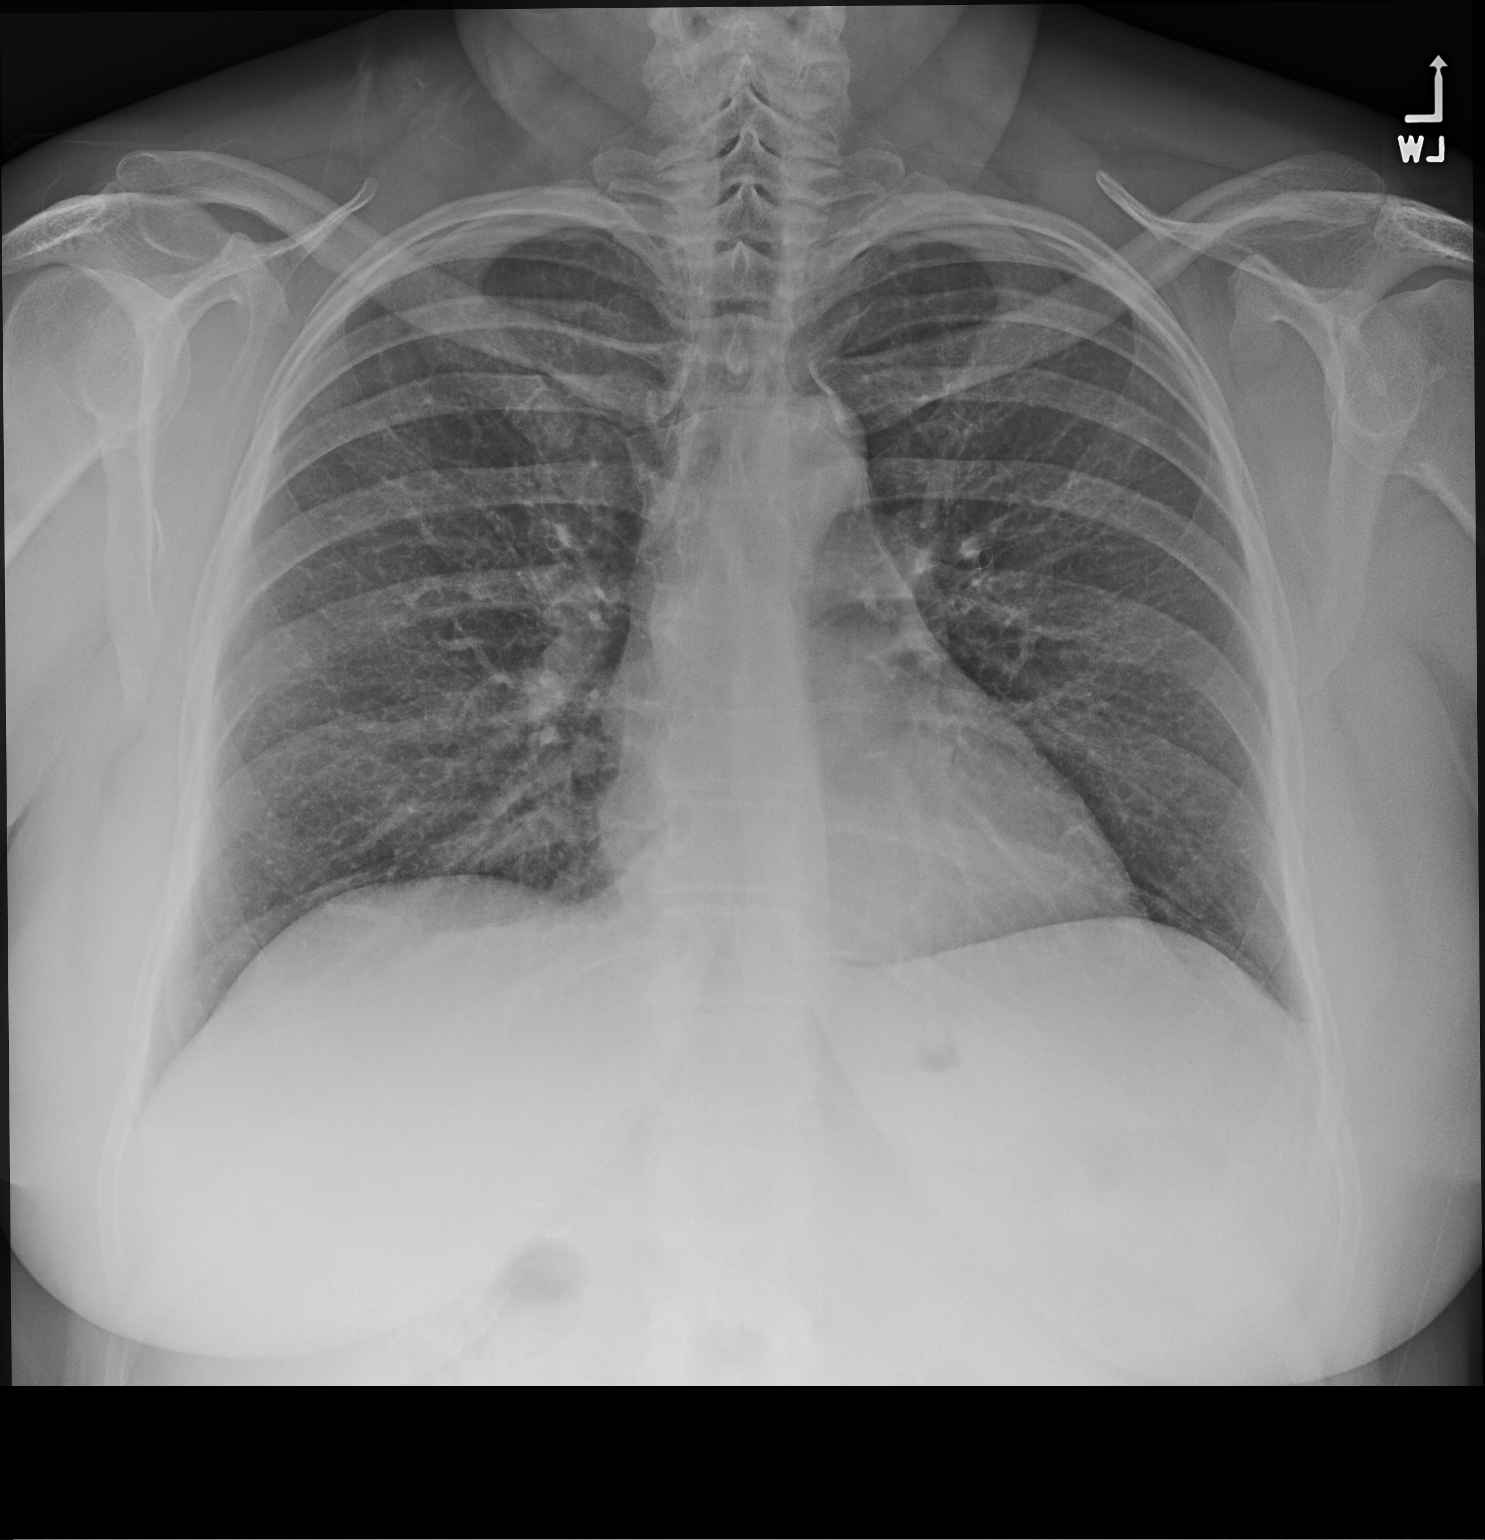

[chest lat]
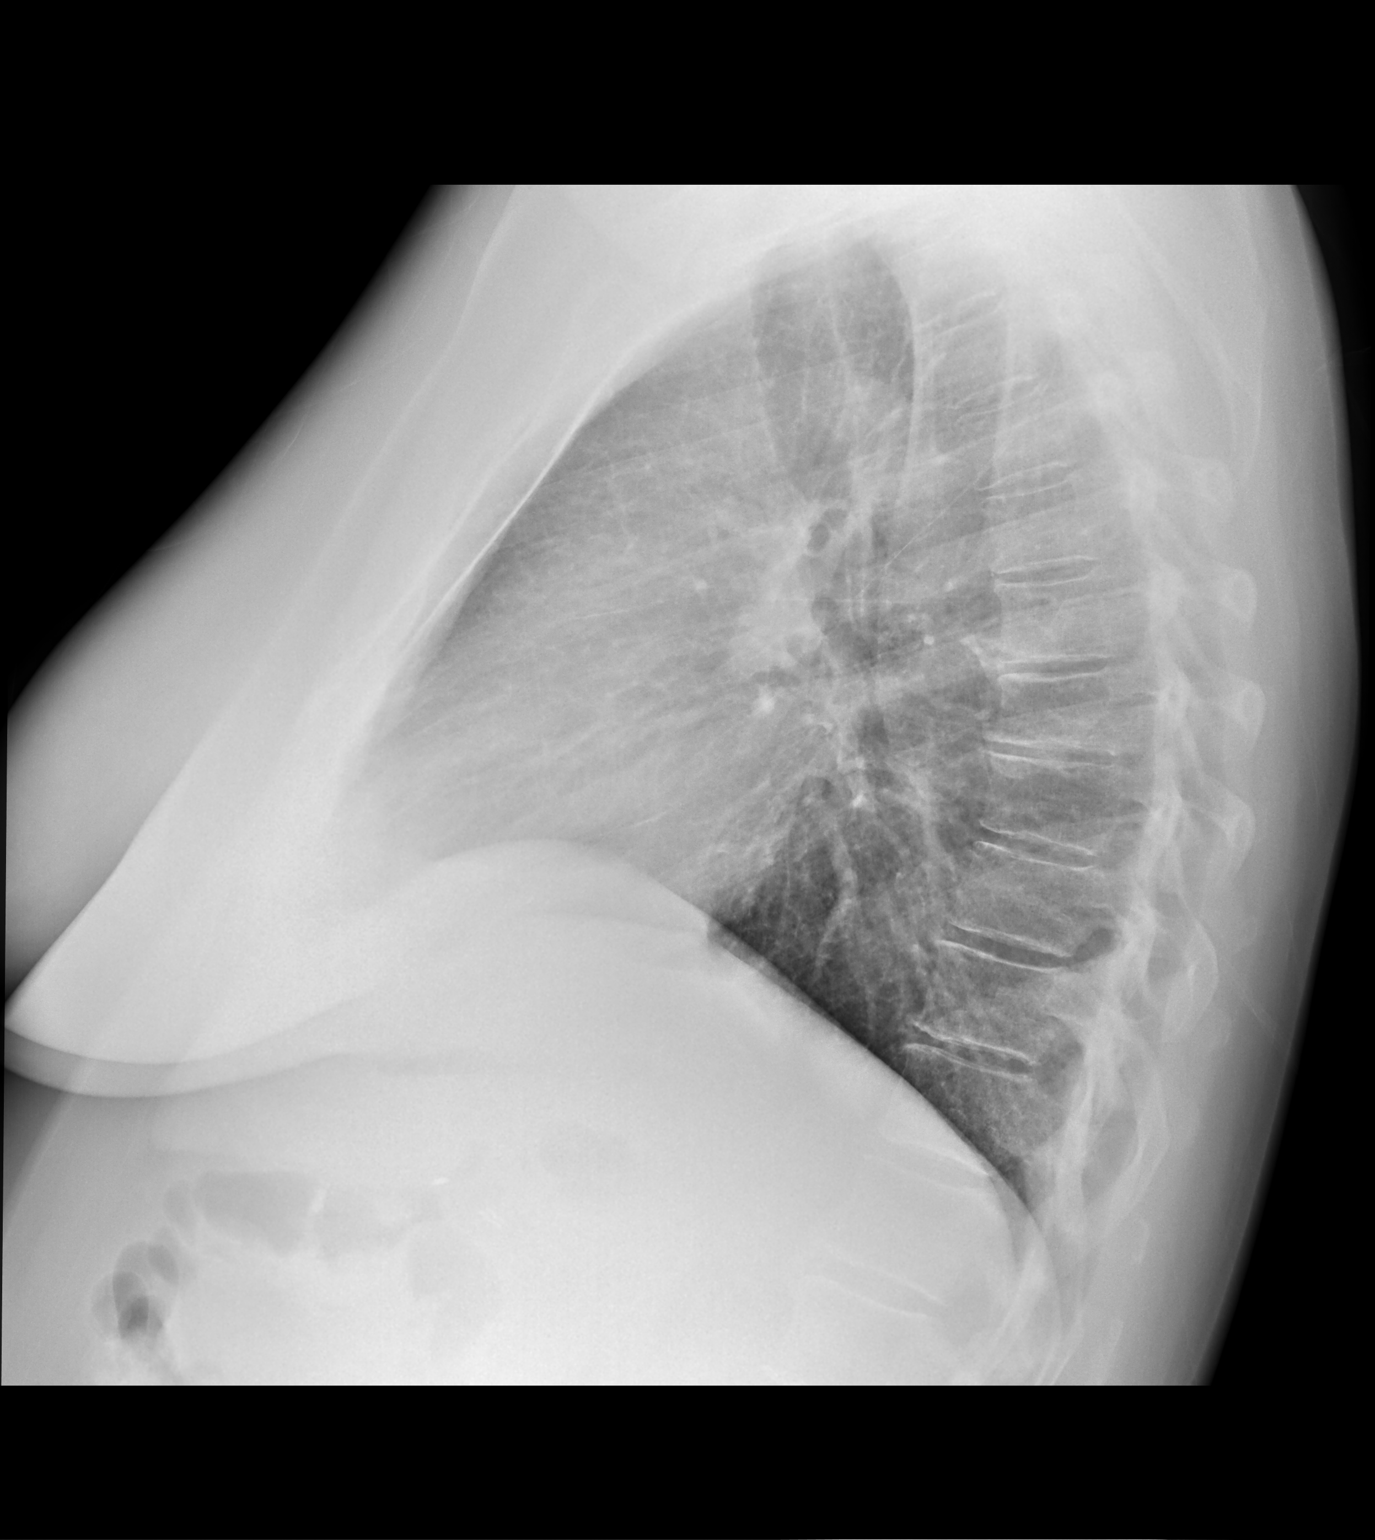

[2 of 2 positions shown; findings below may reference images not displayed]

FINDINGS: Mediastinum and hilar structures normal. Heart size normal. Mild
bilateral interstitial prominence a component of which may be
chronic. Mild pneumonitis cannot be excluded. No pleural effusion or
pneumothorax. No acute bony abnormality.
IMPRESSION: Mild bilateral interstitial prominence a component of which may be
chronic. Mild pneumonitis cannot be excluded.

## 2020-03-05 NOTE — Progress Notes (Signed)
Subjective:  Patient ID: Yolanda Young, female    DOB: 12-10-1965  Age: 54 y.o. MRN: [id number]  CC: The primary encounter diagnosis was Numbness and tingling of both feet. Diagnoses of Polyarthritis, Cervical radiculitis, Dyspnea on minimal exertion, Amenorrhea, Excessive weight gain, Snoring, Morbid obesity ([place]), Choking episode occurring at night, Polyarthritis involving elbow, Excessive body weight gain, Amenorrhea, secondary, Shortness of breath on exertion, Neck pain of over 3 months duration, and Other cervical disc degeneration, unspecified cervical region were also pertinent to this visit.  HPI ZAHIRAH NORGREN presents for establishment of care.  Referred by her mother Cecille Po . The change of venue is due to her frustration with her PCP's failure to work up the problems outlined below .  This visit occurred during the SARS-CoV-2 public health emergency.  Safety protocols were in place, including screening questions prior to the visit, additional usage of staff PPE, and extensive cleaning of exam room while observing appropriate contact time as indicated for disinfecting solutions.    Patient states that she has been "sick since 1999"  . She has not worked since October 2020 due to constant pain .  She is financially supported by her parents, but lives alone.   She states that she was diagnosed with SLE by Dr Vernona Rieger at [facility] in 1999 when she presented with joint pain .  She was referred for a  2nd opinion to  [facility],   Then moved to [place], [place] and received a dissenting opinion by a physician at [facility] who diagnosed fibromyalgia  In 2016. Marland Kitchen   She has had a feeling of pins and needles affecting bilateral  hands and feet since 2005.  No prior EMG nerve conduction studies done . Had MRI brain by neurologist at some point which noted  W/M changes but no progression or signs of MS.  Since then her  Symptoms have progressed in the lower extremities. Feet are very  painful in the morning until she gets up and get going. The symptoms are less noticeable during the day but become intolerable at night.   She reports that she began having pain in her elbows starting about  2 months ago,  Which is  Intermittent , and her arms develop numbness at night.  Neck and shoulders hurt constantly, and she has a history of surgery on the right shoulder    Panic attacks started in 2008 ,  Has 1-2 events per month,  Always while asleep  Wakes up unable to breathe and feels like she is choking .  No prior sleep study.  Told she snores for the past 6 months by her boyfriend.   For the past 6 months or longer,  Has been getting chest tightness,  Tachycardia  and shortness of breath with household chores .  Had a presyncopal episode last summer while mowing the lawn but did not seek medical assitance.  Weight problems : has gained 90 lbs since high school ,  60 of which have been gained  in the last 2 years.   He has had Urge Urinary incontinence since 2008,  Currently reports that for the past 6 months she has developed unawareness of need to void.   History of  nephrolithiasis ,  She believes she Passed a stone  a month ago after having right flank pain and hematuria which resolved spontaneously.   Endometrial ablation 8 years ago,  Has not had menses since then.   Last labs were in  2016    History Bessie has a past medical history of Colon polyp (02/04/2018), GERD (gastroesophageal reflux disease), History of hiatal hernia, Migraine, and Panic attacks.   She has a past surgical history that includes Ablation on endometriosis (2013); Shoulder surgery (Right); Colonoscopy with propofol (N/A, 02/04/2018); Esophagogastroduodenoscopy (egd) with propofol (N/A, 02/04/2018); and Cholecystectomy (N/A, 04/05/2018).   Her family history includes Cancer in her maternal grandfather and maternal grandmother; Crohn's disease in her father; Diabetes in her mother and paternal grandmother;  Stroke in her father.She reports that she quit smoking about 22 years ago. Her smoking use included cigarettes. She has a 1.00 pack-year smoking history. She has never used smokeless tobacco. She reports that she does not drink alcohol or use drugs.  Outpatient Medications Prior to Visit  Medication Sig Dispense Refill  . ibuprofen (ADVIL,MOTRIN) 200 MG tablet Take 600 mg by mouth every 6 (six) hours as needed for moderate pain.    . meloxicam (MOBIC) 15 MG tablet Take 1 tablet (15 mg total) by mouth daily. (Patient not taking: Reported on 03/05/2020) 30 tablet 0  . pantoprazole (PROTONIX) 40 MG tablet Take 1 tablet (40 mg total) by mouth daily. (Patient not taking: Reported on 12/29/2019) 30 tablet 5  . topiramate (TOPAMAX) 50 MG tablet Take 1 tablet (50 mg total) by mouth daily. (Patient not taking: Reported on 12/29/2019) 30 tablet 2   No facility-administered medications prior to visit.    Review of Systems:  Patient denies headache, fevers, malaise, unintentional weight loss, skin rash, eye pain, sinus congestion and sinus pain, sore throat, dysphagia,  hemoptysis , cough, dyspnea, wheezing, chest pain, palpitations, orthopnea, edema, abdominal pain, nausea, melena, diarrhea, constipation, flank pain, dysuria, hematuria, urinary  Frequency, nocturia, numbness, tingling, seizures,  Focal weakness, Loss of consciousness,  Tremor, insomnia, depression, anxiety, and suicidal ideation.     Objective:  BP 124/84   Pulse (!) 110   Temp 98 F (36.7 C) (Temporal)   Ht 5\' 7"  (1.702 m)   Wt 242 lb 3.2 oz (109.9 kg)   SpO2 97%   BMI 37.93 kg/m   Physical Exam:   Assessment & Plan:   Problem List Items Addressed This Visit      Unprioritized   Polyarthritis involving elbow    Past diagnosis of SLE is uncertain given chronicity and lack of records to review.  Exam is not consistent with SLE,  But will obtain serologies        Choking episode occurring at night    Accompanied by panic  attacks and snoring.  BMI is > 35,  Neck circumference is high.  OSA suspected,  Sleep study ordered       Relevant Orders   Ambulatory referral to Sleep Studies   Excessive body weight gain    She reports a weight gain of 60 lbs in the last 2 years with no workup by her former PCP.  Records indicate a slower increase (34 lbs since 2017).  I have addressed  BMI and recommended a low glycemic index diet utilizing smaller Young frequent meals to increase metabolism. Thyroid function is normal.  Screening for lipid disordersand diabetes to be done today.  Lab Results  Component Value Date   TSH 2.17 03/05/2020          Amenorrhea, secondary    Secondary to endometrial ablation 8 years ago.  Menopause status undetermined  FSH.LH pending       Shortness of breath on exertion    Her  exam is normal and sats are normal.  No signs of CHF on exam or  Chest  Xray equivocal for pneumonitis but history not supportive given absence of cough, hypoxia or recent illness.      Neck pain of over 3 months duration    She has evidence of cervical  disk disease (degenerative) on plain films , with muscle spasm noted .  Given the numbness and tingling of hands and feet along with new onset bladder incontinence without urge , She will need a cervical spine MRI to rule out degenerative disk disease causing spinal stenosis       Relevant Orders   MR Cervical Spine Wo Contrast    Other Visit Diagnoses    Numbness and tingling of both feet    -  Primary   Relevant Orders   Vitamin B12   RBC Folate   MR Cervical Spine Wo Contrast   Polyarthritis       Relevant Orders   Sedimentation rate   C-reactive protein   ANA   Cervical radiculitis       Relevant Orders   DG Cervical Spine Complete (Completed)   MR Cervical Spine Wo Contrast   Dyspnea on minimal exertion       Relevant Orders   CBC with Differential/Platelet   DG Chest 2 View (Completed)   Amenorrhea       Relevant Orders   LH   Follicle  stimulating hormone   Excessive weight gain       Relevant Orders   Thyroid Panel With TSH (Completed)   Comprehensive metabolic panel   Hemoglobin A1c   Snoring       Relevant Orders   Ambulatory referral to Sleep Studies   Morbid obesity ([place])       Relevant Orders   Ambulatory referral to Sleep Studies   Other cervical disc degeneration, unspecified cervical region       Relevant Orders   MR Cervical Spine Wo Contrast     A total of 60 minutes of face to face time was spent with patient Young than half of which was spent in counselling and coordination of care  I have discontinued Luretha A. Twyman's topiramate, pantoprazole, and meloxicam. I am also having her maintain her ibuprofen.  No orders of the defined types were placed in this encounter.   Medications Discontinued During This Encounter  Medication Reason  . meloxicam (MOBIC) 15 MG tablet   . topiramate (TOPAMAX) 50 MG tablet   . pantoprazole (PROTONIX) 40 MG tablet     Follow-up: Return in about 4 weeks (around 04/02/2020).   Crecencio Mc, MD

## 2020-03-05 NOTE — Patient Instructions (Signed)
X rays today of cervical spine (neck) and chest  Sleep study recommended to rule out sleep apnea   Labs today to rule out active inflammation,  Thyroid  disease diabetes etc

## 2020-03-06 ENCOUNTER — Other Ambulatory Visit: Payer: Self-pay | Admitting: Internal Medicine

## 2020-03-06 DIAGNOSIS — M542 Cervicalgia: Secondary | ICD-10-CM | POA: Insufficient documentation

## 2020-03-06 DIAGNOSIS — R0989 Other specified symptoms and signs involving the circulatory and respiratory systems: Secondary | ICD-10-CM | POA: Insufficient documentation

## 2020-03-06 DIAGNOSIS — R635 Abnormal weight gain: Secondary | ICD-10-CM | POA: Insufficient documentation

## 2020-03-06 DIAGNOSIS — R0609 Other forms of dyspnea: Secondary | ICD-10-CM | POA: Insufficient documentation

## 2020-03-06 DIAGNOSIS — M130 Polyarthritis, unspecified: Secondary | ICD-10-CM | POA: Insufficient documentation

## 2020-03-06 DIAGNOSIS — R0602 Shortness of breath: Secondary | ICD-10-CM | POA: Insufficient documentation

## 2020-03-06 DIAGNOSIS — N911 Secondary amenorrhea: Secondary | ICD-10-CM | POA: Insufficient documentation

## 2020-03-06 DIAGNOSIS — R748 Abnormal levels of other serum enzymes: Secondary | ICD-10-CM

## 2020-03-06 LAB — CBC WITH DIFFERENTIAL/PLATELET
Basophils Absolute: 0.1 10*3/uL (ref 0.0–0.1)
Basophils Relative: 0.9 % (ref 0.0–3.0)
Eosinophils Absolute: 0.1 10*3/uL (ref 0.0–0.7)
Eosinophils Relative: 1.3 % (ref 0.0–5.0)
HCT: 44.1 % (ref 36.0–46.0)
Hemoglobin: 14.9 g/dL (ref 12.0–15.0)
Lymphocytes Relative: 32.4 % (ref 12.0–46.0)
Lymphs Abs: 2.7 10*3/uL (ref 0.7–4.0)
MCHC: 33.7 g/dL (ref 30.0–36.0)
MCV: 94 fl (ref 78.0–100.0)
Monocytes Absolute: 0.7 10*3/uL (ref 0.1–1.0)
Monocytes Relative: 8 % (ref 3.0–12.0)
Neutro Abs: 4.8 10*3/uL (ref 1.4–7.7)
Neutrophils Relative %: 57.4 % (ref 43.0–77.0)
Platelets: 255 10*3/uL (ref 150.0–400.0)
RBC: 4.69 Mil/uL (ref 3.87–5.11)
RDW: 13.4 % (ref 11.5–15.5)
WBC: 8.3 10*3/uL (ref 4.0–10.5)

## 2020-03-06 LAB — COMPREHENSIVE METABOLIC PANEL
ALT: 50 U/L — ABNORMAL HIGH (ref 0–35)
AST: 32 U/L (ref 0–37)
Albumin: 4.5 g/dL (ref 3.5–5.2)
Alkaline Phosphatase: 142 U/L — ABNORMAL HIGH (ref 39–117)
BUN: 12 mg/dL (ref 6–23)
CO2: 21 mEq/L (ref 19–32)
Calcium: 10 mg/dL (ref 8.4–10.5)
Chloride: 102 mEq/L (ref 96–112)
Creatinine, Ser: 0.87 mg/dL (ref 0.40–1.20)
GFR: 68.29 mL/min (ref >60.00)
Glucose, Bld: 97 mg/dL (ref 70–99)
Potassium: 4.6 mEq/L (ref 3.5–5.1)
Sodium: 138 mEq/L (ref 135–145)
Total Bilirubin: 0.5 mg/dL (ref 0.2–1.2)
Total Protein: 7.7 g/dL (ref 6.0–8.3)

## 2020-03-06 LAB — LUTEINIZING HORMONE: LH: 54.5 m[IU]/mL

## 2020-03-06 LAB — SEDIMENTATION RATE: Sed Rate: 51 mm/hr — ABNORMAL HIGH (ref 0–30)

## 2020-03-06 LAB — VITAMIN B12: Vitamin B-12: 350 pg/mL (ref 211–911)

## 2020-03-06 LAB — HEMOGLOBIN A1C: Hgb A1c MFr Bld: 5.9 % (ref 4.6–6.5)

## 2020-03-06 LAB — FOLLICLE STIMULATING HORMONE: FSH: 102.1 m[IU]/mL

## 2020-03-06 LAB — C-REACTIVE PROTEIN: CRP: 1 mg/dL (ref 0.5–20.0)

## 2020-03-06 NOTE — Addendum Note (Signed)
Addended by: Crecencio Mc on: 03/06/2020 10:01 PM   Modules accepted: Orders

## 2020-03-06 NOTE — Assessment & Plan Note (Signed)
Secondary to endometrial ablation 8 years ago.  Menopause status undetermined  FSH.LH pending

## 2020-03-06 NOTE — Assessment & Plan Note (Addendum)
She has evidence of cervical  disk disease (degenerative) on plain films , with muscle spasm noted .  Given the numbness and tingling of hands and feet along with new onset bladder incontinence without urge , She will need a cervical spine MRI to rule out degenerative disk disease causing spinal stenosis

## 2020-03-06 NOTE — Assessment & Plan Note (Addendum)
Her exam is normal and sats are normal.  No signs of CHF on exam or  Chest  Xray equivocal for pneumonitis but history not supportive given absence of cough, hypoxia or recent illness.

## 2020-03-06 NOTE — Assessment & Plan Note (Signed)
Accompanied by panic attacks and snoring.  BMI is > 35,  Neck circumference is high.  OSA suspected,  Sleep study ordered

## 2020-03-06 NOTE — Assessment & Plan Note (Signed)
Past diagnosis of SLE is uncertain given chronicity and lack of records to review.  Exam is not consistent with SLE,  But will obtain serologies

## 2020-03-06 NOTE — Assessment & Plan Note (Addendum)
She reports a weight gain of 60 lbs in the last 2 years with no workup by her former PCP.  Records indicate a slower increase (34 lbs since 2017).  I have addressed  BMI and recommended a low glycemic index diet utilizing smaller more frequent meals to increase metabolism. Thyroid function is normal.  Screening for lipid disordersand diabetes to be done today.  Lab Results  Component Value Date   TSH 2.17 03/05/2020

## 2020-03-07 LAB — THYROID PANEL WITH TSH
Free Thyroxine Index: 2.4 (ref 1.4–3.8)
T3 Uptake: 27 % (ref 22–35)
T4, Total: 8.9 ug/dL (ref 5.1–11.9)
TSH: 2.17 mIU/L

## 2020-03-07 LAB — ANA: Anti Nuclear Antibody (ANA): POSITIVE — AB

## 2020-03-07 LAB — FOLATE RBC: RBC Folate: 723 ng/mL RBC (ref >280)

## 2020-03-07 LAB — ANTI-NUCLEAR AB-TITER (ANA TITER): ANA Titer 1: 1:320 {titer} — ABNORMAL HIGH

## 2020-03-08 ENCOUNTER — Telehealth: Payer: Self-pay

## 2020-03-08 DIAGNOSIS — R748 Abnormal levels of other serum enzymes: Secondary | ICD-10-CM

## 2020-03-08 NOTE — Telephone Encounter (Signed)
Ordered has been corrected

## 2020-03-09 NOTE — Addendum Note (Signed)
Addended by: Crecencio Mc on: 03/09/2020 12:48 PM   Modules accepted: Orders

## 2020-03-13 ENCOUNTER — Other Ambulatory Visit: Payer: Self-pay

## 2020-03-13 ENCOUNTER — Ambulatory Visit
Admission: RE | Admit: 2020-03-13 | Discharge: 2020-03-13 | Disposition: A | Discharge status: Home / Self Care | Payer: 59 | Source: Ambulatory Visit | Attending: Internal Medicine | Admitting: Internal Medicine

## 2020-03-13 DIAGNOSIS — R748 Abnormal levels of other serum enzymes: Secondary | ICD-10-CM | POA: Insufficient documentation

## 2020-03-13 IMAGING — US US ABDOMEN LIMITED
1 series · 14 of 25 positions shown · non-contrast
Comparison: [DATE]

CLINICAL DATA: Elevated LFTs

EXAM:
ULTRASOUND ABDOMEN LIMITED RIGHT UPPER QUADRANT

[Series 1: us abdomen limited ruq · 14 of 43 slices shown]
[im 1/43]
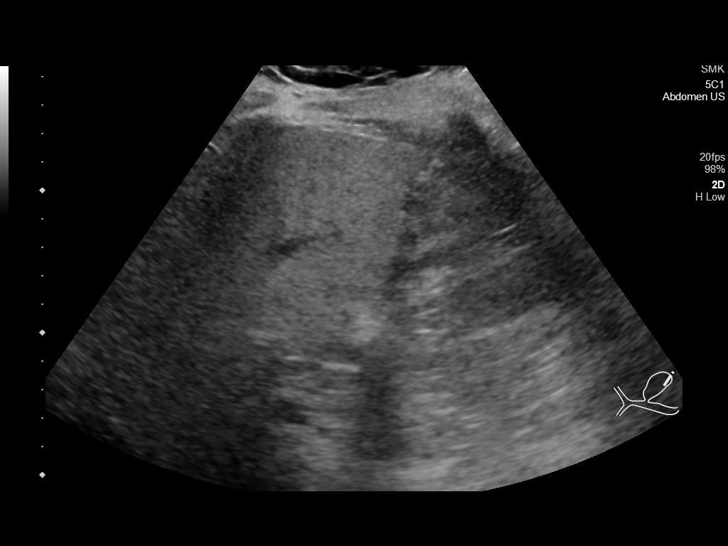
[im 4/43]
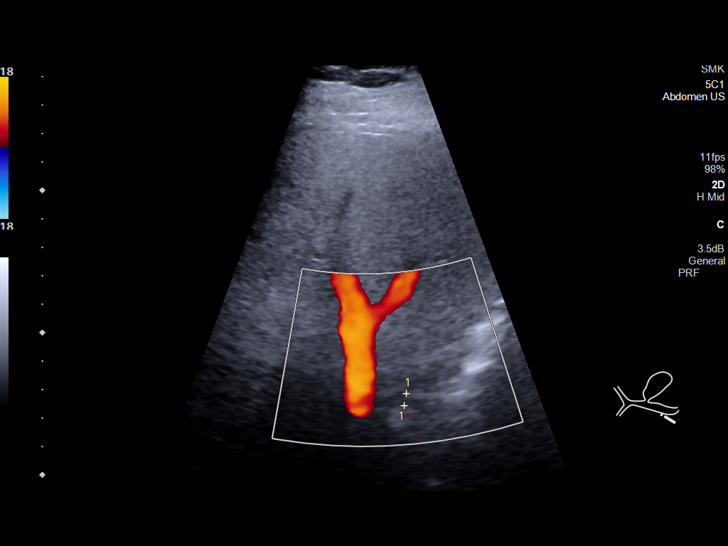
[im 8/43]
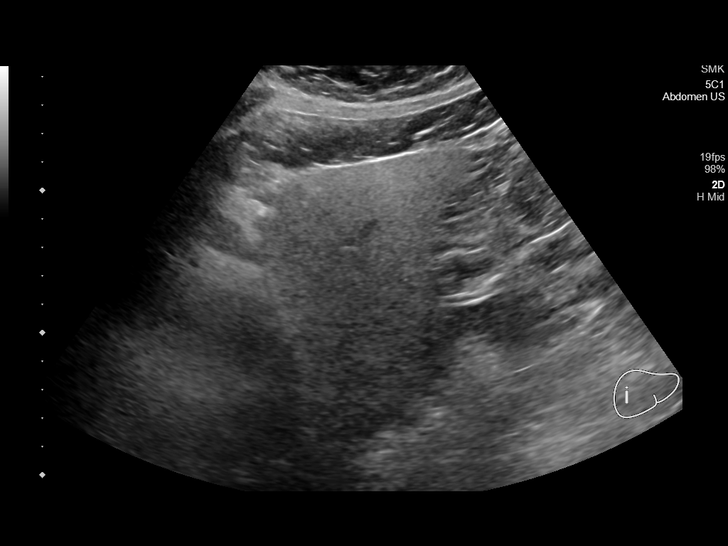
[im 11/43]
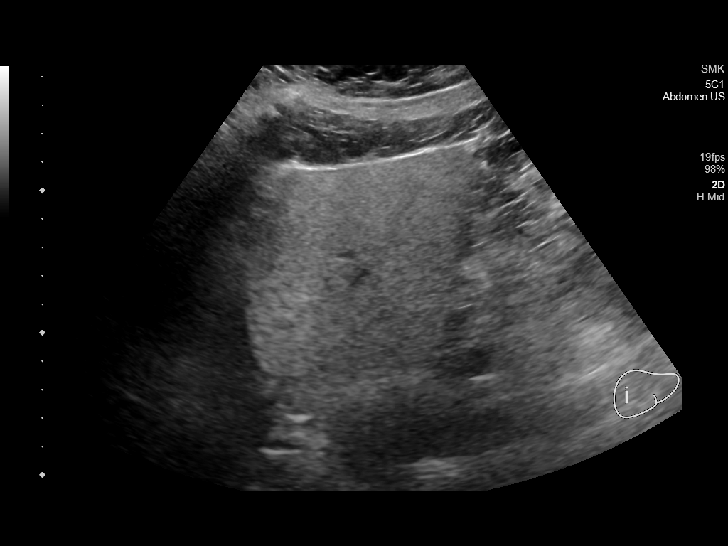
[im 15/43]
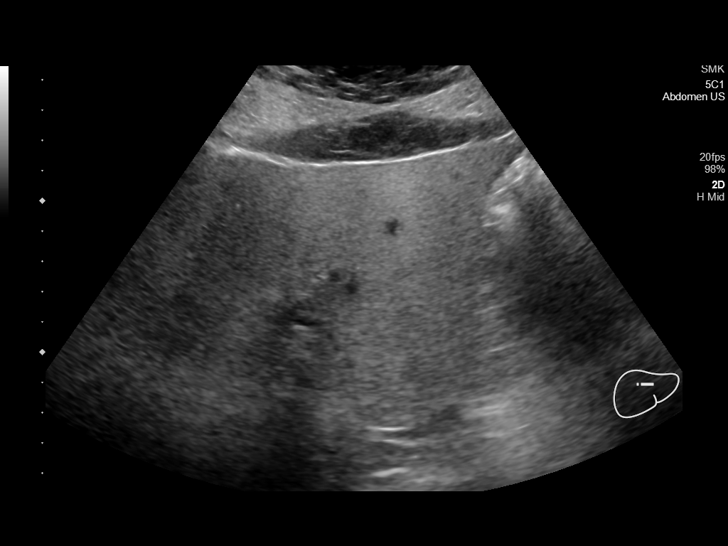
[im 16/43]
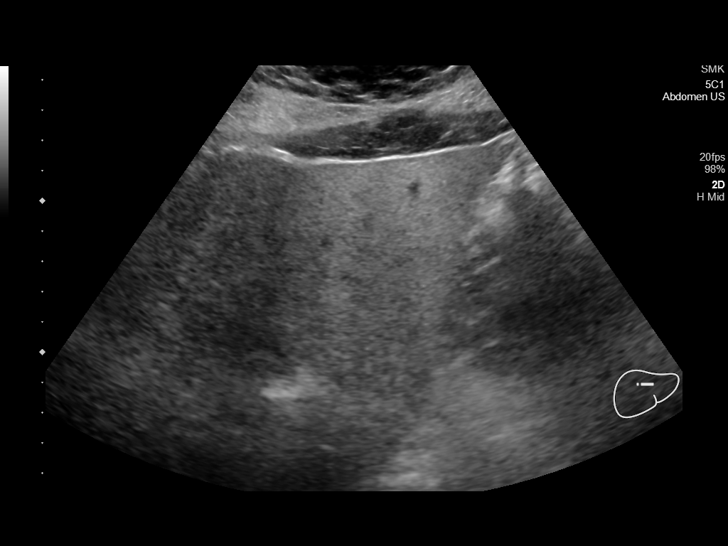
[im 20/43]
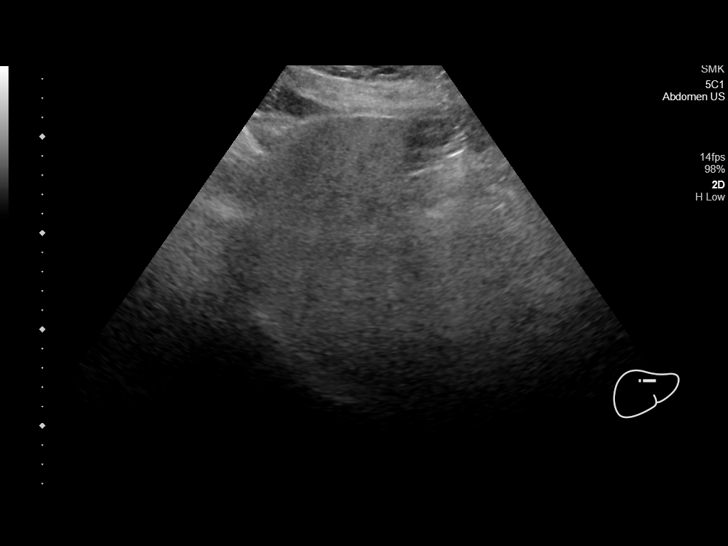
[im 23/43]
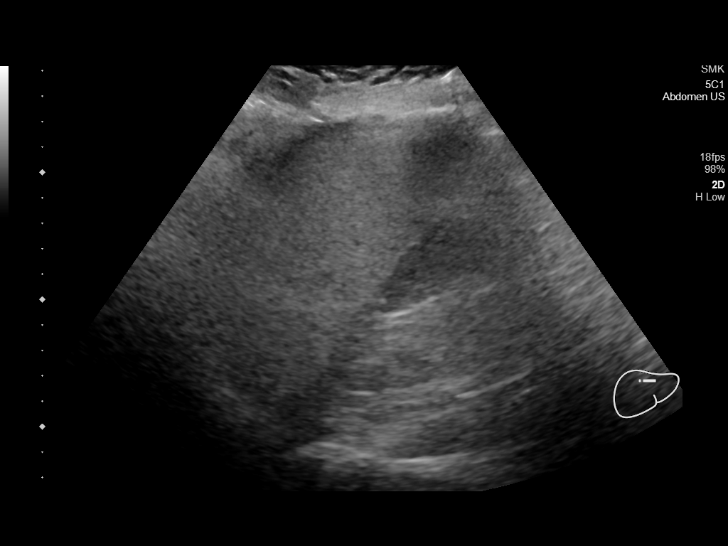
[im 27/43]
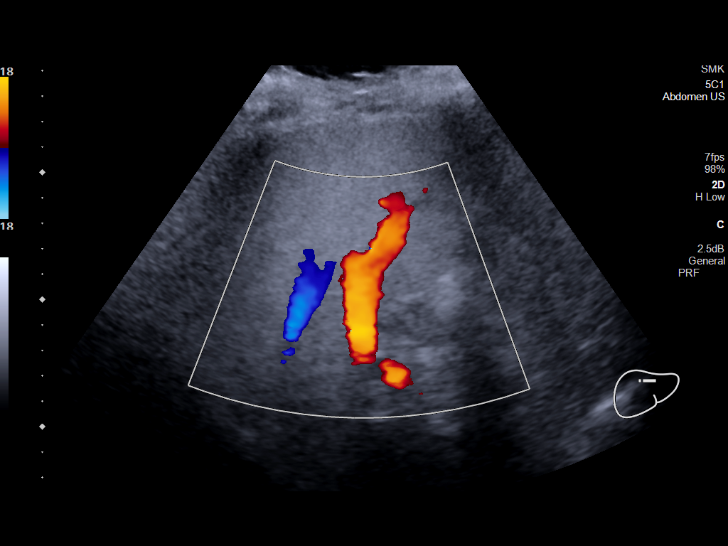
[im 29/43]
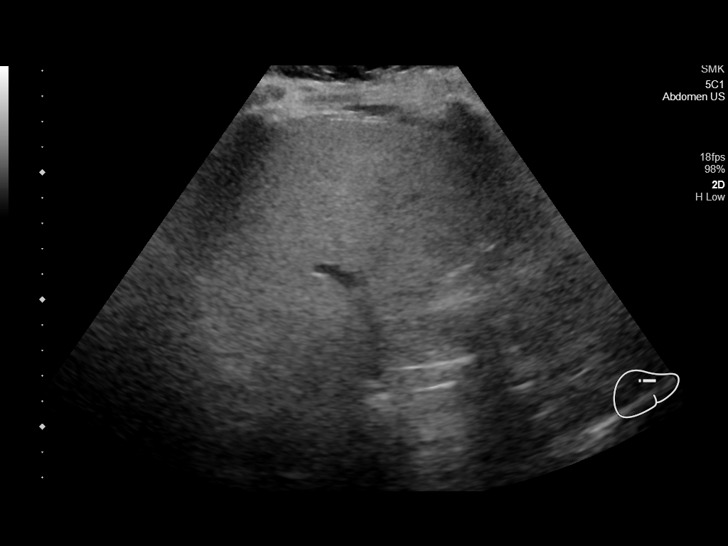
[im 32/43]
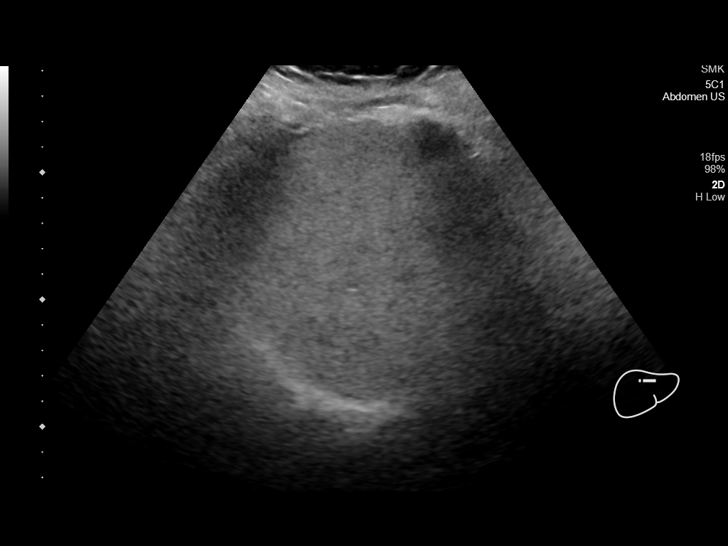
[im 36/43]
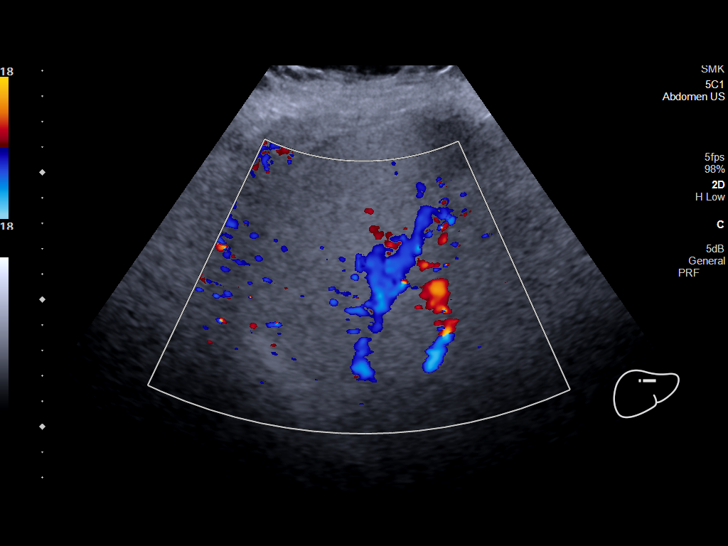
[im 39/43]
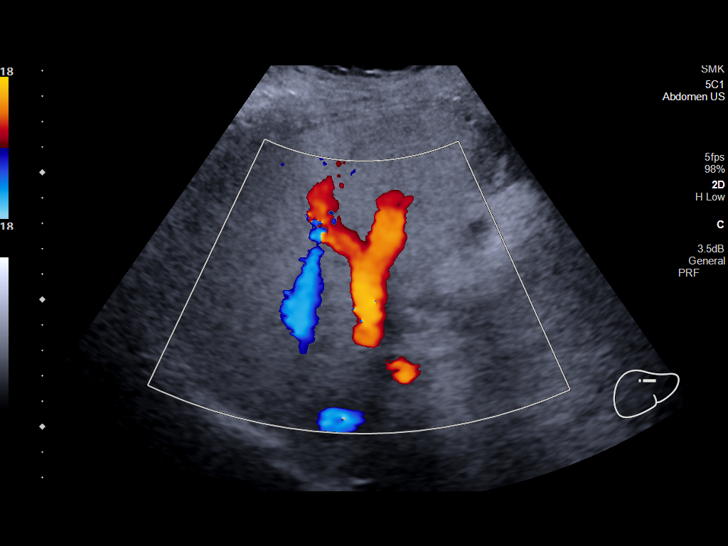
[im 43/43]
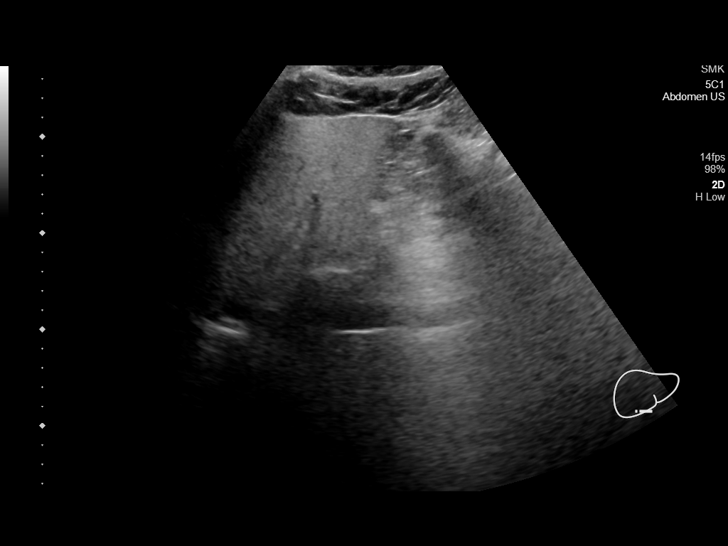

[14 of 25 positions shown; findings below may reference images not displayed]

FINDINGS: Gallbladder:

Surgically absent

Common bile duct:

Diameter: 4 mm, normal

Liver:

Echogenic parenchyma, likely fatty infiltration though this can be
seen with cirrhosis and certain infiltrative disorders. No focal
hepatic mass or nodularity grossly identified though assessment of
intrahepatic detail is limited by sound attenuation. Portal vein is
patent on color Doppler imaging with normal direction of blood flow
towards the liver.

Other: No RIGHT upper quadrant free fluid.
IMPRESSION: Post cholecystectomy.

Probable fatty infiltration of liver as above.

No definite hepatic sonographic abnormalities though intrahepatic
assessment is limited by sound attenuation; if further hepatic
assessment is required, recommend MR or CT.

## 2020-03-16 ENCOUNTER — Other Ambulatory Visit (INDEPENDENT_AMBULATORY_CARE_PROVIDER_SITE_OTHER): Payer: 59

## 2020-03-16 ENCOUNTER — Other Ambulatory Visit: Payer: Self-pay

## 2020-03-16 DIAGNOSIS — R748 Abnormal levels of other serum enzymes: Secondary | ICD-10-CM

## 2020-03-16 LAB — IBC + FERRITIN
Ferritin: 126.8 ng/mL (ref 10.0–291.0)
Iron: 129 ug/dL (ref 42–145)
Saturation Ratios: 39 % (ref 20.0–50.0)
Transferrin: 236 mg/dL (ref 212.0–360.0)

## 2020-03-17 LAB — ANA W/REFLEX IF POSITIVE
Anti JO-1: <0.2 AI (ref 0.0–0.9)
Anti Nuclear Antibody (ANA): POSITIVE — AB
Centromere Ab Screen: <0.2 AI (ref 0.0–0.9)
Chromatin Ab SerPl-aCnc: 1 AI — ABNORMAL HIGH (ref 0.0–0.9)
ENA RNP Ab: <0.2 AI (ref 0.0–0.9)
ENA SM Ab Ser-aCnc: <0.2 AI (ref 0.0–0.9)
ENA SSA (RO) Ab: <0.2 AI (ref 0.0–0.9)
ENA SSB (LA) Ab: <0.2 AI (ref 0.0–0.9)
Scleroderma (Scl-70) (ENA) Antibody, IgG: 0.2 AI (ref 0.0–0.9)
dsDNA Ab: <1 [IU]/mL (ref 0–9)

## 2020-03-19 LAB — ANTI-SMITH ANTIBODY: ENA SM Ab Ser-aCnc: <1 AI

## 2020-03-20 LAB — HEPATITIS B SURFACE ANTIBODY,QUALITATIVE: Hep B S Ab: REACTIVE — AB

## 2020-03-20 LAB — HEPATITIS B SURFACE ANTIGEN: Hepatitis B Surface Ag: NONREACTIVE

## 2020-03-20 LAB — MITOCHONDRIAL ANTIBODIES: Mitochondrial M2 Ab, IgG: 44.5 U — ABNORMAL HIGH

## 2020-03-20 LAB — HEPATITIS C ANTIBODY
Hepatitis C Ab: NONREACTIVE
SIGNAL TO CUT-OFF: 0.01 (ref <1.00)

## 2020-03-20 LAB — HEPATITIS B CORE ANTIBODY, TOTAL: Hep B Core Total Ab: NONREACTIVE

## 2020-03-22 ENCOUNTER — Telehealth: Payer: Self-pay | Admitting: Internal Medicine

## 2020-03-22 DIAGNOSIS — G629 Polyneuropathy, unspecified: Secondary | ICD-10-CM | POA: Insufficient documentation

## 2020-03-22 NOTE — Telephone Encounter (Signed)
lmom Unable to reach pt thank you for referral " [facility] said about 2 hours ago  "lmom" [facility] said 6 days ago  "Rejection Reason - Patient did not respond" [facility] said about 2 hours ago

## 2020-04-03 ENCOUNTER — Encounter: Payer: Self-pay | Admitting: Internal Medicine

## 2020-04-03 ENCOUNTER — Other Ambulatory Visit: Payer: Self-pay

## 2020-04-03 ENCOUNTER — Ambulatory Visit: Payer: 59 | Admitting: Internal Medicine

## 2020-04-03 VITALS — BP 120/82 | HR 105 | Temp 97.9°F | Resp 15 | Ht 67.0 in | Wt 239.6 lb

## 2020-04-03 DIAGNOSIS — M545 Low back pain, unspecified: Secondary | ICD-10-CM

## 2020-04-03 DIAGNOSIS — M130 Polyarthritis, unspecified: Secondary | ICD-10-CM

## 2020-04-03 DIAGNOSIS — M542 Cervicalgia: Secondary | ICD-10-CM

## 2020-04-03 DIAGNOSIS — K760 Fatty (change of) liver, not elsewhere classified: Secondary | ICD-10-CM

## 2020-04-03 DIAGNOSIS — R7401 Elevation of levels of liver transaminase levels: Secondary | ICD-10-CM

## 2020-04-03 DIAGNOSIS — G629 Polyneuropathy, unspecified: Secondary | ICD-10-CM | POA: Diagnosis not present

## 2020-04-03 DIAGNOSIS — R519 Headache, unspecified: Secondary | ICD-10-CM

## 2020-04-03 DIAGNOSIS — G8929 Other chronic pain: Secondary | ICD-10-CM | POA: Insufficient documentation

## 2020-04-03 DIAGNOSIS — R0602 Shortness of breath: Secondary | ICD-10-CM

## 2020-04-03 MED ORDER — TRAMADOL HCL 50 MG PO TABS: 50.0000 mg | ORAL_TABLET | Freq: Four times a day (QID) | ORAL | 0 refills | Status: DC | PRN

## 2020-04-03 MED ORDER — PREDNISONE 10 MG PO TABS: ORAL_TABLET | ORAL | 0 refills | Status: DC

## 2020-04-03 NOTE — Assessment & Plan Note (Signed)
Likely secondary to weight gain.  Vital signs are stable

## 2020-04-03 NOTE — Assessment & Plan Note (Signed)
Suggested by [place].  etiology unclear .   Weight gain vs autoimmune  .  Advised to avoid tylenol and motrin,  And alcohol.

## 2020-04-03 NOTE — Assessment & Plan Note (Addendum)
Acute , present for 1 week secondary to lifting incident.  Radiating to buttock/thigh. Adding prednisone and tramadol to tizanidine

## 2020-04-03 NOTE — Assessment & Plan Note (Signed)
Rheumatologic workup underway with positive ANA

## 2020-04-03 NOTE — Assessment & Plan Note (Addendum)
With recurrent occipital headaches and radiculopathy to both arms. Neurology referral for EMG /[place] studies prior to MRI cervical spine

## 2020-04-03 NOTE — Progress Notes (Signed)
Subjective:  Patient ID: Yolanda Young, female    DOB: 08-07-1966  Age: 54 y.o. MRN: [id number]  CC: The primary encounter diagnosis was Elevated ALT measurement. Diagnoses of Neuropathy, Recurrent occipital headache, Back pain at L4-L5 level, Hepatic steatosis, Neck pain of over 3 months duration, Shortness of breath on exertion, and Polyarthritis involving elbow were also pertinent to this visit.  HPI MORI DUEY presents for follow up on abnormal labs suggestive of rheumatologic  Disorder as a cause for her polyarthritis.  .This visit occurred during the SARS-CoV-2 public health emergency.  Safety protocols were in place, including screening questions prior to the visit, additional usage of staff PPE, and extensive cleaning of exam room while observing appropriate contact time as indicated for disinfecting solutions.     Patient is undergoing further workup for polyarthritis .  In the interim she has developed low back pai n.  Lower Back has been in spasm for nearly a week after moving a plant.  She reports pain that radiates to left buttock and posterior tight that is not relieved by tizanidine.   She reports chronic insomnia due to "excruciating" pain ,  Constant Pins and needles  Feeling in arms and leg, and Headaches occurring on average every other day,  Usually start around 2 am.  acc'd by neck pain.  Has tried different pillows for neck support . Was taking topomax as a preventive medication  for years until she couldn't afford it .  Doesn't feel that the topomax reduced the frequency but prior PCP continued to prescribe it.  Thinks fioricet may have helped  Elevated liver enzymes:  Fatty liver noted on [place]. , no masses     Outpatient Medications Prior to Visit  Medication Sig Dispense Refill  . ibuprofen (ADVIL,MOTRIN) 200 MG tablet Take 600 mg by mouth every 6 (six) hours as needed for moderate pain.    Marland Kitchen tiZANidine (ZANAFLEX) 2 MG tablet Take 2 mg by mouth 3 (three) times daily.      No facility-administered medications prior to visit.    Review of Systems;  Patient denies headache, fevers, malaise, unintentional weight loss, skin rash, eye pain, sinus congestion and sinus pain, sore throat, dysphagia,  hemoptysis , cough, dyspnea, wheezing, chest pain, palpitations, orthopnea, edema, abdominal pain, nausea, melena, diarrhea, constipation, flank pain, dysuria, hematuria, urinary  Frequency, nocturia, numbness, tingling, seizures,  Focal weakness, Loss of consciousness,  Tremor, insomnia, depression, anxiety, and suicidal ideation.      Objective:  BP 120/82 (BP Location: Left Arm, Patient Position: Sitting, Cuff Size: Large)   Pulse (!) 105   Temp 97.9 F (36.6 C) (Temporal)   Resp 15   Ht 5\' 7"  (1.702 m)   Wt 239 lb 9.6 oz (108.7 kg)   SpO2 98%   BMI 37.53 kg/m   BP Readings from Last 3 Encounters:  04/03/20 120/82  03/05/20 124/84  12/29/19 130/82    Wt Readings from Last 3 Encounters:  04/03/20 239 lb 9.6 oz (108.7 kg)  03/05/20 242 lb 3.2 oz (109.9 kg)  12/29/19 241 lb (109.3 kg)    General appearance: alert, cooperative and appears stated age Ears: normal TM's and external ear canals both ears Throat: lips, mucosa, and tongue normal; teeth and gums normal Neck: no adenopathy, no carotid bruit, supple, symmetrical, trachea midline and thyroid not enlarged, symmetric, no tenderness/mass/nodules Back: symmetric, no curvature. ROM normal. No CVA tenderness. Lungs: clear to auscultation bilaterally Heart: regular rate and rhythm, S1, S2 normal,  no murmur, click, rub or gallop Abdomen: soft, non-tender; bowel sounds normal; no masses,  no organomegaly Pulses: 2+ and symmetric Skin: Skin color, texture, turgor normal. No rashes or lesions Lymph nodes: Cervical, supraclavicular, and axillary nodes normal.  Lab Results  Component Value Date   HGBA1C 5.9 03/05/2020    Lab Results  Component Value Date   CREATININE 0.87 03/05/2020   CREATININE  0.76 04/12/2018    Lab Results  Component Value Date   WBC 8.3 03/05/2020   HGB 14.9 03/05/2020   HCT 44.1 03/05/2020   PLT 255.0 03/05/2020   GLUCOSE 97 03/05/2020   ALT 50 (H) 03/05/2020   AST 32 03/05/2020   NA 138 03/05/2020   K 4.6 03/05/2020   CL 102 03/05/2020   CREATININE 0.87 03/05/2020   BUN 12 03/05/2020   CO2 21 03/05/2020   TSH 2.17 03/05/2020   HGBA1C 5.9 03/05/2020    US Abdomen Limited RUQ  Result Date: 03/13/2020 CLINICAL DATA:  Elevated LFTs EXAM: ULTRASOUND ABDOMEN LIMITED RIGHT UPPER QUADRANT COMPARISON:  02/05/2018 FINDINGS: Gallbladder: Surgically absent Common bile duct: Diameter: 4 mm, normal Liver: Echogenic parenchyma, likely fatty infiltration though this can be seen with cirrhosis and certain infiltrative disorders. No focal hepatic mass or nodularity grossly identified though assessment of intrahepatic detail is limited by sound attenuation. Portal vein is patent on color Doppler imaging with normal direction of blood flow towards the liver. Other: No RIGHT upper quadrant free fluid. IMPRESSION: Post cholecystectomy. Probable fatty infiltration of liver as above. No definite hepatic sonographic abnormalities though intrahepatic assessment is limited by sound attenuation; if further hepatic assessment is required, recommend MR or CT. Electronically Signed   By: Lavonia Dana M.D.   On: 03/13/2020 10:08    Assessment & Plan:   Problem List Items Addressed This Visit      Unprioritized   Back pain at L4-L5 level    Acute , present for 1 week secondary to lifting incident.  Radiating to buttock/thigh. Adding prednisone and tramadol to tizanidine      Relevant Medications   tiZANidine (ZANAFLEX) 2 MG tablet   predniSONE (DELTASONE) 10 MG tablet   traMADol (ULTRAM) 50 MG tablet   Hepatic steatosis    Suggested by [place].  etiology unclear .   Weight gain vs autoimmune  .  Advised to avoid tylenol and motrin,  And alcohol.        Neck pain of over 3 months  duration    With recurrent occipital headaches and radiculopathy to both arms. Neurology referral for EMG / studies prior to MRI cervical spine       Relevant Medications   tiZANidine (ZANAFLEX) 2 MG tablet   predniSONE (DELTASONE) 10 MG tablet   traMADol (ULTRAM) 50 MG tablet   Neuropathy   Relevant Orders   Ambulatory referral to Neurology   Polyarthritis involving elbow    Rheumatologic workup underway with positive ANA       Relevant Medications   tiZANidine (ZANAFLEX) 2 MG tablet   predniSONE (DELTASONE) 10 MG tablet   traMADol (ULTRAM) 50 MG tablet   Shortness of breath on exertion    Likely secondary to weight gain.  Vital signs are stable        Other Visit Diagnoses    Elevated ALT measurement    -  Primary   Relevant Orders   Hepatic function panel   Recurrent occipital headache       Relevant Medications   tiZANidine (ZANAFLEX)  2 MG tablet   traMADol (ULTRAM) 50 MG tablet   Other Relevant Orders   Ambulatory referral to Neurology      I am having Latrica A. Guttman start on predniSONE and traMADol. I am also having her maintain her ibuprofen and tiZANidine.  Meds ordered this encounter  Medications  . predniSONE (DELTASONE) 10 MG tablet    Sig: 6 tablets daily for 3 days, then reduce by 1 tablet daily until gone    Dispense:  33 tablet    Refill:  0  . traMADol (ULTRAM) 50 MG tablet    Sig: Take 1 tablet (50 mg total) by mouth every 6 (six) hours as needed for up to 28 doses for moderate pain.    Dispense:  28 tablet    Refill:  0   I provided  30 minutes of  face-to-face time during this encounter reviewing patient's current problems and past surgeries, labs and imaging studies, providing counseling on the above mentioned problems , and coordination  of care .  There are no discontinued medications.  Follow-up: Return in about 3 months (around 07/03/2020).   Crecencio Mc, MD

## 2020-04-03 NOTE — Patient Instructions (Signed)
For you back pain  Continue tizanidine for spasm  Add prednisone taper (60 mg daily x 3 days,  Then taper by 10 mg daily until gone)   Tramadol as needed  Max 4 daily x 7 days    Neurology referral back in progress   Fatty Liver Disease  Fatty liver disease occurs when too much fat has built up in your liver cells. Fatty liver disease is also called hepatic steatosis or steatohepatitis. The liver removes harmful substances from your bloodstream and produces fluids that your body needs. It also helps your body use and store energy from the food you eat. In many cases, fatty liver disease does not cause symptoms or problems. It is often diagnosed when tests are being done for other reasons. However, over time, fatty liver can cause inflammation that may lead to more serious liver problems, such as scarring of the liver (cirrhosis) and liver failure. Fatty liver is associated with insulin resistance, increased body fat, high blood pressure (hypertension), and high cholesterol. These are features of metabolic syndrome and increase your risk for stroke, diabetes, and heart disease. What are the causes? This condition may be caused by:  Drinking too much alcohol.  Poor nutrition.  Obesity.  Cushing's syndrome.  Diabetes.  High cholesterol.  Certain drugs.  Poisons.  Some viral infections.  Pregnancy. What increases the risk? You are more likely to develop this condition if you:  Abuse alcohol.  Are overweight.  Have diabetes.  Have hepatitis.  Have a high triglyceride level.  Are pregnant. What are the signs or symptoms? Fatty liver disease often does not cause symptoms. If symptoms do develop, they can include:  Fatigue.  Weakness.  Weight loss.  Confusion.  Abdominal pain.  Nausea and vomiting.  Yellowing of your skin and the white parts of your eyes (jaundice).  Itchy skin. How is this diagnosed? This condition may be diagnosed by:  A physical  exam and medical history.  Blood tests.  Imaging tests, such as an ultrasound, CT scan, or MRI.  A liver biopsy. A small sample of liver tissue is removed using a needle. The sample is then looked at under a microscope. How is this treated? Fatty liver disease is often caused by other health conditions. Treatment for fatty liver may involve medicines and lifestyle changes to manage conditions such as:  Alcoholism.  High cholesterol.  Diabetes.  Being overweight or obese. Follow these instructions at home:   Do not drink alcohol. If you have trouble quitting, ask your health care provider how to safely quit with the help of medicine or a supervised program. This is important to keep your condition from getting worse.  Eat a healthy diet as told by your health care provider. Ask your health care provider about working with a diet and nutrition specialist (dietitian) to develop an eating plan.  Exercise regularly. This can help you lose weight and control your cholesterol and diabetes. Talk to your health care provider about an exercise plan and which activities are best for you.  Take over-the-counter and prescription medicines only as told by your health care provider.  Keep all follow-up visits as told by your health care provider. This is important. Contact a health care provider if: You have trouble controlling your:  Blood sugar. This is especially important if you have diabetes.  Cholesterol.  Drinking of alcohol. Get help right away if:  You have abdominal pain.  You have jaundice.  You have nausea and vomiting.  You vomit blood or material that looks like coffee grounds.  You have stools that are black, tar-like, or bloody. Summary  Fatty liver disease develops when too much fat builds up in the cells of your liver.  Fatty liver disease often causes no symptoms or problems. However, over time, fatty liver can cause inflammation that may lead to more serious  liver problems, such as scarring of the liver (cirrhosis).  You are more likely to develop this condition if you abuse alcohol, are pregnant, are overweight, have diabetes, have hepatitis, or have high triglyceride levels.  Contact your health care provider if you have trouble controlling your weight, blood sugar, cholesterol, or drinking of alcohol. This information is not intended to replace advice given to you by your health care provider. Make sure you discuss any questions you have with your health care provider. Document Revised: 11/06/2017 Document Reviewed: 09/02/2017 Elsevier Patient Education  2020 [employer].

## 2020-04-04 ENCOUNTER — Encounter: Payer: Self-pay | Admitting: Neurology

## 2020-04-04 LAB — HEPATIC FUNCTION PANEL
ALT: 34 U/L (ref 0–35)
AST: 30 U/L (ref 0–37)
Albumin: 4.3 g/dL (ref 3.5–5.2)
Alkaline Phosphatase: 118 U/L — ABNORMAL HIGH (ref 39–117)
Bilirubin, Direct: 0.1 mg/dL (ref 0.0–0.3)
Total Bilirubin: 0.6 mg/dL (ref 0.2–1.2)
Total Protein: 7.2 g/dL (ref 6.0–8.3)

## 2020-04-24 ENCOUNTER — Other Ambulatory Visit: Payer: Self-pay | Admitting: Internal Medicine

## 2020-04-24 MED ORDER — DULOXETINE HCL 20 MG PO CPEP: 20.0000 mg | ORAL_CAPSULE | Freq: Every day | ORAL | 2 refills | Status: DC

## 2020-06-12 ENCOUNTER — Other Ambulatory Visit: Payer: Self-pay | Admitting: Gastroenterology

## 2020-06-12 DIAGNOSIS — R1011 Right upper quadrant pain: Secondary | ICD-10-CM

## 2020-06-22 ENCOUNTER — Ambulatory Visit: Payer: 59 | Admitting: Neurology

## 2020-06-25 ENCOUNTER — Other Ambulatory Visit: Payer: Self-pay | Admitting: Gastroenterology

## 2020-06-25 DIAGNOSIS — R1011 Right upper quadrant pain: Secondary | ICD-10-CM

## 2020-06-26 ENCOUNTER — Ambulatory Visit
Admission: RE | Admit: 2020-06-26 | Discharge: 2020-06-26 | Disposition: A | Discharge status: Home / Self Care | Payer: 59 | Source: Ambulatory Visit | Attending: Gastroenterology | Admitting: Gastroenterology

## 2020-06-26 ENCOUNTER — Other Ambulatory Visit: Payer: Self-pay

## 2020-06-26 DIAGNOSIS — R1011 Right upper quadrant pain: Secondary | ICD-10-CM | POA: Diagnosis not present

## 2020-06-26 IMAGING — MR MR 3D RECON AT [facility]
1 series · 30 of 30 positions shown · IV contrast (gadavist)
Comparison: Ultrasound the abdomen [DATE]

CLINICAL DATA: RIGHT upper quadrant pain for 1-2 years,
progressive, status post cholecystectomy in [3A].

EXAM:
MRI ABDOMEN WITHOUT AND WITH CONTRAST (INCLUDING MRCP)
TECHNIQUE: Multiplanar multisequence MR imaging of the abdomen was performed
both before and after the administration of intravenous contrast.
Heavily T2-weighted images of the biliary and pancreatic ducts were
obtained, and three-dimensional MRCP images were rendered by post
processing.
CONTRAST:  10mL GADAVIST GADOBUTROL 1 MMOL/ML IV SOLN

[Series 3: T2 · coronal · 6.0mm · 1.19mm/px · 30 of 30 slices shown]
[im 1/30]
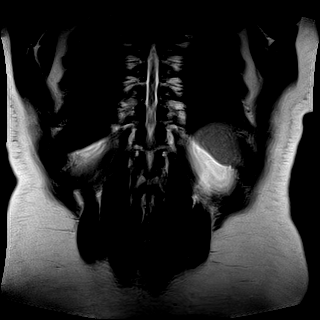
[im 2/30]
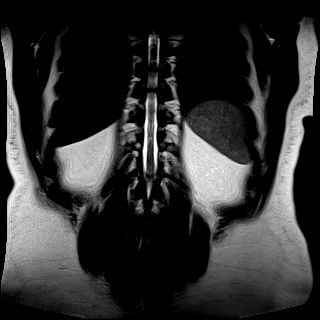
[im 3/30]
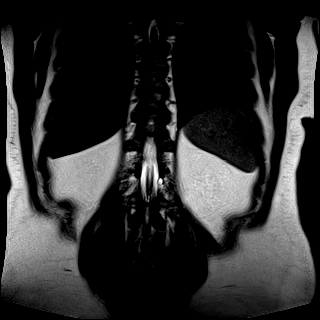
[im 4/30]
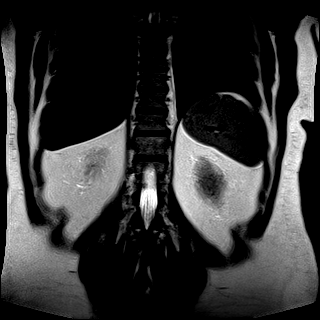
[im 5/30]
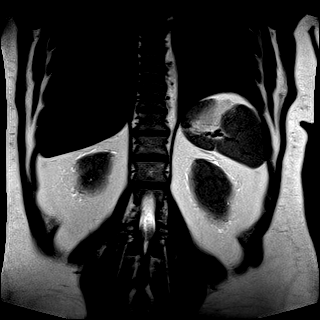
[im 6/30]
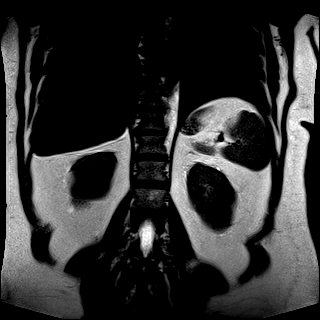
[im 7/30]
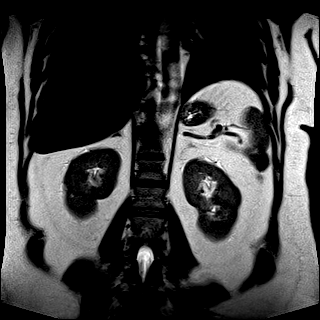
[im 8/30]
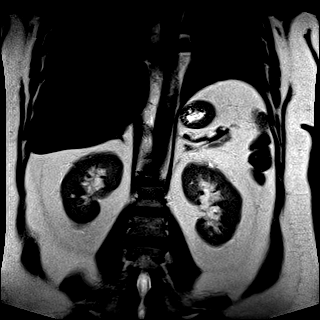
[im 9/30]
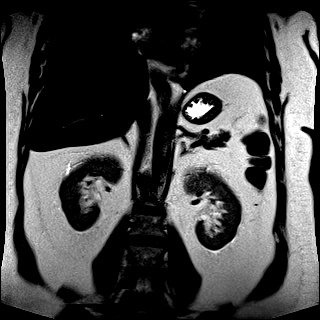
[im 10/30]
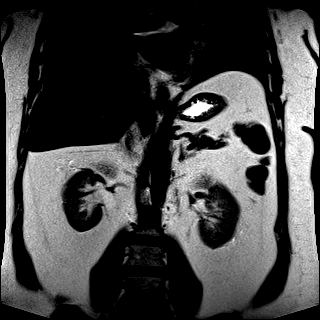
[im 11/30]
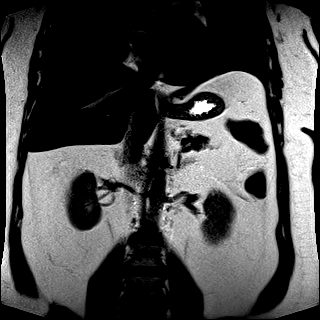
[im 12/30]
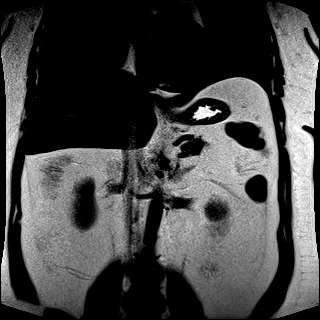
[im 13/30]
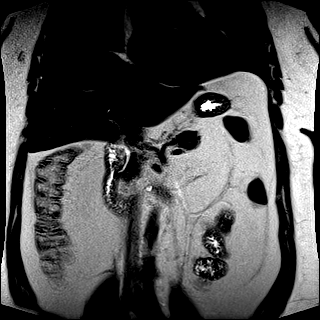
[im 14/30]
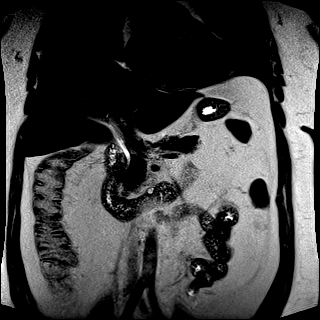
[im 15/30]
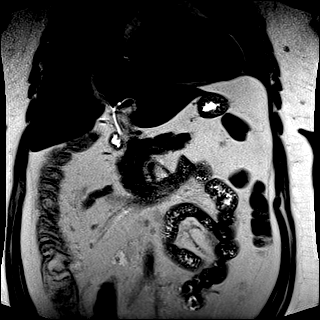
[im 16/30]
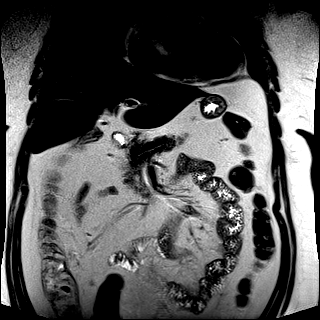
[im 17/30]
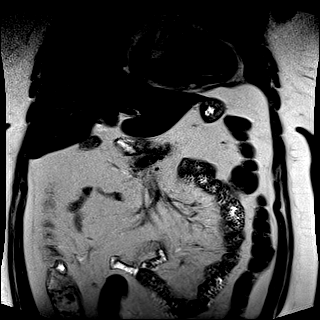
[im 18/30]
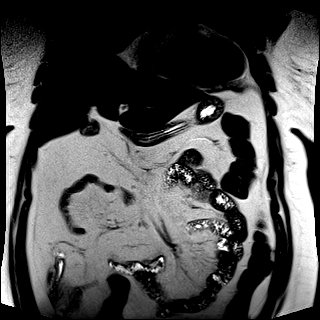
[im 19/30]
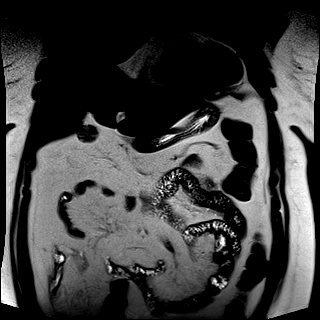
[im 20/30]
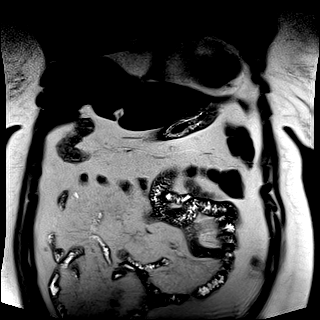
[im 21/30]
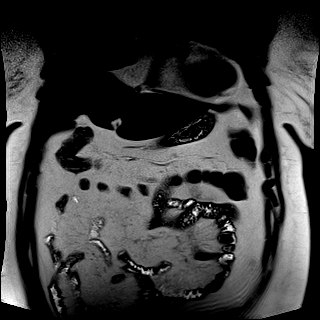
[im 22/30]
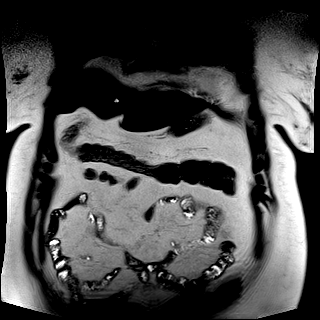
[im 23/30]
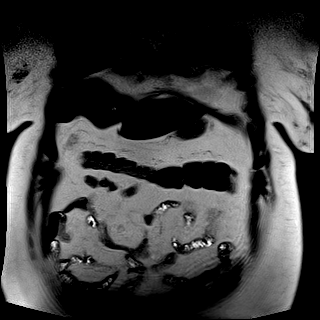
[im 24/30]
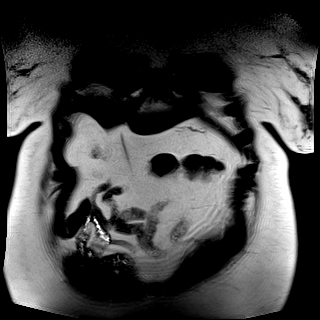
[im 25/30]
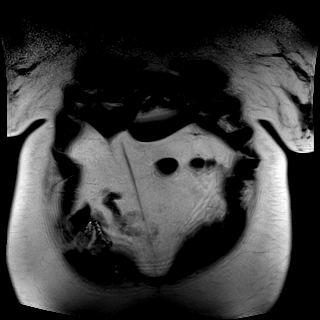
[im 26/30]
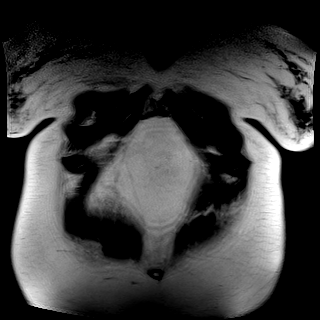
[im 27/30]
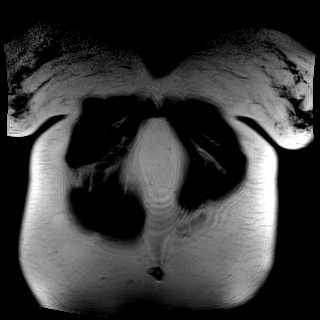
[im 28/30]
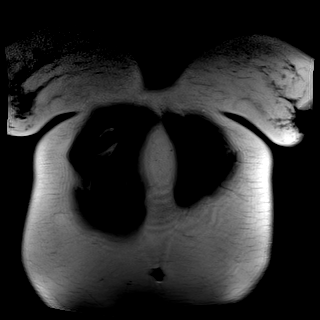
[im 29/30]
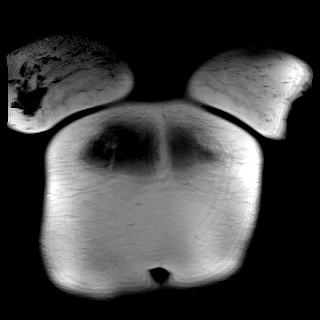
[im 30/30]
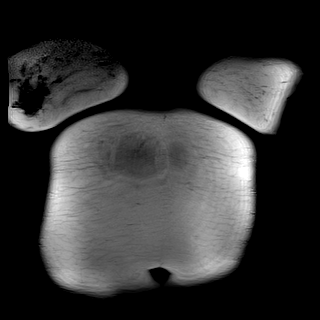

[30 of 30 positions shown; findings below may reference images not displayed]

FINDINGS: Lower chest: No consolidation. No pleural effusion. Limited
assessment of the lung bases on MRI.

Hepatobiliary: Mild hepatic steatosis. Cyst in the anterior LEFT
hemi liver. Normal post cholecystectomy appearance of the biliary
tree. Posterior division RIGHT hepatic duct drains into LEFT hepatic
duct. Normal variant biliary anatomy.

Pancreas: Pancreas is normal without ductal dilation, lesion or
peripancreatic inflammation.

Spleen:  Spleen normal size without focal lesion.

Adrenals/Urinary Tract: Adrenal glands are normal. Renal contours
are smooth. No hydronephrosis. No renal lesion.

Stomach/Bowel: Suggestion of submucosal fat deposition in the
ascending colon, incompletely evaluated. No pericolonic stranding.
Limited assessment of bowel on MRI.

No pericolonic stranding. No perienteric stranding or signs of bowel
obstruction.

Vascular/Lymphatic: Vascular structures in the abdomen are patent.
No adenopathy.

Other:  No ascites.

Musculoskeletal: Musculoskeletal structures are normal to the extent
evaluated.
IMPRESSION: 1. Normal post cholecystectomy appearance of the biliary tree. No
choledocholithiasis.
2. Mild hepatic steatosis.
3. Suggestion of submucosal fat deposition in the ascending colon,
incompletely evaluated. No pericolonic stranding or signs of bowel
obstruction. Correlate with any history of or symptoms that would
suggest colitis. No current evidence of pericolonic stranding.
4. Normal variant biliary anatomy as described.

## 2020-06-26 IMAGING — MR MR ABDOMEN WO/W CM MRCP
19 of 20 series · 45 of 48 positions shown · IV contrast (gadavist)
Comparison: Ultrasound the abdomen [DATE]

CLINICAL DATA: RIGHT upper quadrant pain for 1-2 years,
progressive, status post cholecystectomy in [3A].

EXAM:
MRI ABDOMEN WITHOUT AND WITH CONTRAST (INCLUDING MRCP)
TECHNIQUE: Multiplanar multisequence MR imaging of the abdomen was performed
both before and after the administration of intravenous contrast.
Heavily T2-weighted images of the biliary and pancreatic ducts were
obtained, and three-dimensional MRCP images were rendered by post
processing.
CONTRAST:  10mL GADAVIST GADOBUTROL 1 MMOL/ML IV SOLN

[Series 3: T2 · coronal · 6.0mm · 1.19mm/px · 2 of 30 slices shown (1 of 2)]
[im 1/30]
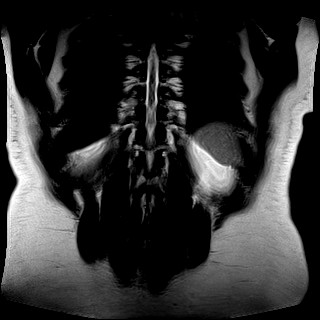
[im 30/30]
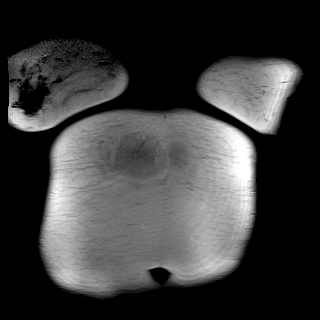

[Series 4: T2 · axial · 6.5mm · 1.19mm/px · z∈[-37,+228]mm · 2 of 35 slices shown (2 of 2)]
[im 1/35]
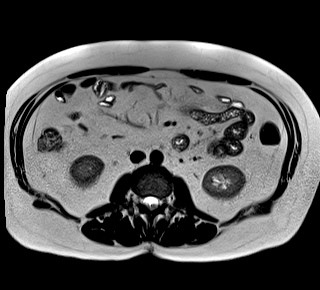
[im 35/35]
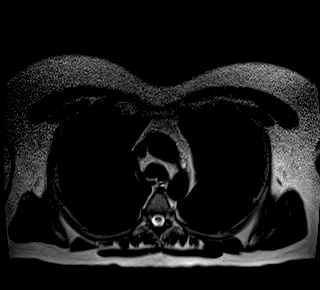

[Series 5: T1 · axial · 6.5mm · 0.74mm/px · z∈[-37,+228]mm · 2 of 35 slices shown]
[im 1/35]
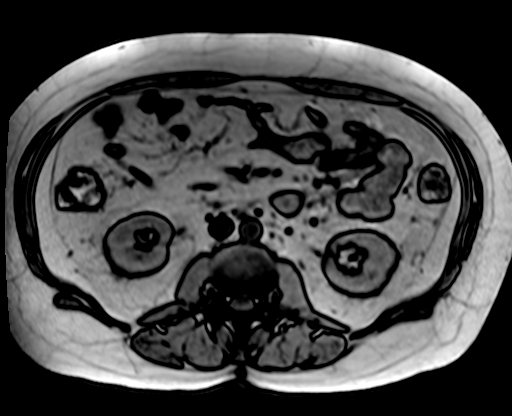
[im 35/35]
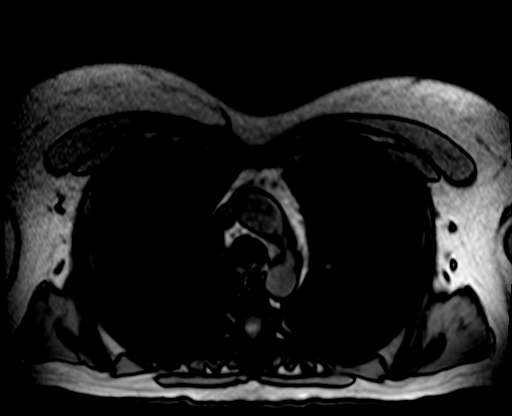

[Series 8: T2 fat-sat · axial · 6.5mm · 1.19mm/px · 1 of 35 slices shown]
[im 1/35]
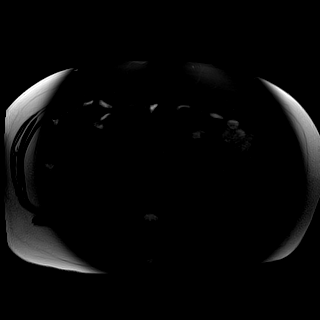

[Series 12: ax dwi_tracew · axial · 6.5mm · 1.42mm/px · z∈[-41,+232]mm · 4 of 108 slices shown]
[im 1/108]
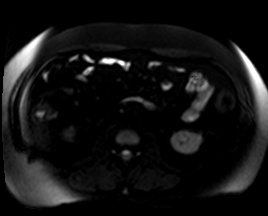
[im 36/108]
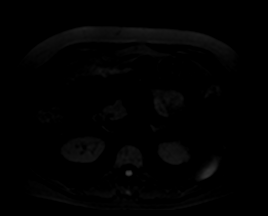
[im 72/108]
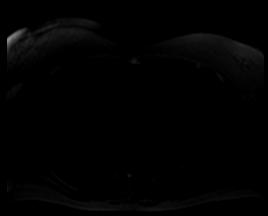
[im 108/108]
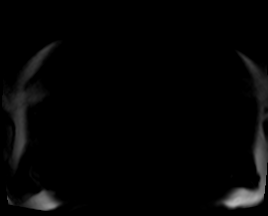

[Series 13: ax dwi_adc · axial · 6.5mm · 1.42mm/px · 1 of 36 slices shown]
[im 1/36]
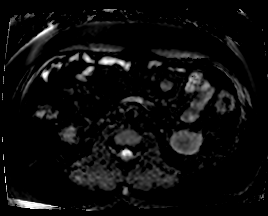

[Series 14: MRCP · coronal · 3.0mm · 1.12mm/px · 1 of 17 slices shown]
[im 1/17]
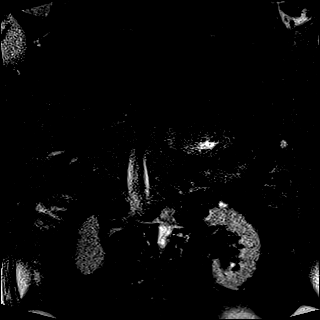

[Series 15: radials · oblique · 50.0mm · 0.78mm/px · 1 of 5 slices shown]
[im 1/5]
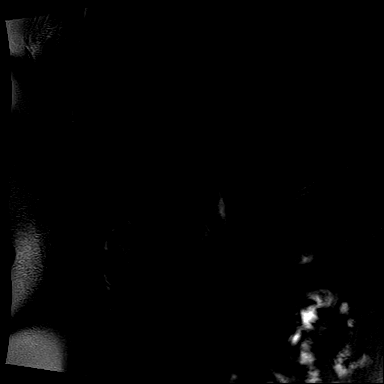

[Series 16: T1 dynamic fat-sat · axial · non-contrast · 3.0mm · 1.19mm/px · z∈[-13,+224]mm · 3 of 80 slices shown (1 of 5)]
[im 1/80]
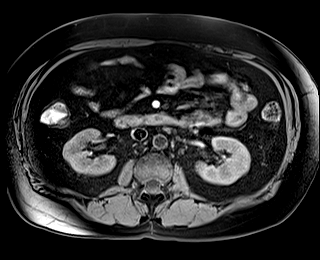
[im 40/80]
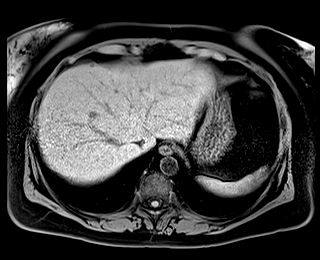
[im 80/80]
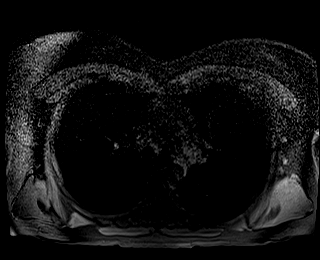

[Series 17: T1 dynamic fat-sat post-contrast · axial · 3.0mm · 1.19mm/px · z∈[-13,+224]mm · 3 of 80 slices shown (1 of 4)]
[im 1/80]
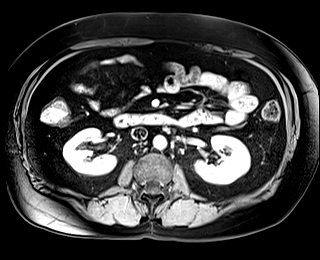
[im 40/80]
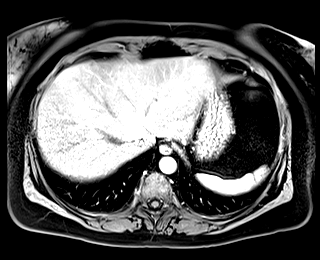
[im 80/80]
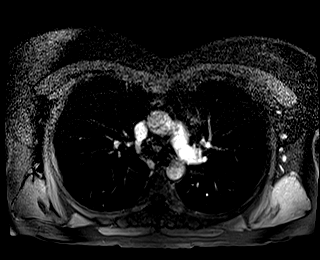

[Series 18: T1 dynamic fat-sat · axial · 3.0mm · 1.19mm/px · z∈[-13,+224]mm · 3 of 80 slices shown (2 of 5)]
[im 1/80]
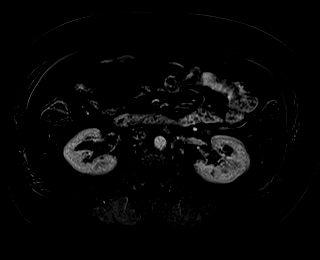
[im 40/80]
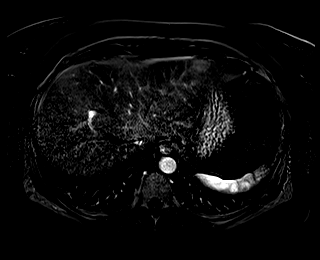
[im 80/80]
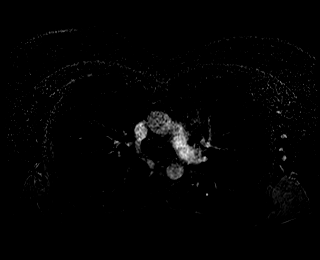

[Series 19: T1 dynamic fat-sat post-contrast · axial · 3.0mm · 1.19mm/px · z∈[-13,+224]mm · 3 of 80 slices shown (2 of 4)]
[im 1/80]
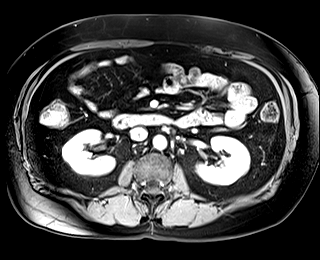
[im 40/80]
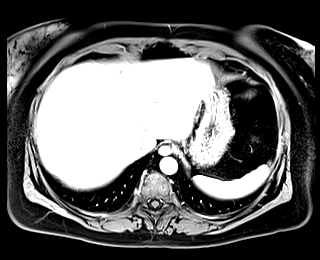
[im 80/80]
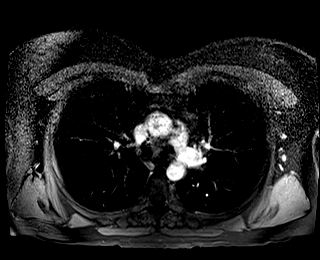

[Series 20: T1 dynamic fat-sat · axial · 3.0mm · 1.19mm/px · z∈[-13,+224]mm · 3 of 80 slices shown (3 of 5)]
[im 1/80]
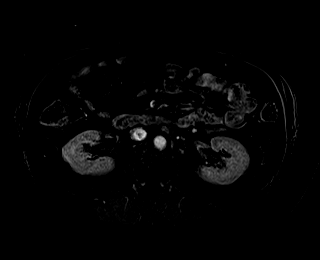
[im 40/80]
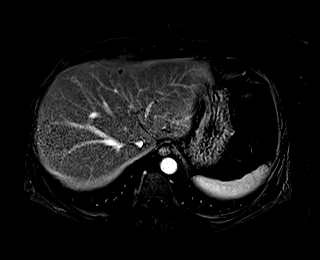
[im 80/80]
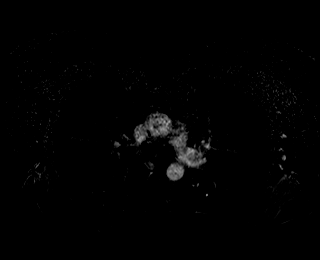

[Series 21: T1 dynamic fat-sat post-contrast · axial · 3.0mm · 1.19mm/px · z∈[-13,+224]mm · 3 of 80 slices shown (3 of 4)]
[im 1/80]
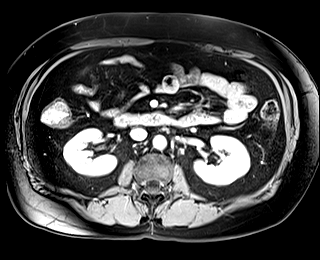
[im 40/80]
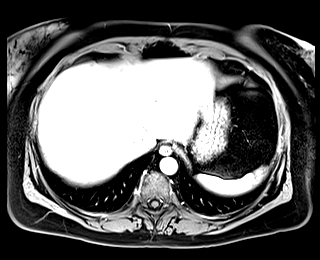
[im 80/80]
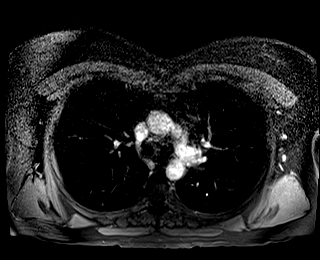

[Series 22: T1 dynamic fat-sat · axial · 3.0mm · 1.19mm/px · z∈[-13,+224]mm · 3 of 80 slices shown (4 of 5)]
[im 1/80]
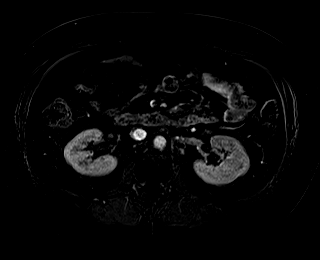
[im 40/80]
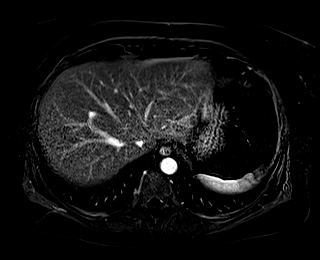
[im 80/80]
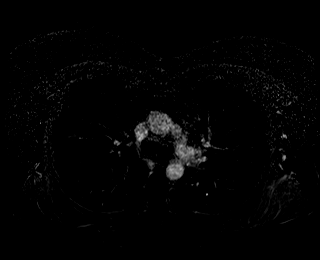

[Series 23: T1 dynamic post-contrast · coronal · 3.0mm · 1.31mm/px · 3 of 72 slices shown]
[im 1/72]
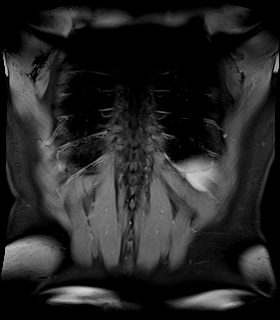
[im 36/72]
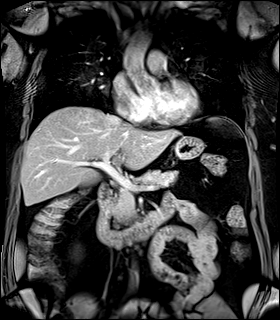
[im 72/72]
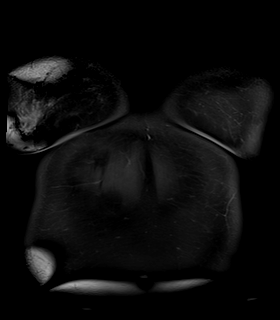

[Series 24: T1 dynamic fat-sat post-contrast · axial · 3.0mm · 1.19mm/px · z∈[-13,+224]mm · 3 of 80 slices shown (4 of 4)]
[im 1/80]
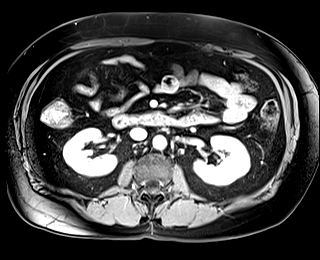
[im 40/80]
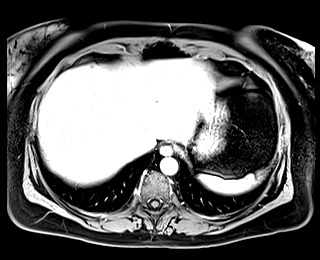
[im 80/80]
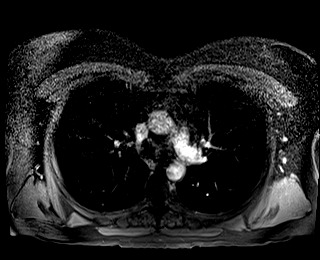

[Series 25: T1 dynamic fat-sat · axial · 3.0mm · 1.19mm/px · z∈[-13,+224]mm · 3 of 80 slices shown (5 of 5)]
[im 1/80]
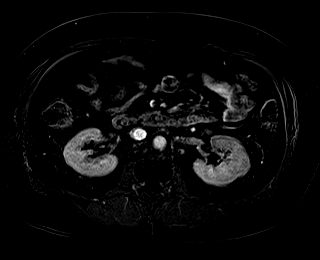
[im 40/80]
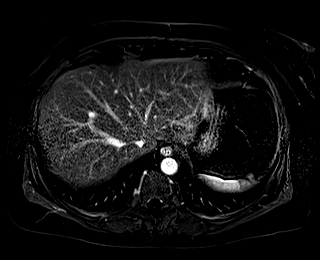
[im 80/80]
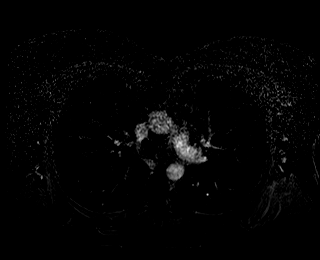

[Series 1013: in phase · axial · 6.5mm · 0.74mm/px · 1 of 35 slices shown]
[im 1/35]
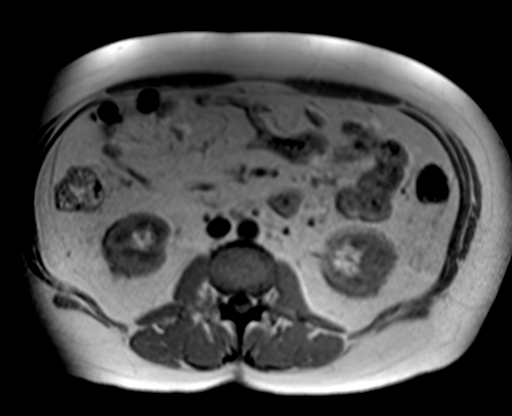

[45 of 48 positions shown; findings below may reference images not displayed]

FINDINGS: Lower chest: No consolidation. No pleural effusion. Limited
assessment of the lung bases on MRI.

Hepatobiliary: Mild hepatic steatosis. Cyst in the anterior LEFT
hemi liver. Normal post cholecystectomy appearance of the biliary
tree. Posterior division RIGHT hepatic duct drains into LEFT hepatic
duct. Normal variant biliary anatomy.

Pancreas: Pancreas is normal without ductal dilation, lesion or
peripancreatic inflammation.

Spleen:  Spleen normal size without focal lesion.

Adrenals/Urinary Tract: Adrenal glands are normal. Renal contours
are smooth. No hydronephrosis. No renal lesion.

Stomach/Bowel: Suggestion of submucosal fat deposition in the
ascending colon, incompletely evaluated. No pericolonic stranding.
Limited assessment of bowel on MRI.

No pericolonic stranding. No perienteric stranding or signs of bowel
obstruction.

Vascular/Lymphatic: Vascular structures in the abdomen are patent.
No adenopathy.

Other:  No ascites.

Musculoskeletal: Musculoskeletal structures are normal to the extent
evaluated.
IMPRESSION: 1. Normal post cholecystectomy appearance of the biliary tree. No
choledocholithiasis.
2. Mild hepatic steatosis.
3. Suggestion of submucosal fat deposition in the ascending colon,
incompletely evaluated. No pericolonic stranding or signs of bowel
obstruction. Correlate with any history of or symptoms that would
suggest colitis. No current evidence of pericolonic stranding.
4. Normal variant biliary anatomy as described.

## 2020-06-26 MED ORDER — GADOBUTROL 1 MMOL/ML IV SOLN
10.0000 mL | Freq: Once | INTRAVENOUS | Status: AC | PRN
  Administered 2020-06-26: 10 mL via INTRAVENOUS

## 2020-06-29 ENCOUNTER — Other Ambulatory Visit
Admission: RE | Admit: 2020-06-29 | Discharge: 2020-06-29 | Disposition: A | Discharge status: Home / Self Care | Payer: 59 | Source: Ambulatory Visit | Attending: Gastroenterology | Admitting: Gastroenterology

## 2020-06-29 ENCOUNTER — Other Ambulatory Visit: Payer: Self-pay

## 2020-06-29 DIAGNOSIS — Z01812 Encounter for preprocedural laboratory examination: Secondary | ICD-10-CM | POA: Insufficient documentation

## 2020-06-29 DIAGNOSIS — Z20822 Contact with and (suspected) exposure to covid-19: Secondary | ICD-10-CM | POA: Insufficient documentation

## 2020-06-29 LAB — SARS CORONAVIRUS 2 (TAT 6-24 HRS): SARS Coronavirus 2: NEGATIVE

## 2020-07-03 ENCOUNTER — Ambulatory Visit: Payer: 59 | Admitting: Anesthesiology

## 2020-07-03 ENCOUNTER — Ambulatory Visit
Admission: RE | Admit: 2020-07-03 | Discharge: 2020-07-03 | Disposition: A | Discharge status: Home / Self Care | Payer: 59 | Source: Ambulatory Visit | Attending: Gastroenterology | Admitting: Gastroenterology

## 2020-07-03 ENCOUNTER — Encounter
Admission: RE | Disposition: A | Discharge status: Home / Self Care | Payer: Self-pay | Source: Ambulatory Visit | Attending: Gastroenterology

## 2020-07-03 ENCOUNTER — Other Ambulatory Visit: Payer: Self-pay

## 2020-07-03 DIAGNOSIS — Z888 Allergy status to other drugs, medicaments and biological substances status: Secondary | ICD-10-CM | POA: Diagnosis not present

## 2020-07-03 DIAGNOSIS — Z9049 Acquired absence of other specified parts of digestive tract: Secondary | ICD-10-CM | POA: Insufficient documentation

## 2020-07-03 DIAGNOSIS — Z880 Allergy status to penicillin: Secondary | ICD-10-CM | POA: Diagnosis not present

## 2020-07-03 DIAGNOSIS — F410 Panic disorder [episodic paroxysmal anxiety] without agoraphobia: Secondary | ICD-10-CM | POA: Insufficient documentation

## 2020-07-03 DIAGNOSIS — Z881 Allergy status to other antibiotic agents status: Secondary | ICD-10-CM | POA: Diagnosis not present

## 2020-07-03 DIAGNOSIS — K2950 Unspecified chronic gastritis without bleeding: Secondary | ICD-10-CM | POA: Insufficient documentation

## 2020-07-03 DIAGNOSIS — R1011 Right upper quadrant pain: Secondary | ICD-10-CM | POA: Insufficient documentation

## 2020-07-03 DIAGNOSIS — K317 Polyp of stomach and duodenum: Secondary | ICD-10-CM | POA: Diagnosis not present

## 2020-07-03 DIAGNOSIS — G43909 Migraine, unspecified, not intractable, without status migrainosus: Secondary | ICD-10-CM | POA: Diagnosis not present

## 2020-07-03 DIAGNOSIS — K2270 Barrett's esophagus without dysplasia: Secondary | ICD-10-CM | POA: Diagnosis not present

## 2020-07-03 DIAGNOSIS — Z79899 Other long term (current) drug therapy: Secondary | ICD-10-CM | POA: Diagnosis not present

## 2020-07-03 HISTORY — PX: ESOPHAGOGASTRODUODENOSCOPY (EGD) WITH PROPOFOL: SHX5813

## 2020-07-03 SURGERY — ESOPHAGOGASTRODUODENOSCOPY (EGD) WITH PROPOFOL
Anesthesia: General

## 2020-07-03 MED ORDER — SODIUM CHLORIDE 0.9 % IV SOLN: INTRAVENOUS | Status: DC

## 2020-07-03 MED ORDER — BUTAMBEN-TETRACAINE-BENZOCAINE 2-2-14 % EX AERO
INHALATION_SPRAY | CUTANEOUS | Status: AC
  Filled 2020-07-03: qty 5

## 2020-07-03 MED ORDER — PROPOFOL 500 MG/50ML IV EMUL
INTRAVENOUS | Status: DC | PRN
  Administered 2020-07-03: 150 ug/kg/min via INTRAVENOUS

## 2020-07-03 MED ORDER — LIDOCAINE HCL (CARDIAC) PF 100 MG/5ML IV SOSY
PREFILLED_SYRINGE | INTRAVENOUS | Status: DC | PRN
  Administered 2020-07-03: 50 mg via INTRAVENOUS

## 2020-07-03 MED ORDER — LIDOCAINE HCL (PF) 2 % IJ SOLN
INTRAMUSCULAR | Status: AC
  Filled 2020-07-03: qty 5

## 2020-07-03 MED ORDER — BUTAMBEN-TETRACAINE-BENZOCAINE 2-2-14 % EX AERO
INHALATION_SPRAY | CUTANEOUS | Status: DC | PRN
  Administered 2020-07-03: 2 via TOPICAL

## 2020-07-03 MED ORDER — PROPOFOL 500 MG/50ML IV EMUL
INTRAVENOUS | Status: AC
  Filled 2020-07-03: qty 50

## 2020-07-03 NOTE — Op Note (Signed)
[facility] Patient Name: Yolanda Young Procedure Date: 07/03/2020 7:10 AM MRN: [id number] Account #: [id number] Date of Birth: Feb 05, 1966 Admit Type: Outpatient Age: 54 Room: [facility] ENDO ROOM 3 Gender: Female Note Status: Finalized Procedure:             Upper GI endoscopy Indications:           Abdominal pain in the right upper quadrant, Dyspepsia Providers:             Andrey Farmer MD, MD Medicines:             Monitored Anesthesia Care Complications:         No immediate complications. Estimated blood loss:                         Minimal. Procedure:             Pre-Anesthesia Assessment:                        - Prior to the procedure, a History and Physical was                         performed, and patient medications and allergies were                         reviewed. The patient is competent. The risks and                         benefits of the procedure and the sedation options and                         risks were discussed with the patient. All questions                         were answered and informed consent was obtained.                         Patient identification and proposed procedure were                         verified by the physician, the nurse, the anesthetist                         and the technician in the endoscopy suite. Mental                         Status Examination: alert and oriented. Airway                         Examination: normal oropharyngeal airway and neck                         mobility. Respiratory Examination: clear to                         auscultation. CV Examination: normal. Prophylactic                         Antibiotics: The patient does not require prophylactic  antibiotics. Prior Anticoagulants: The patient has                         taken no previous anticoagulant or antiplatelet                         agents. ASA Grade Assessment: II - A patient with mild                          systemic disease. After reviewing the risks and                         benefits, the patient was deemed in satisfactory                         condition to undergo the procedure. The anesthesia                         plan was to use monitored anesthesia care (MAC).                         Immediately prior to administration of medications,                         the patient was re-assessed for adequacy to receive                         sedatives. The heart rate, respiratory rate, oxygen                         saturations, blood pressure, adequacy of pulmonary                         ventilation, and response to care were monitored                         throughout the procedure. The physical status of the                         patient was re-assessed after the procedure.                        After obtaining informed consent, the endoscope was                         passed under direct vision. Throughout the procedure,                         the patient's blood pressure, pulse, and oxygen                         saturations were monitored continuously. The Endoscope                         was introduced through the mouth, and advanced to the                         second part of duodenum. The upper GI endoscopy was  accomplished without difficulty. The patient tolerated                         the procedure well. Findings:      One tongue of salmon-colored mucosa was present. The maximum       longitudinal extent of these esophageal mucosal changes was 1 cm in       length. Mucosa was biopsied with a cold forceps for histology in 4       quadrants at intervals of 2 cm in the lower third of the esophagus. One       specimen bottle was sent to pathology. Estimated blood loss was minimal.      The exam of the esophagus was otherwise normal.      Striped mildly erythematous mucosa without bleeding was found in the       gastric antrum.  Biopsies were taken with a cold forceps for Helicobacter       pylori testing. Estimated blood loss was minimal.      A single 2 mm sessile polyp with no stigmata of recent bleeding was       found in the gastric fundus. The polyp was removed with a cold biopsy       forceps. Resection and retrieval were complete. Estimated blood loss was       minimal.      The examined duodenum was normal. Impression:            - Salmon-colored mucosa classified as Barrett's stage                         C0-M1 per [place] criteria. Biopsied.                        - Erythematous mucosa in the antrum. Biopsied.                        - A single gastric polyp. Resected and retrieved.                        - Normal examined duodenum. Recommendation:        - Discharge patient to home.                        - Resume previous diet.                        - Continue present medications.                        - Await pathology results.                        - Return to referring physician as previously                         scheduled. Procedure Code(s):     --- Professional ---                        [phone], Esophagogastroduodenoscopy, flexible,                         transoral; with biopsy, single or multiple Diagnosis  Code(s):     --- Professional ---                        K22.70, Barrett's esophagus without dysplasia                        K31.89, Other diseases of stomach and duodenum                        K31.7, Polyp of stomach and duodenum                        R10.11, Right upper quadrant pain                        R10.13, Epigastric pain CPT copyright 2019 American Medical Association. All rights reserved. The codes documented in this report are preliminary and upon coder review may  be revised to meet current compliance requirements. Andrey Farmer, MD Andrey Farmer MD, MD 07/03/2020 9:00:08 AM Number of Addenda: 0 Note Initiated On: 07/03/2020 7:10 AM Estimated Blood Loss:   Estimated blood loss was minimal.      [facility]

## 2020-07-03 NOTE — Interval H&P Note (Signed)
History and Physical Interval Note:  07/03/2020 8:39 AM  Yolanda Young  has presented today for surgery, with the diagnosis of RUQ DYSPEPSIA.  The various methods of treatment have been discussed with the patient and family. After consideration of risks, benefits and other options for treatment, the patient has consented to  Procedure(s): ESOPHAGOGASTRODUODENOSCOPY (EGD) WITH PROPOFOL (N/A) as a surgical intervention.  The patient's history has been reviewed, patient examined, no change in status, stable for surgery.  I have reviewed the patient's chart and labs.  Questions were answered to the patient's satisfaction.     Lesly Rubenstein  Ok to proceed with EGD.

## 2020-07-03 NOTE — H&P (Signed)
Outpatient short stay form Pre-procedure 07/03/2020 8:37 AM Raylene Miyamoto MD, MPH  Primary Physician: Dr. Derrel Nip  Reason for visit:  Dyspepsia  History of present illness:   54 y/o lady with dyspepsia. No anticoagulants. Hx of cholecystectomy. Father with crohns. No dysphagia.   Current Facility-Administered Medications:  .  0.9 %  sodium chloride infusion, , Intravenous, Continuous, Rachelann Enloe, Hilton Cork, MD, Last Rate: 20 mL/hr at 07/03/20 0831, Continued from Pre-op at 07/03/20 0831 .  butamben-tetracaine-benzocaine (CETACAINE) 01-09-13 % spray, , , ,   Medications Prior to Admission  Medication Sig Dispense Refill Last Dose  . DULoxetine (CYMBALTA) 20 MG capsule Take 1 capsule (20 mg total) by mouth daily. 30 capsule 2   . ibuprofen (ADVIL,MOTRIN) 200 MG tablet Take 600 mg by mouth every 6 (six) hours as needed for moderate pain.     . predniSONE (DELTASONE) 10 MG tablet 6 tablets daily for 3 days, then reduce by 1 tablet daily until gone 33 tablet 0   . tiZANidine (ZANAFLEX) 2 MG tablet Take 2 mg by mouth 3 (three) times daily.     . traMADol (ULTRAM) 50 MG tablet Take 1 tablet (50 mg total) by mouth every 6 (six) hours as needed for up to 28 doses for moderate pain. 28 tablet 0      Allergies  Allergen Reactions  . Azithromycin Rash  . Erythromycin Rash  . Penicillins Rash    Has patient had a PCN reaction causing immediate rash, facial/tongue/throat swelling, SOB or lightheadedness with hypotension: No Has patient had a PCN reaction causing severe rash involving mucus membranes or skin necrosis: Yes Has patient had a PCN reaction that required hospitalization: No Has patient had a PCN reaction occurring within the last 10 years: No If all of the above answers are "NO", then may proceed with Cephalosporin use.   . Prednisone Nausea And Vomiting     Past Medical History:  Diagnosis Date  . Colon polyp 02/04/2018   tubular adenoma  . GERD (gastroesophageal reflux disease)    . History of hiatal hernia   . Migraine    weekly  . Panic attacks     Review of systems:  Otherwise negative.    Physical Exam  Gen: Alert, oriented. Appears stated age.  HEENT: [place]/AT. PERRLA. Lungs: No respiratory distress Abd: soft, benign, no masses. BS+ Ext: No edema. Pulses 2+    Planned procedures: Proceed with EGD. The patient understands the nature of the planned procedure, indications, risks, alternatives and potential complications including but not limited to bleeding, infection, perforation, damage to internal organs and possible oversedation/side effects from anesthesia. The patient agrees and gives consent to proceed.  Please refer to procedure notes for findings, recommendations and patient disposition/instructions.     Raylene Miyamoto MD, MPH Gastroenterology 07/03/2020  8:37 AM

## 2020-07-03 NOTE — Anesthesia Preprocedure Evaluation (Signed)
Anesthesia Evaluation  Patient identified by MRN, date of birth, ID band Patient awake    Reviewed: Allergy & Precautions, NPO status , Patient's Chart, lab work & pertinent test results  History of Anesthesia Complications Negative for: history of anesthetic complications  Airway Mallampati: III       Dental   Pulmonary neg sleep apnea, neg COPD, Not current smoker, former smoker,           Cardiovascular (-) hypertension(-) Past MI and (-) CHF (-) dysrhythmias (-) Valvular Problems/Murmurs     Neuro/Psych neg Seizures Anxiety    GI/Hepatic Neg liver ROS, hiatal hernia, GERD  Medicated and Controlled,  Endo/Other  neg diabetes  Renal/GU negative Renal ROS     Musculoskeletal   Abdominal   Peds  Hematology   Anesthesia Other Findings   Reproductive/Obstetrics                             Anesthesia Physical Anesthesia Plan  ASA: II  Anesthesia Plan: General   Post-op Pain Management:    Induction: Intravenous  PONV Risk Score and Plan: 3 and Propofol infusion, TIVA and Treatment may vary due to age or medical condition  Airway Management Planned: Nasal Cannula  Additional Equipment:   Intra-op Plan:   Post-operative Plan:   Informed Consent: I have reviewed the patients History and Physical, chart, labs and discussed the procedure including the risks, benefits and alternatives for the proposed anesthesia with the patient or authorized representative who has indicated his/her understanding and acceptance.       Plan Discussed with:   Anesthesia Plan Comments:         Anesthesia Quick Evaluation

## 2020-07-03 NOTE — Anesthesia Postprocedure Evaluation (Signed)
Anesthesia Post Note  Patient: Yolanda Young  Procedure(s) Performed: ESOPHAGOGASTRODUODENOSCOPY (EGD) WITH PROPOFOL (N/A )  Patient location during evaluation: Endoscopy Anesthesia Type: General Level of consciousness: awake and alert Pain management: pain level controlled Vital Signs Assessment: post-procedure vital signs reviewed and stable Respiratory status: spontaneous breathing and respiratory function stable Cardiovascular status: stable Anesthetic complications: no   No complications documented.   Last Vitals:  Vitals:   07/03/20 0858 07/03/20 0908  BP: 126/81   Pulse:    Resp:    Temp: (!) 36.3 C   SpO2:  97%    Last Pain:  Vitals:   07/03/20 0918  TempSrc:   PainSc: 0-No pain                 Lilou Kneip K

## 2020-07-03 NOTE — Transfer of Care (Signed)
Immediate Anesthesia Transfer of Care Note  Patient: Yolanda Young  Procedure(s) Performed: ESOPHAGOGASTRODUODENOSCOPY (EGD) WITH PROPOFOL (N/A )  Patient Location: PACU  Anesthesia Type:General  Level of Consciousness: awake and sedated  Airway & Oxygen Therapy: Patient Spontanous Breathing and Patient connected to nasal cannula oxygen  Post-op Assessment: Report given to RN and Post -op Vital signs reviewed and stable  Post vital signs: Reviewed and stable  Last Vitals:  Vitals Value Taken Time  BP    Temp    Pulse    Resp    SpO2      Last Pain:  Vitals:   07/03/20 0749  TempSrc: Temporal  PainSc: 4          Complications: No complications documented.

## 2020-07-03 NOTE — Anesthesia Procedure Notes (Signed)
Performed by: Cook-Martin, Zarianna Dicarlo Pre-anesthesia Checklist: Patient identified, Emergency Drugs available, Suction available, Patient being monitored and Timeout performed Patient Re-evaluated:Patient Re-evaluated prior to induction Oxygen Delivery Method: Nasal cannula Preoxygenation: Pre-oxygenation with 100% oxygen Induction Type: IV induction Airway Equipment and Method: Bite block Placement Confirmation: positive ETCO2 and CO2 detector       

## 2020-07-04 ENCOUNTER — Encounter: Payer: Self-pay | Admitting: Gastroenterology

## 2020-07-04 ENCOUNTER — Ambulatory Visit: Payer: 59 | Admitting: Internal Medicine

## 2020-07-04 VITALS — BP 96/72 | HR 93 | Temp 98.2°F | Resp 15 | Ht 68.0 in | Wt 226.6 lb

## 2020-07-04 DIAGNOSIS — Z1231 Encounter for screening mammogram for malignant neoplasm of breast: Secondary | ICD-10-CM | POA: Diagnosis not present

## 2020-07-04 DIAGNOSIS — G629 Polyneuropathy, unspecified: Secondary | ICD-10-CM

## 2020-07-04 DIAGNOSIS — M545 Low back pain, unspecified: Secondary | ICD-10-CM

## 2020-07-04 DIAGNOSIS — K2950 Unspecified chronic gastritis without bleeding: Secondary | ICD-10-CM | POA: Diagnosis not present

## 2020-07-04 DIAGNOSIS — K760 Fatty (change of) liver, not elsewhere classified: Secondary | ICD-10-CM

## 2020-07-04 DIAGNOSIS — K589 Irritable bowel syndrome without diarrhea: Secondary | ICD-10-CM

## 2020-07-04 DIAGNOSIS — M130 Polyarthritis, unspecified: Secondary | ICD-10-CM

## 2020-07-04 DIAGNOSIS — R0989 Other specified symptoms and signs involving the circulatory and respiratory systems: Secondary | ICD-10-CM

## 2020-07-04 DIAGNOSIS — K2270 Barrett's esophagus without dysplasia: Secondary | ICD-10-CM

## 2020-07-04 LAB — SURGICAL PATHOLOGY

## 2020-07-04 MED ORDER — AMITRIPTYLINE HCL 25 MG PO TABS: 25.0000 mg | ORAL_TABLET | Freq: Every day | ORAL | 5 refills | Status: DC

## 2020-07-04 MED ORDER — TRAMADOL HCL 50 MG PO TABS: 50.0000 mg | ORAL_TABLET | Freq: Two times a day (BID) | ORAL | 5 refills | Status: DC | PRN

## 2020-07-04 NOTE — Patient Instructions (Addendum)
Starting amitriptyline for your chronic pain due to fibromyalgia.  Take  25 mg daily in the evening after dinner or at bedtime.  Continue cymbalta for  The first 5 days while you are starting the amitriptyline.   After 5 days of both,  You can continue the amitriptyline  and stop the cymbalta.   The amitryptiline can be increased to 50 mg after 7-10 days.  Let me know if you do this bc you will need a prescription update   Use the tizanidine and tylenol /tramadol  For the back pain.    Avoid ibuprofen and aleve since gastritis was seen on the EGD    Diet for Irritable Bowel Syndrome When you have irritable bowel syndrome (IBS), it is very important to eat the foods and follow the eating habits that are best for your condition. IBS may cause various symptoms such as pain in the abdomen, constipation, or diarrhea. Choosing the right foods can help to ease the discomfort from these symptoms. Work with your health care provider and diet and nutrition specialist (dietitian) to find the eating plan that will help to control your symptoms. What are tips for following this plan?      Keep a food diary. This will help you identify foods that cause symptoms. Write down: ? What you eat and when you eat it. ? What symptoms you have. ? When symptoms occur in relation to your meals, such as "pain in abdomen 2 hours after dinner."  Eat your meals slowly and in a relaxed setting.  Aim to eat 5-6 small meals per day. Do not skip meals.  Drink enough fluid to keep your urine pale yellow.  Ask your health care provider if you should take an over-the-counter probiotic to help restore healthy bacteria in your gut (digestive tract). ? Probiotics are foods that contain good bacteria and yeasts.  Your dietitian may have specific dietary recommendations for you based on your symptoms. He or she may recommend that you: ? Avoid foods that cause symptoms. Talk with your dietitian about other ways to get the  same nutrients that are in those problem foods. ? Avoid foods with gluten. Gluten is a protein that is found in rye, wheat, and barley. ? Eat more foods that contain soluble fiber. Examples of foods with high soluble fiber include oats, seeds, and certain fruits and vegetables. Take a fiber supplement if directed by your dietitian. ? Reduce or avoid certain foods called FODMAPs. These are foods that contain carbohydrates that are hard to digest. Ask your doctor which foods contain these carbohydrates. What foods are not recommended? The following are some foods and drinks that may make your symptoms worse:  Fatty foods, such as french fries.  Foods that contain gluten, such as pasta and cereal.  Dairy products, such as milk, cheese, and ice cream.  Chocolate.  Alcohol.  Products with caffeine, such as coffee.  Carbonated drinks, such as soda.  Foods that are high in FODMAPs. These include certain fruits and vegetables.  Products with sweeteners such as honey, high fructose corn syrup, sorbitol, and mannitol. The items listed above may not be a complete list of foods and beverages you should avoid. Contact a dietitian for more information. What foods are good sources of fiber? Your health care provider or dietitian may recommend that you eat more foods that contain fiber. Fiber can help to reduce constipation and other IBS symptoms. Add foods with fiber to your diet a little at a time  so your body can get used to them. Too much fiber at one time might cause gas and swelling of your abdomen. The following are some foods that are good sources of fiber:  Berries, such as raspberries, strawberries, and blueberries.  Tomatoes.  Carrots.  Brown rice.  Oats.  Seeds, such as chia and pumpkin seeds. The items listed above may not be a complete list of recommended sources of fiber. Contact your dietitian for more options. Where to find more information  International Foundation for  Functional Gastrointestinal Disorders: www.iffgd.[employer] of Diabetes and Digestive and Kidney Diseases: [employer] Summary  When you have irritable bowel syndrome (IBS), it is very important to eat the foods and follow the eating habits that are best for your condition.  IBS may cause various symptoms such as pain in the abdomen, constipation, or diarrhea.  Choosing the right foods can help to ease the discomfort that comes from symptoms.  Keep a food diary. This will help you identify foods that cause symptoms.  Your health care provider or diet and nutrition specialist (dietitian) may recommend that you eat more foods that contain fiber. This information is not intended to replace advice given to you by your health care provider. Make sure you discuss any questions you have with your health care provider. Document Revised: 03/16/2019 Document Reviewed: 07/28/2017 Elsevier Patient Education  [place].

## 2020-07-04 NOTE — Progress Notes (Signed)
Subjective:  Patient ID: Yolanda Young, female    DOB: 1966/06/10  Age: 54 y.o. MRN: [id number]  CC: Diagnoses of Polyarthritis involving elbow, Neuropathy, Chronic gastritis without bleeding, unspecified gastritis type, Choking episode occurring at night, Back pain at L4-L5 level, Hepatic steatosis, Barrett's esophagus without dysplasia, and Irritable bowel syndrome, unspecified type were pertinent to this visit.  HPI Yolanda Young presents for follow up on chronic and acute pain.   This visit occurred during the SARS-CoV-2 public health emergency.  Safety protocols were in place, including screening questions prior to the visit, additional usage of staff PPE, and extensive cleaning of exam room while observing appropriate contact time as indicated for disinfecting solutions.    She has been having  Right sided back pain  For the past 2 weeks , prior to her recent fall.  The pain is parallel to her spine and is not accompanied by nausea or dysuria.  She has chronic dyspnea with exertion but not at rest and denies pleurisy     She had a fall at the grocery store on Monday, both feet went out from under her and she landed on her buttocks.  Back and hip still hurting.  No bruising noted.   Had MRI of abdomen followed by EGD which was done yesterday by dr Amaryllis Dyke at [facility].  , was referred to  GI by Rheumatology after evaluation of positive AMA .  Thus far there is no evidence of PBC.  Fatty liver was noted on recent imaging and discussed today in detail.   She notes that she develops abdominal distension whenever she eats or drinks anything.  Marland Kitchen  No prior dx of IBS or gastroparesis.  She has GI follow up on August 18. Pantoprazole prescribed bu not started  Yet by patient.  Has an appt with Neurology  In [place] ( 2nd referral made ) for the 1) numbness and tingling of both feet and 2)  the recurrent occipital headaches .      Outpatient Medications Prior to Visit  Medication Sig Dispense Refill   . ibuprofen (ADVIL,MOTRIN) 200 MG tablet Take 600 mg by mouth every 6 (six) hours as needed for moderate pain.    Marland Kitchen tiZANidine (ZANAFLEX) 2 MG tablet Take 2 mg by mouth 3 (three) times daily.    . DULoxetine (CYMBALTA) 20 MG capsule Take 1 capsule (20 mg total) by mouth daily. 30 capsule 2  . traMADol (ULTRAM) 50 MG tablet Take 1 tablet (50 mg total) by mouth every 6 (six) hours as needed for up to 28 doses for moderate pain. 28 tablet 0  . predniSONE (DELTASONE) 10 MG tablet 6 tablets daily for 3 days, then reduce by 1 tablet daily until gone (Patient not taking: Reported on 07/04/2020) 33 tablet 0   No facility-administered medications prior to visit.    Review of Systems;  Patient denies headache, fevers, malaise, unintentional weight loss, skin rash, eye pain, sinus congestion and sinus pain, sore throat, dysphagia,  hemoptysis , cough, dyspnea, wheezing, chest pain, palpitations, orthopnea, edema, abdominal pain, nausea, melena, diarrhea, constipation, flank pain, dysuria, hematuria, urinary  Frequency, nocturia, numbness, tingling, seizures,  Focal weakness, Loss of consciousness,  Tremor, insomnia, depression, anxiety, and suicidal ideation.      Objective:  BP 96/72 (BP Location: Left Arm, Patient Position: Sitting, Cuff Size: Large)   Pulse 93   Temp 98.2 F (36.8 C) (Oral)   Resp 15   Ht 5\' 8"  (1.727 m)  Wt (!) 226 lb 9.6 oz (102.8 kg)   SpO2 97%   BMI 34.45 kg/m   BP Readings from Last 3 Encounters:  07/04/20 96/72  07/03/20 126/81  04/03/20 120/82    Wt Readings from Last 3 Encounters:  07/04/20 (!) 226 lb 9.6 oz (102.8 kg)  07/03/20 (!) 230 lb (104.3 kg)  04/03/20 239 lb 9.6 oz (108.7 kg)    General appearance: alert, cooperative and appears stated age Ears: normal TM's and external ear canals both ears Throat: lips, mucosa, and tongue normal; teeth and gums normal Neck: no adenopathy, no carotid bruit, supple, symmetrical, trachea midline and thyroid not  enlarged, symmetric, no tenderness/mass/nodules Back: symmetric, no curvature. ROM normal. No CVA tenderness. Lungs: clear to auscultation bilaterally Heart: regular rate and rhythm, S1, S2 normal, no murmur, click, rub or gallop Abdomen: soft, non-tender; bowel sounds normal; no masses,  no organomegaly Pulses: 2+ and symmetric Skin: Skin color, texture, turgor normal. No rashes or lesions Lymph nodes: Cervical, supraclavicular, and axillary nodes normal.  Lab Results  Component Value Date   HGBA1C 5.9 03/05/2020    Lab Results  Component Value Date   CREATININE 0.87 03/05/2020   CREATININE 0.76 04/12/2018    Lab Results  Component Value Date   WBC 8.3 03/05/2020   HGB 14.9 03/05/2020   HCT 44.1 03/05/2020   PLT 255.0 03/05/2020   GLUCOSE 97 03/05/2020   ALT 34 04/03/2020   AST 30 04/03/2020   NA 138 03/05/2020   K 4.6 03/05/2020   CL 102 03/05/2020   CREATININE 0.87 03/05/2020   BUN 12 03/05/2020   CO2 21 03/05/2020   TSH 2.17 03/05/2020   HGBA1C 5.9 03/05/2020    No results found.  Assessment & Plan:   Problem List Items Addressed This Visit      Unprioritized   Polyarthritis involving elbow    No evidence of rheumatic disease by recent Rheumatology evaluation       Relevant Medications   traMADol (ULTRAM) 50 MG tablet   Neuropathy    Referral has been repeated and she has an appt in he near future       IBS (irritable bowel syndrome)    symptoms reported as postprandial distension.  Trial of elavil,  weaning off of cymbalta due to lack of perceived benefit to chronic pain       Hepatic steatosis    Supported by MRI findings. .   Continue lifestyle modfications with goal weight loss of 10% over the next 6 months using exercise and a low glycemic index diet.  Advised to avoid daily use of  Tylenol,  motrin,  And alcohol.   Vaccination for hepatitis A and B needed       Gastritis without bleeding    EGD done and nonbleeding antral  gastritis noted. .   Advised to start protonix for Barret's .  Staring elavil as well for symptoms suggestive of IBS       Choking episode occurring at night    Sleep study was ordered in March at first visit but deferred by patient      Barrett's esophagus without dysplasia    By July 2021 EGD (Locklear).  Start PPI      Back pain at L4-L5 level    Exam suggests muscle spasm  Aggravated by central adiposity.  .Back extension exercises demonstrated.       Relevant Medications   traMADol (ULTRAM) 50 MG tablet      I  have discontinued Yarrow A. Arzola's predniSONE and DULoxetine. I have also changed her traMADol. Additionally, I am having her start on amitriptyline. Lastly, I am having her maintain her ibuprofen and tiZANidine.  Meds ordered this encounter  Medications  . amitriptyline (ELAVIL) 25 MG tablet    Sig: Take 1 tablet (25 mg total) by mouth at bedtime.    Dispense:  30 tablet    Refill:  5  . traMADol (ULTRAM) 50 MG tablet    Sig: Take 1 tablet (50 mg total) by mouth 2 (two) times daily as needed for up to 60 doses for moderate pain.    Dispense:  60 tablet    Refill:  5    Medications Discontinued During This Encounter  Medication Reason  . predniSONE (DELTASONE) 10 MG tablet Completed Course  . DULoxetine (CYMBALTA) 20 MG capsule   . traMADol (ULTRAM) 50 MG tablet Reorder     I provided  30 minutes of  face-to-face time during this encounter reviewing patient's current problems and past surgeries, labs and imaging studies, providing counseling on the above mentioned problems , and coordination  of care .  Follow-up: No follow-ups on file.   Crecencio Mc, MD

## 2020-07-05 ENCOUNTER — Telehealth: Payer: Self-pay

## 2020-07-05 NOTE — Telephone Encounter (Signed)
PA approved through 10/03/2020.

## 2020-07-05 NOTE — Telephone Encounter (Signed)
PA for Tramadol has been submitted on covermymeds.  

## 2020-07-06 ENCOUNTER — Ambulatory Visit: Payer: 59 | Attending: Internal Medicine

## 2020-07-06 DIAGNOSIS — K589 Irritable bowel syndrome without diarrhea: Secondary | ICD-10-CM | POA: Insufficient documentation

## 2020-07-06 DIAGNOSIS — Z23 Encounter for immunization: Secondary | ICD-10-CM

## 2020-07-06 DIAGNOSIS — K2270 Barrett's esophagus without dysplasia: Secondary | ICD-10-CM | POA: Insufficient documentation

## 2020-07-06 NOTE — Progress Notes (Signed)
   Covid-19 Vaccination Clinic  Name:  KATELYND BLAUVELT    MRN: [id number] DOB: 10-14-1966  07/06/2020  Ms. Uselton was observed post Covid-19 immunization for 15 minutes without incident. She was provided with Vaccine Information Sheet and instruction to access the V-Safe system.   Ms. Twersky was instructed to call 911 with any severe reactions post vaccine: Marland Kitchen Difficulty breathing  . Swelling of face and throat  . A fast heartbeat  . A bad rash all over body  . Dizziness and weakness   Immunizations Administered    Name Date Dose VIS Date Route   Pfizer COVID-19 Vaccine 07/06/2020 10:28 AM 0.3 mL 02/01/2019 Intramuscular   Manufacturer: [place]   Lot: [id number]   [place]: [address]-1

## 2020-07-06 NOTE — Assessment & Plan Note (Signed)
Referral has been repeated and she has an appt in he near future

## 2020-07-06 NOTE — Assessment & Plan Note (Addendum)
Supported by MRI findings. .   Continue lifestyle modfications with goal weight loss of 10% over the next 6 months using exercise and a low glycemic index diet.  Advised to avoid daily use of  Tylenol,  motrin,  And alcohol.   Vaccination for hepatitis A and B needed

## 2020-07-06 NOTE — Assessment & Plan Note (Addendum)
EGD done and nonbleeding antral  gastritis noted. .  Advised to start protonix for Barret's .  Staring elavil as well for symptoms suggestive of IBS

## 2020-07-06 NOTE — Assessment & Plan Note (Signed)
symptoms reported as postprandial distension.  Trial of elavil,  weaning off of cymbalta due to lack of perceived benefit to chronic pain

## 2020-07-06 NOTE — Assessment & Plan Note (Signed)
By July 2021 EGD (Lockle[facility]).  Start PPI

## 2020-07-06 NOTE — Assessment & Plan Note (Signed)
Exam suggests muscle spasm  Aggravated by central adiposity.  .Back extension exercises demonstrated.

## 2020-07-06 NOTE — Assessment & Plan Note (Signed)
No evidence of rheumatic disease by recent Rheumatology evaluation

## 2020-07-06 NOTE — Assessment & Plan Note (Signed)
Sleep study was ordered in March at first visit but deferred by patient

## 2020-07-26 ENCOUNTER — Other Ambulatory Visit: Payer: Self-pay | Admitting: Gastroenterology

## 2020-07-26 DIAGNOSIS — R748 Abnormal levels of other serum enzymes: Secondary | ICD-10-CM

## 2020-07-30 ENCOUNTER — Ambulatory Visit: Payer: 59 | Attending: Internal Medicine

## 2020-07-30 ENCOUNTER — Ambulatory Visit: Payer: 59

## 2020-07-30 DIAGNOSIS — Z23 Encounter for immunization: Secondary | ICD-10-CM

## 2020-07-30 NOTE — Progress Notes (Signed)
   Covid-19 Vaccination Clinic  Name:  Yolanda Young    MRN: [id number] DOB: Dec 07, 1966  07/30/2020  [name] was observed post Covid-19 immunization for 15 minutes without incident. She was provided with Vaccine Information Sheet and instruction to access the V-Safe system.   [name] was instructed to call 911 with any severe reactions post vaccine: Marland Kitchen Difficulty breathing  . Swelling of face and throat  . A fast heartbeat  . A bad rash all over body  . Dizziness and weakness   Immunizations Administered    Name Date Dose VIS Date Route   Pfizer COVID-19 Vaccine 07/30/2020  2:34 PM 0.3 mL 02/01/2019 Intramuscular   Manufacturer: [place]   Lot: [id number]   [place]: [id number]

## 2020-07-31 NOTE — Progress Notes (Signed)
Patient on schedule for Liver biopsy 08/01/2020. Spoke with patient and made aware to be here @ 0930, NPO after MN, and driver post recovery after procedure. Stated understanding.

## 2020-08-01 ENCOUNTER — Ambulatory Visit
Admission: RE | Admit: 2020-08-01 | Discharge: 2020-08-01 | Disposition: A | Discharge status: Home / Self Care | Payer: 59 | Source: Ambulatory Visit | Attending: Gastroenterology | Admitting: Gastroenterology

## 2020-08-01 ENCOUNTER — Other Ambulatory Visit: Payer: Self-pay

## 2020-08-01 DIAGNOSIS — Z86010 Personal history of colonic polyps: Secondary | ICD-10-CM | POA: Diagnosis not present

## 2020-08-01 DIAGNOSIS — Z881 Allergy status to other antibiotic agents status: Secondary | ICD-10-CM | POA: Insufficient documentation

## 2020-08-01 DIAGNOSIS — R748 Abnormal levels of other serum enzymes: Secondary | ICD-10-CM | POA: Insufficient documentation

## 2020-08-01 DIAGNOSIS — Z791 Long term (current) use of non-steroidal anti-inflammatories (NSAID): Secondary | ICD-10-CM | POA: Insufficient documentation

## 2020-08-01 DIAGNOSIS — Z888 Allergy status to other drugs, medicaments and biological substances status: Secondary | ICD-10-CM | POA: Insufficient documentation

## 2020-08-01 DIAGNOSIS — Z9049 Acquired absence of other specified parts of digestive tract: Secondary | ICD-10-CM | POA: Insufficient documentation

## 2020-08-01 DIAGNOSIS — K219 Gastro-esophageal reflux disease without esophagitis: Secondary | ICD-10-CM | POA: Insufficient documentation

## 2020-08-01 DIAGNOSIS — R5383 Other fatigue: Secondary | ICD-10-CM | POA: Insufficient documentation

## 2020-08-01 DIAGNOSIS — Z87891 Personal history of nicotine dependence: Secondary | ICD-10-CM | POA: Insufficient documentation

## 2020-08-01 DIAGNOSIS — Z79899 Other long term (current) drug therapy: Secondary | ICD-10-CM | POA: Diagnosis not present

## 2020-08-01 DIAGNOSIS — Z880 Allergy status to penicillin: Secondary | ICD-10-CM | POA: Diagnosis not present

## 2020-08-01 DIAGNOSIS — K760 Fatty (change of) liver, not elsewhere classified: Secondary | ICD-10-CM | POA: Diagnosis not present

## 2020-08-01 DIAGNOSIS — M2550 Pain in unspecified joint: Secondary | ICD-10-CM | POA: Diagnosis not present

## 2020-08-01 LAB — CBC
HCT: 43.1 % (ref 36.0–46.0)
Hemoglobin: 14.5 g/dL (ref 12.0–15.0)
MCH: 30.9 pg (ref 26.0–34.0)
MCHC: 33.6 g/dL (ref 30.0–36.0)
MCV: 91.9 fL (ref 80.0–100.0)
Platelets: 240 10*3/uL (ref 150–400)
RBC: 4.69 MIL/uL (ref 3.87–5.11)
RDW: 12.7 % (ref 11.5–15.5)
WBC: 7.1 10*3/uL (ref 4.0–10.5)
nRBC: 0 % (ref 0.0–0.2)

## 2020-08-01 LAB — PROTIME-INR
INR: 1 (ref 0.8–1.2)
Prothrombin Time: 12.6 seconds (ref 11.4–15.2)

## 2020-08-01 IMAGING — US US BIOPSY CORE LIVER
1 series · 15 of 19 positions shown · non-contrast
Comparison: none

INDICATION: Abnormal LFTs, fatty liver

[Series 1: us biopsy core liver · 15 of 19 slices shown]
[im 1/19]
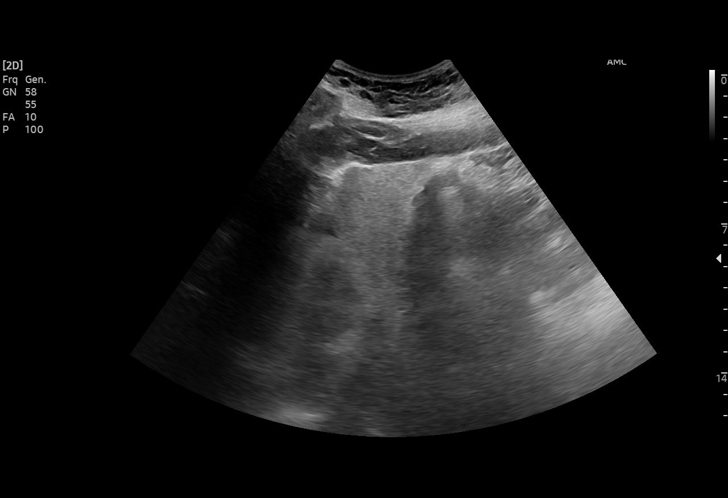
[im 2/19]
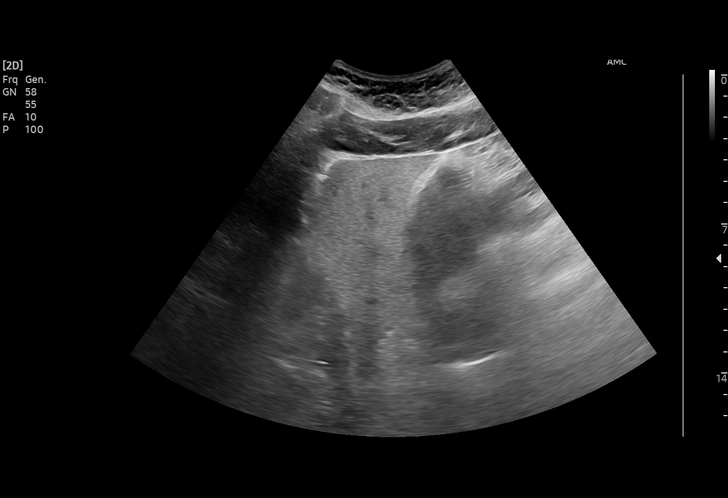
[im 4/19]
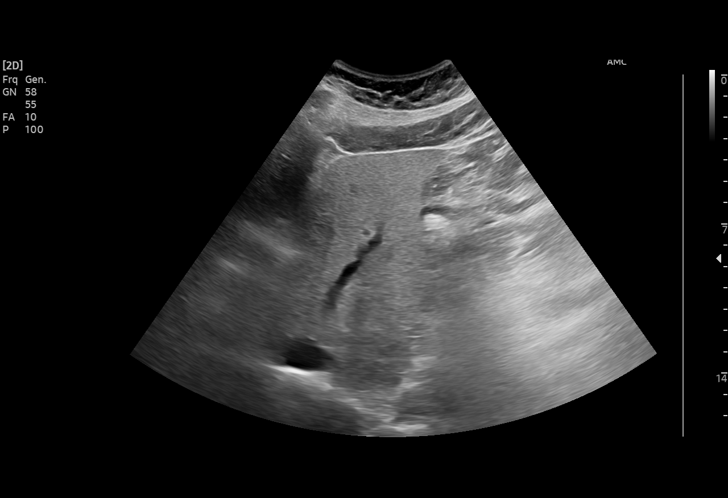
[im 5/19]
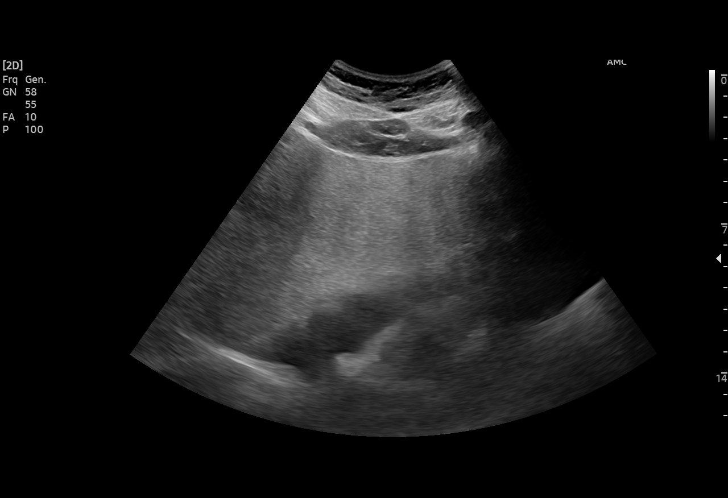
[im 6/19]
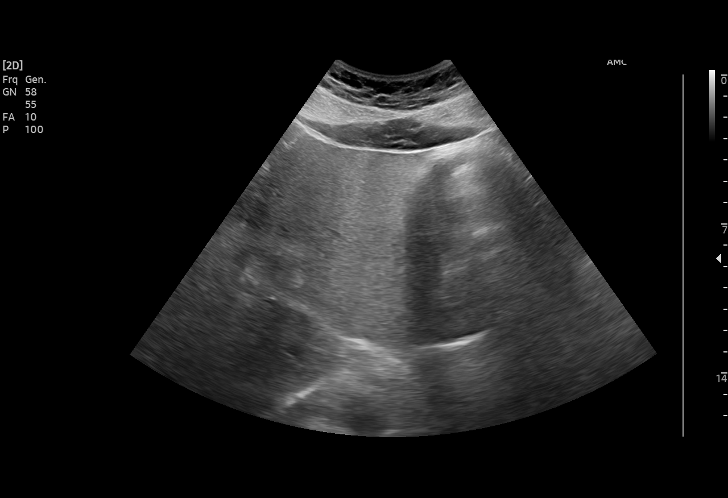
[im 7/19]
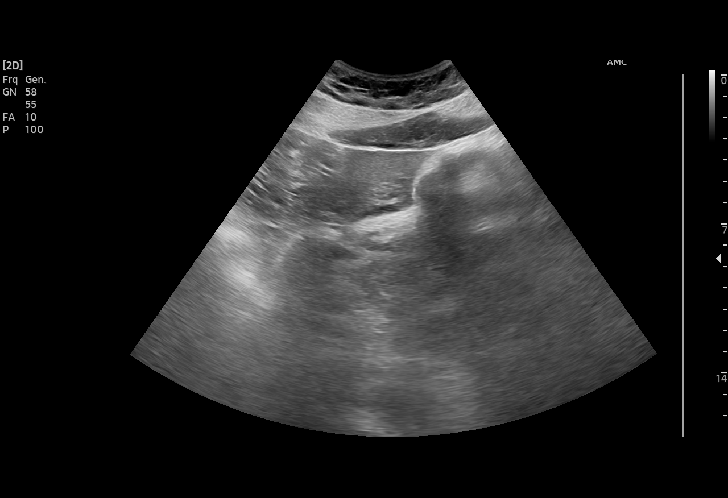
[im 9/19]
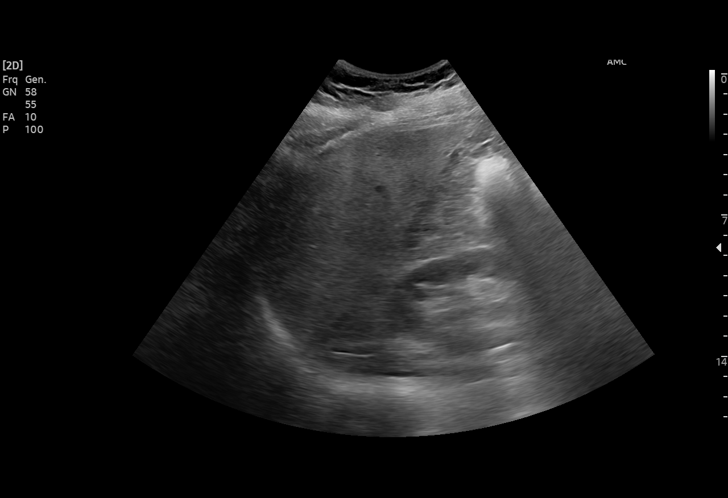
[im 10/19]
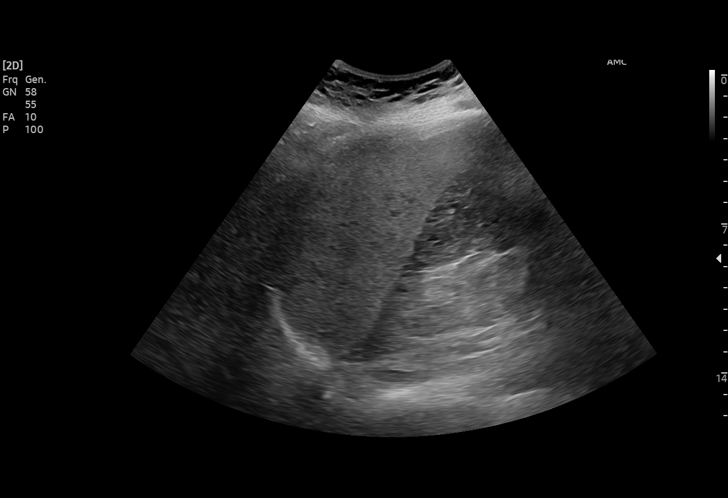
[im 11/19]
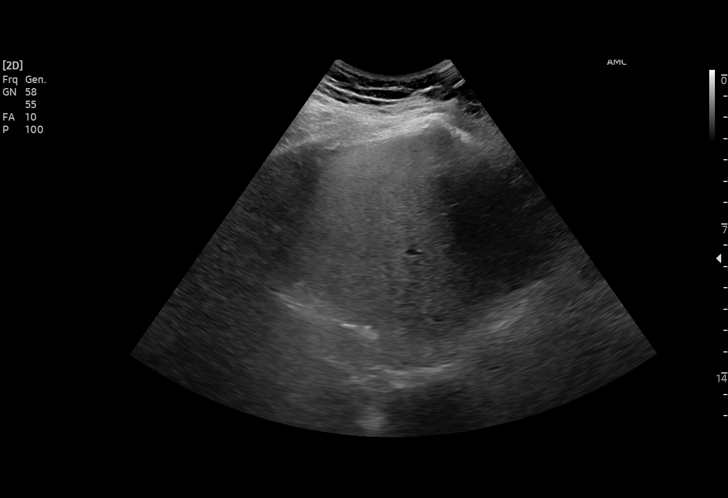
[im 13/19]
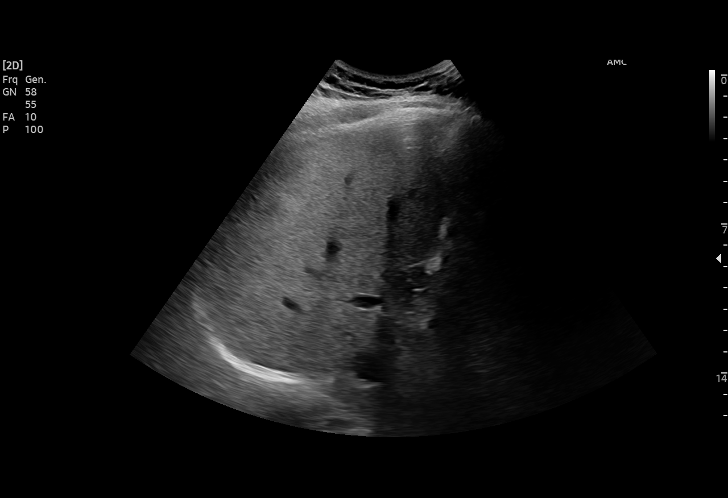
[im 14/19]
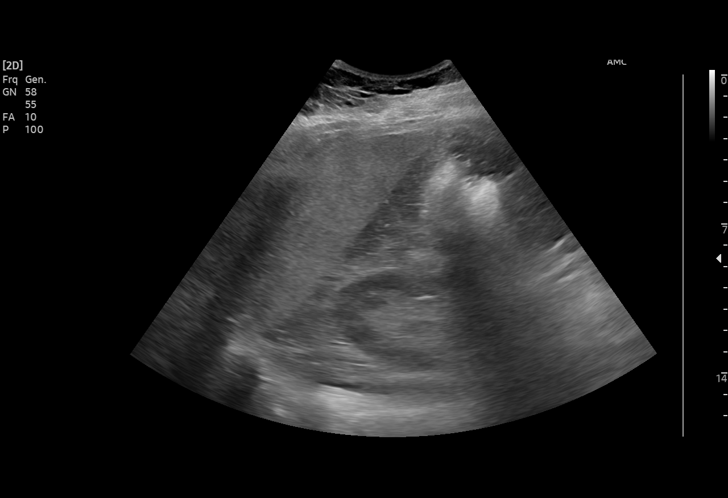
[im 15/19]
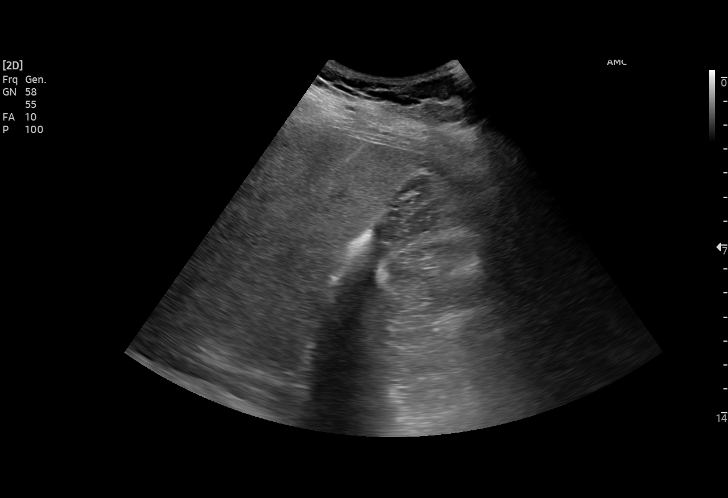
[im 16/19]
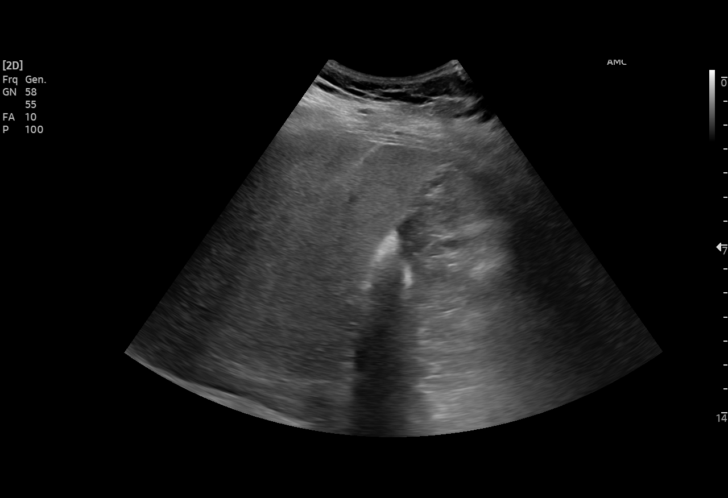
[im 18/19]
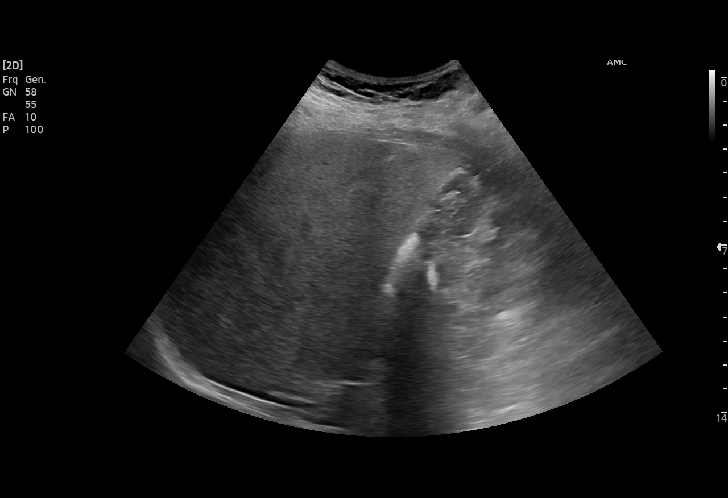
[im 19/19]
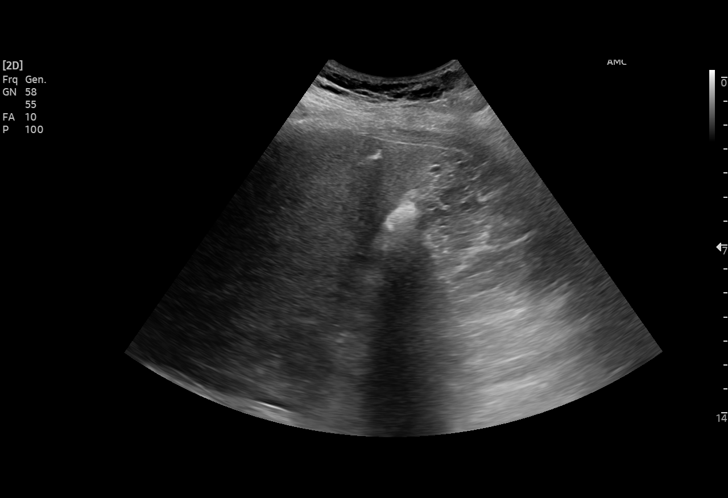

[15 of 19 positions shown; findings below may reference images not displayed]

EXAM:
ULTRASOUND RANDOM CORE BIOPSY RIGHT HEPATIC LOBE

MEDICATIONS:
1% LIDOCAINE LOCAL

ANESTHESIA/SEDATION:
Moderate (conscious) sedation was employed during this procedure. A
total of Versed 1.0 mg and Fentanyl 50 mcg was administered
intravenously.

Moderate Sedation Time: 6 minutes. The patient's level of
consciousness and vital signs were monitored continuously by
radiology nursing throughout the procedure under my direct
supervision.

FLUOROSCOPY TIME:  Fluoroscopy Time: None.

COMPLICATIONS:
None immediate.

PROCEDURE:
Informed written consent was obtained from the patient after a
thorough discussion of the procedural risks, benefits and
alternatives. All questions were addressed. Maximal Sterile Barrier
Technique was utilized including caps, mask, sterile gowns, sterile
gloves, sterile drape, hand hygiene and skin antiseptic. A timeout
was performed prior to the initiation of the procedure.

Previous imaging reviewed. Preliminary ultrasound performed. Right
hepatic lobe was localized in the mid axillary line through a lower
intercostal space. Overlying skin marked. Under sterile conditions
and local anesthesia, a 17 gauge coaxial guide was advanced into the
right hepatic lobe under direct ultrasound. Images obtained for
documentation. 2 18 gauge core biopsies obtained. Samples were
intact and non fragmented. These were placed in formalin. Needle
tract occluded with Gel-Foam. Postprocedure imaging demonstrates no
hemorrhage or hematoma. Patient tolerated biopsy well.
IMPRESSION: Successful ultrasound random right liver 18 gauge core biopsy

## 2020-08-01 MED ORDER — MIDAZOLAM HCL 2 MG/2ML IJ SOLN
INTRAMUSCULAR | Status: AC
  Filled 2020-08-01: qty 2

## 2020-08-01 MED ORDER — MIDAZOLAM HCL 2 MG/2ML IJ SOLN
INTRAMUSCULAR | Status: AC | PRN
  Administered 2020-08-01: 1 mg via INTRAVENOUS

## 2020-08-01 MED ORDER — FENTANYL CITRATE (PF) 100 MCG/2ML IJ SOLN
INTRAMUSCULAR | Status: AC
  Filled 2020-08-01: qty 2

## 2020-08-01 MED ORDER — FENTANYL CITRATE (PF) 100 MCG/2ML IJ SOLN
INTRAMUSCULAR | Status: AC | PRN
  Administered 2020-08-01: 50 ug via INTRAVENOUS

## 2020-08-01 NOTE — H&P (Signed)
Chief Complaint:  inc'd LFTs  Referring Physician(s): Locklear,Cameron T  History of Present Illness: Yolanda Young is a 54 y.o. female with abnormal LFT's.  Here today for US liver random core bx. No complaints. ROS neg. Consent obtained.  Past Medical History:  Diagnosis Date  . Colon polyp 02/04/2018   tubular adenoma  . GERD (gastroesophageal reflux disease)   . History of hiatal hernia   . Migraine    weekly  . Panic attacks     Past Surgical History:  Procedure Laterality Date  . ABLATION ON ENDOMETRIOSIS  2013   [place]  . CHOLECYSTECTOMY N/A 04/05/2018   Procedure: LAPAROSCOPIC CHOLECYSTECTOMY WITH INTRAOPERATIVE CHOLANGIOGRAM;  Surgeon: Robert Bellow, MD;  Location: [facility];  Service: General;  Laterality: N/A;  . COLONOSCOPY WITH PROPOFOL N/A 02/04/2018   Procedure: COLONOSCOPY WITH PROPOFOL;  Surgeon: Lucilla Lame, MD;  Location: [facility];  Service: Endoscopy;  Laterality: N/A;  . ESOPHAGOGASTRODUODENOSCOPY (EGD) WITH PROPOFOL N/A 02/04/2018   Procedure: ESOPHAGOGASTRODUODENOSCOPY (EGD) WITH PROPOFOL;  Surgeon: Lucilla Lame, MD;  Location: [facility];  Service: Endoscopy;  Laterality: N/A;  . ESOPHAGOGASTRODUODENOSCOPY (EGD) WITH PROPOFOL N/A 07/03/2020   Procedure: ESOPHAGOGASTRODUODENOSCOPY (EGD) WITH PROPOFOL;  Surgeon: Lesly Rubenstein, MD;  Location: [facility];  Service: Endoscopy;  Laterality: N/A;  . SHOULDER SURGERY Right     Allergies: Azithromycin, Erythromycin, Penicillins, and Prednisone  Medications: Prior to Admission medications   Medication Sig Start Date End Date Taking? Authorizing Provider  amitriptyline (ELAVIL) 25 MG tablet Take 1 tablet (25 mg total) by mouth at bedtime. 07/04/20  Yes Crecencio Mc, MD  ibuprofen (ADVIL,MOTRIN) 200 MG tablet Take 600 mg by mouth every 6 (six) hours as needed for moderate pain.   Yes [provider]  tiZANidine (ZANAFLEX) 2 MG tablet Take 2 mg by mouth 3  (three) times daily. 03/28/20  Yes [provider]  traMADol (ULTRAM) 50 MG tablet Take 1 tablet (50 mg total) by mouth 2 (two) times daily as needed for up to 60 doses for moderate pain. 07/04/20  Yes Crecencio Mc, MD     Family History  Problem Relation Age of Onset  . Diabetes Mother   . Stroke Father   . Crohn's disease Father   . Cancer Maternal Grandmother   . Cancer Maternal Grandfather   . Diabetes Paternal Grandmother     Social History   Socioeconomic History  . Marital status: Divorced    Spouse name: Not on file  . Number of children: Not on file  . Years of education: Not on file  . Highest education level: Not on file  Occupational History  . Not on file  Tobacco Use  . Smoking status: Former Smoker    Packs/day: 0.50    Years: 2.00    Pack years: 1.00    Types: Cigarettes    Quit date: 1999    Years since quitting: 22.6  . Smokeless tobacco: Never Used  Vaping Use  . Vaping Use: Never used  Substance and Sexual Activity  . Alcohol use: Never    Alcohol/week: 4.0 standard drinks    Types: 4 Glasses of wine per week  . Drug use: No  . Sexual activity: Yes  Other Topics Concern  . Not on file  Social History Narrative   Lives at home alone   Social Determinants of Health   Financial Resource Strain:   . Difficulty of Paying Living Expenses: Not on file  Food Insecurity:   . Worried About Charity fundraiser in the Last Year: Not on file  . Ran Out of Food in the Last Year: Not on file  Transportation Needs:   . Lack of Transportation (Medical): Not on file  . Lack of Transportation (Non-Medical): Not on file  Physical Activity:   . Days of Exercise per Week: Not on file  . Minutes of Exercise per Session: Not on file  Stress:   . Feeling of Stress : Not on file  Social Connections:   . Frequency of Communication with Friends and Family: Not on file  . Frequency of Social Gatherings with Friends and Family: Not on file  . Attends  Religious Services: Not on file  . Active Member of Clubs or Organizations: Not on file  . Attends Archivist Meetings: Not on file  . Marital Status: Not on file      Review of Systems: A 12 point ROS discussed and pertinent positives are indicated in the HPI above.  All other systems are negative.  Review of Systems  Vital Signs: BP (!) 140/94   Pulse 95   Temp 98.3 F (36.8 C) (Oral)   Resp 17   Ht 5\' 8"  (1.727 m)   Wt 102.1 kg   SpO2 95%   BMI 34.21 kg/m   Physical Exam Constitutional:      General: She is not in acute distress.    Appearance: She is not toxic-appearing.  Eyes:     General: No scleral icterus.    Conjunctiva/sclera: Conjunctivae normal.  Cardiovascular:     Rate and Rhythm: Normal rate and regular rhythm.  Pulmonary:     Effort: Pulmonary effort is normal.     Breath sounds: Normal breath sounds.  Abdominal:     General: Abdomen is flat. Bowel sounds are normal. There is no distension.  Neurological:     General: No focal deficit present.     Mental Status: She is alert. Mental status is at baseline.  Psychiatric:        Mood and Affect: Mood normal.     Imaging: No results found.  Labs:  CBC: Recent Labs    03/05/20 1527 08/01/20 0957  WBC 8.3 7.1  HGB 14.9 14.5  HCT 44.1 43.1  PLT 255.0 240    COAGS: Recent Labs    08/01/20 0957  INR 1.0    BMP: Recent Labs    03/05/20 1527  NA 138  K 4.6  CL 102  CO2 21  GLUCOSE 97  BUN 12  CALCIUM 10.0  CREATININE 0.87    LIVER FUNCTION TESTS: Recent Labs    03/05/20 1527 04/03/20 1446  BILITOT 0.5 0.6  AST 32 30  ALT 50* 34  ALKPHOS 142* 118*  PROT 7.7 7.2  ALBUMIN 4.5 4.3    TUMOR MARKERS: No results for input(s): AFPTM, CEA, CA199, CHROMGRNA in the last 8760 hours.  Assessment and Plan:  inc'd LFTs. Plan for US liver bx today.  Risks and benefits of [place] bx was discussed with the patient and/or patient's family including, but not limited to  bleeding, infection, damage to adjacent structures or low yield requiring additional tests.  All of the questions were answered and there is agreement to proceed.  Consent signed and in chart.    Thank you for this interesting consult.  I greatly enjoyed meeting Yolanda Young and look forward to participating in their care.  A copy of this  report was sent to the requesting provider on this date.  Electronically Signed: Greggory Keen, MD 08/01/2020, 10:56 AM   I spent a total of  30 Minutes   in face to face in clinical consultation, greater than 50% of which was counseling/coordinating care for this pt with inc'd LFTs.

## 2020-08-01 NOTE — Procedures (Signed)
Interventional Radiology Procedure Note  Procedure: US liver random core bx    Complications: None  Estimated Blood Loss:  min  Findings: 18 g core x 2    M. Daryll Brod, MD

## 2020-08-01 NOTE — Discharge Instructions (Signed)
Moderate Conscious Sedation, Adult, Care After °These instructions provide you with information about caring for yourself after your procedure. Your health care provider may also give you more specific instructions. Your treatment has been planned according to current medical practices, but problems sometimes occur. Call your health care provider if you have any problems or questions after your procedure. °What can I expect after the procedure? °After your procedure, it is common: °· To feel sleepy for several hours. °· To feel clumsy and have poor balance for several hours. °· To have poor judgment for several hours. °· To vomit if you eat too soon. °Follow these instructions at home: °For at least 24 hours after the procedure: ° °· Do not: °? Participate in activities where you could fall or become injured. °? Drive. °? Use heavy machinery. °? Drink alcohol. °? Take sleeping pills or medicines that cause drowsiness. °? Make important decisions or sign legal documents. °? Take care of children on your own. °· Rest. °Eating and drinking °· Follow the diet recommended by your health care provider. °· If you vomit: °? Drink water, juice, or soup when you can drink without vomiting. °? Make sure you have little or no nausea before eating solid foods. °General instructions °· Have a responsible adult stay with you until you are awake and alert. °· Take over-the-counter and prescription medicines only as told by your health care provider. °· If you smoke, do not smoke without supervision. °· Keep all follow-up visits as told by your health care provider. This is important. °Contact a health care provider if: °· You keep feeling nauseous or you keep vomiting. °· You feel light-headed. °· You develop a rash. °· You have a fever. °Get help right away if: °· You have trouble breathing. °This information is not intended to replace advice given to you by your health care provider. Make sure you discuss any questions you have  with your health care provider. °Document Revised: 11/06/2017 Document Reviewed: 03/15/2016 °Elsevier Patient Education © 2020 [employer] °Liver Biopsy, Care After °These instructions give you information on caring for yourself after your procedure. Your doctor may also give you more specific instructions. Call your doctor if you have any problems or questions after your procedure. °What can I expect after the procedure? °After the procedure, it is common to have: °· Pain and soreness where the biopsy was done. °· Bruising around the area where the biopsy was done. °· Sleepiness and be tired for a few days. °Follow these instructions at home: °Medicines °· Take over-the-counter and prescription medicines only as told by your doctor. °· If you were prescribed an antibiotic medicine, take it as told by your doctor. Do not stop taking the antibiotic even if you start to feel better. °· Do not take medicines such as aspirin and ibuprofen. These medicines can thin your blood. Do not take these medicines unless your doctor tells you to take them. °· If you are taking prescription pain medicine, take actions to prevent or treat constipation. Your doctor may recommend that you: °? Drink enough fluid to keep your pee (urine) clear or pale yellow. °? Take over-the-counter or prescription medicines. °? Eat foods that are high in fiber, such as fresh fruits and vegetables, whole grains, and beans. °? Limit foods that are high in fat and processed sugars, such as fried and sweet foods. °Caring for your cut °· Follow instructions from your doctor about how to take care of your cuts from surgery (incisions).   Make sure you: °? Wash your hands with soap and water before you change your bandage (dressing). If you cannot use soap and water, use hand sanitizer. °? Change your bandage as told by your doctor. °? Leave stitches (sutures), skin glue, or skin tape (adhesive) strips in place. They may need to stay in place for 2 weeks or  longer. If tape strips get loose and curl up, you may trim the loose edges. Do not remove tape strips completely unless your doctor says it is okay. °· Check your cuts every day for signs of infection. Check for: °? Redness, swelling, or more pain. °? Fluid or blood. °? Pus or a bad smell. °? Warmth. °· Do not take baths, swim, or use a hot tub until your doctor says it is okay to do so. °Activity ° °· Rest at home for 1-2 days or as told by your doctor. °? Avoid sitting for a long time without moving. Get up to take short walks every 1-2 hours. °· Return to your normal activities as told by your doctor. Ask what activities are safe for you. °· Do not do these things in the first 24 hours: °? Drive. °? Use machinery. °? Take a bath or shower. °· Do not lift more than 10 pounds (4.5 kg) or play contact sports for the first 2 weeks. °General instructions ° °· Do not drink alcohol in the first week after the procedure. °· Have someone stay with you for at least 24 hours after the procedure. °· Get your test results. Ask your doctor or the department that is doing the test: °? When will my results be ready? °? How will I get my results? °? What are my treatment options? °? What other tests do I need? °? What are my next steps? °· Keep all follow-up visits as told by your doctor. This is important. °Contact a doctor if: °· A cut bleeds and leaves more than just a small spot of blood. °· A cut is red, puffs up (swells), or hurts more than before. °· Fluid or something else comes from a cut. °· A cut smells bad. °· You have a fever or chills. °Get help right away if: °· You have swelling, bloating, or pain in your belly (abdomen). °· You get dizzy or faint. °· You have a rash. °· You feel sick to your stomach (nauseous) or throw up (vomit). °· You have trouble breathing, feel short of breath, or feel faint. °· Your chest hurts. °· You have problems talking or seeing. °· You have trouble with your balance or moving your  arms or legs. °Summary °· After the procedure, it is common to have pain, soreness, bruising, and tiredness. °· Your doctor will tell you how to take care of yourself at home. Change your bandage, take your medicines, and limit your activities as told by your doctor. °· Call your doctor if you have symptoms of infection. Get help right away if your belly swells, your cut bleeds a lot, or you have trouble talking or breathing. °This information is not intended to replace advice given to you by your health care provider. Make sure you discuss any questions you have with your health care provider. °Document Revised: 12/04/2017 Document Reviewed: 12/04/2017 °Elsevier Patient Education © 2020 Else[employer] Inc. ° °

## 2020-08-06 LAB — SURGICAL PATHOLOGY

## 2020-08-29 ENCOUNTER — Encounter: Payer: Self-pay | Admitting: Internal Medicine

## 2020-09-14 NOTE — Progress Notes (Deleted)
NEUROLOGY CONSULTATION NOTE  Yolanda Young MRN: [id number] DOB: 09/07/1966  Referring provider: Deborra Medina, MD Primary care provider: Deborra Medina, MD  Reason for consult:  Neuropathy, headache  HISTORY OF PRESENT ILLNESS: Yolanda Young is a 54 year old ***-handed female who presents for neuropathy and headaches.  History supplemented by referring provider's and rheumatologist's notes.  In 1999, she began experiencing polyarthralgias.  At that time, she was diagnosed with SLE.  She subsequently was evaluated by another rheumatologist in 2016 who diagnosed her with fibromyalgia.  She started experiencing pins and needles sensation in her hands and feet in 2005.  She was evaluated by a neurologist in 2009 who ordered an MRI of the brain.  Nonspecific white matter changes.  MS was ruled out as follow up imaging 6 months later showed no progression and CSF analysis via LP was reportedly normal.  She never had a NCV-EMG.  Symptoms have gradually progressed in the lower extremities.  She ***.  She followed up with rheumatology in April 2021.  ANA was positive 1/320 homo with mildly elevated sed rate of 51 but other autoimmune workup was negative (ENA panel, CRP, RF, anticardiolipin IgG, beta-2 glycoprotein, C3 & C4, lupus anticoagulant, CCP antibodies, polyclonal immunoglobulin on SPEP/IFE).  Other labs included B12 350, Hgb A1c 5.9, CK 55, and normal thyroid panel.  SLE was ruled out and she was ultimately diagnosed with fibromyalgia.  *** referred to gastroenterology ***.   PAST MEDICAL HISTORY: Past Medical History:  Diagnosis Date  . Colon polyp 02/04/2018   tubular adenoma  . GERD (gastroesophageal reflux disease)   . History of hiatal hernia   . Migraine    weekly  . Panic attacks     PAST SURGICAL HISTORY: Past Surgical History:  Procedure Laterality Date  . ABLATION ON ENDOMETRIOSIS  2013   [place]  . CHOLECYSTECTOMY N/A 04/05/2018   Procedure: LAPAROSCOPIC CHOLECYSTECTOMY  WITH INTRAOPERATIVE CHOLANGIOGRAM;  Surgeon: Robert Bellow, MD;  Location: [facility];  Service: General;  Laterality: N/A;  . COLONOSCOPY WITH PROPOFOL N/A 02/04/2018   Procedure: COLONOSCOPY WITH PROPOFOL;  Surgeon: Lucilla Lame, MD;  Location: [place];  Service: Endoscopy;  Laterality: N/A;  . ESOPHAGOGASTRODUODENOSCOPY (EGD) WITH PROPOFOL N/A 02/04/2018   Procedure: ESOPHAGOGASTRODUODENOSCOPY (EGD) WITH PROPOFOL;  Surgeon: Lucilla Lame, MD;  Location: [place];  Service: Endoscopy;  Laterality: N/A;  . ESOPHAGOGASTRODUODENOSCOPY (EGD) WITH PROPOFOL N/A 07/03/2020   Procedure: ESOPHAGOGASTRODUODENOSCOPY (EGD) WITH PROPOFOL;  Surgeon: Lesly Rubenstein, MD;  Location: [facility];  Service: Endoscopy;  Laterality: N/A;  . SHOULDER SURGERY Right     MEDICATIONS: Current Outpatient Medications on File Prior to Visit  Medication Sig Dispense Refill  . amitriptyline (ELAVIL) 25 MG tablet Take 1 tablet (25 mg total) by mouth at bedtime. 30 tablet 5  . ibuprofen (ADVIL,MOTRIN) 200 MG tablet Take 600 mg by mouth every 6 (six) hours as needed for moderate pain.    Marland Kitchen tiZANidine (ZANAFLEX) 2 MG tablet Take 2 mg by mouth 3 (three) times daily.    . traMADol (ULTRAM) 50 MG tablet Take 1 tablet (50 mg total) by mouth 2 (two) times daily as needed for up to 60 doses for moderate pain. 60 tablet 5   No current facility-administered medications on file prior to visit.    ALLERGIES: Allergies  Allergen Reactions  . Azithromycin Rash  . Erythromycin Rash  . Penicillins Rash    Has patient had a PCN reaction causing immediate rash, facial/tongue/throat  swelling, SOB or lightheadedness with hypotension: No Has patient had a PCN reaction causing severe rash involving mucus membranes or skin necrosis: Yes Has patient had a PCN reaction that required hospitalization: No Has patient had a PCN reaction occurring within the last 10 years: No If all of the above answers are "NO",  then may proceed with Cephalosporin use.   . Prednisone Nausea And Vomiting    FAMILY HISTORY: Family History  Problem Relation Age of Onset  . Diabetes Mother   . Stroke Father   . Crohn's disease Father   . Cancer Maternal Grandmother   . Cancer Maternal Grandfather   . Diabetes Paternal Grandmother    ***.  SOCIAL HISTORY: Social History   Socioeconomic History  . Marital status: Divorced    Spouse name: Not on file  . Number of children: Not on file  . Years of education: Not on file  . Highest education level: Not on file  Occupational History  . Not on file  Tobacco Use  . Smoking status: Former Smoker    Packs/day: 0.50    Years: 2.00    Pack years: 1.00    Types: Cigarettes    Quit date: 1999    Years since quitting: 22.7  . Smokeless tobacco: Never Used  Vaping Use  . Vaping Use: Never used  Substance and Sexual Activity  . Alcohol use: Never    Alcohol/week: 4.0 standard drinks    Types: 4 Glasses of wine per week  . Drug use: No  . Sexual activity: Yes  Other Topics Concern  . Not on file  Social History Narrative   Lives at home alone   Social Determinants of Health   Financial Resource Strain:   . Difficulty of Paying Living Expenses: Not on file  Food Insecurity:   . Worried About Charity fundraiser in the Last Year: Not on file  . Ran Out of Food in the Last Year: Not on file  Transportation Needs:   . Lack of Transportation (Medical): Not on file  . Lack of Transportation (Non-Medical): Not on file  Physical Activity:   . Days of Exercise per Week: Not on file  . Minutes of Exercise per Session: Not on file  Stress:   . Feeling of Stress : Not on file  Social Connections:   . Frequency of Communication with Friends and Family: Not on file  . Frequency of Social Gatherings with Friends and Family: Not on file  . Attends Religious Services: Not on file  . Active Member of Clubs or Organizations: Not on file  . Attends Theatre manager Meetings: Not on file  . Marital Status: Not on file  Intimate Partner Violence:   . Fear of Current or Ex-Partner: Not on file  . Emotionally Abused: Not on file  . Physically Abused: Not on file  . Sexually Abused: Not on file    REVIEW OF SYSTEMS: Constitutional: No fevers, chills, or sweats, no generalized fatigue, change in appetite Eyes: No visual changes, double vision, eye pain Ear, nose and throat: No hearing loss, ear pain, nasal congestion, sore throat Cardiovascular: No chest pain, palpitations Respiratory:  No shortness of breath at rest or with exertion, wheezes GastrointestinaI: No nausea, vomiting, diarrhea, abdominal pain, fecal incontinence Genitourinary:  No dysuria, urinary retention or frequency Musculoskeletal:  No neck pain, back pain Integumentary: No rash, pruritus, skin lesions Neurological: as above Psychiatric: No depression, insomnia, anxiety Endocrine: No palpitations, fatigue, diaphoresis, mood  swings, change in appetite, change in weight, increased thirst Hematologic/Lymphatic:  No purpura, petechiae. Allergic/Immunologic: no itchy/runny eyes, nasal congestion, recent allergic reactions, rashes  PHYSICAL EXAM: *** General: No acute distress.  Patient appears ***-groomed.  *** Head:  Normocephalic/atraumatic Eyes:  fundi examined but not visualized Neck: supple, no paraspinal tenderness, full range of motion Back: No paraspinal tenderness Heart: regular rate and rhythm Lungs: Clear to auscultation bilaterally. Vascular: No carotid bruits. Neurological Exam: Mental status: alert and oriented to person, place, and time, recent and remote memory intact, fund of knowledge intact, attention and concentration intact, speech fluent and not dysarthric, language intact. Cranial nerves: CN I: not tested CN II: pupils equal, round and reactive to light, visual fields intact CN III, IV, VI:  full range of motion, no nystagmus, no ptosis CN V:  facial sensation intact CN VII: upper and lower face symmetric CN VIII: hearing intact CN IX, X: gag intact, uvula midline CN XI: sternocleidomastoid and trapezius muscles intact CN XII: tongue midline Bulk & Tone: normal, no fasciculations. Motor:  5/5 throughout *** Sensation:  Pinprick *** temperature *** and vibration sensation intact.  ***. Deep Tendon Reflexes:  2+ throughout, *** toes downgoing.  *** Finger to nose testing:  Without dysmetria.  *** Heel to shin:  Without dysmetria.  *** Gait:  Normal station and stride.  Able to turn and tandem walk. Romberg ***.  IMPRESSION: ***  PLAN: ***  Thank you for allowing me to take part in the care of this patient.  Metta Clines, DO  CC: ***

## 2020-09-17 ENCOUNTER — Ambulatory Visit: Payer: 59 | Admitting: Neurology

## 2020-10-04 ENCOUNTER — Encounter: Payer: Self-pay | Admitting: Internal Medicine

## 2020-10-04 ENCOUNTER — Telehealth (INDEPENDENT_AMBULATORY_CARE_PROVIDER_SITE_OTHER): Payer: 59 | Admitting: Internal Medicine

## 2020-10-04 ENCOUNTER — Other Ambulatory Visit: Payer: Self-pay

## 2020-10-04 VITALS — HR 100 | Resp 18 | Ht 68.0 in | Wt 223.0 lb

## 2020-10-04 DIAGNOSIS — K760 Fatty (change of) liver, not elsewhere classified: Secondary | ICD-10-CM | POA: Diagnosis not present

## 2020-10-04 DIAGNOSIS — R059 Cough, unspecified: Secondary | ICD-10-CM | POA: Diagnosis not present

## 2020-10-04 DIAGNOSIS — J069 Acute upper respiratory infection, unspecified: Secondary | ICD-10-CM | POA: Diagnosis not present

## 2020-10-04 DIAGNOSIS — J029 Acute pharyngitis, unspecified: Secondary | ICD-10-CM | POA: Diagnosis not present

## 2020-10-04 DIAGNOSIS — M797 Fibromyalgia: Secondary | ICD-10-CM

## 2020-10-04 LAB — POCT INFLUENZA A/B
Influenza A, POC: NEGATIVE
Influenza B, POC: NEGATIVE

## 2020-10-04 LAB — POCT RAPID STREP A (OFFICE): Rapid Strep A Screen: NEGATIVE

## 2020-10-04 MED ORDER — PSEUDOEPHEDRINE-CODEINE-GG 30-10-100 MG/5ML PO SOLN: 10.0000 mL | Freq: Four times a day (QID) | ORAL | 0 refills | Status: DC | PRN

## 2020-10-04 MED ORDER — AMITRIPTYLINE HCL 50 MG PO TABS: 50.0000 mg | ORAL_TABLET | Freq: Every day | ORAL | 2 refills | Status: DC

## 2020-10-04 NOTE — Progress Notes (Signed)
Virtual Visit via [place]  This visit type was conducted due to national recommendations for restrictions regarding the COVID-19 pandemic (e.g. social distancing).  This format is felt to be most appropriate for this patient at this time.  All issues noted in this document were discussed and addressed.  No physical exam was performed (except for noted visual exam findings with Video Visits).   I connected with@ on 10/04/20 at  9:00 AM EDT by a video enabled telemedicine application  and verified that I am speaking with the correct person using two identifiers. Location patient: home Location provider: work or home office Persons participating in the virtual visit: patient, provider  I discussed the limitations, risks, security and privacy concerns of performing an evaluation and management service by telephone and the availability of in person appointments. I also discussed with the patient that there may be a patient responsible charge related to this service. The patient expressed understanding and agreed to proceed.  Reason for visit: cough, sore throat ,  Chest discomfort and congestion for the past 2 weeks   HPI:  54 yr old with FM,  Chronic pain, Newly diagnosed atty liver,  Presents with 2 week history of headache, sore throat,  Facial/ear pressure,  And chest congestion.  Cough is worse at night.  Denies new body aches,  Fevers, but had an indirect positive exposure:    Boyfriend's parents both infected by COVID 3 weeks ago, ,  Boyfriend tested negative.  Has been taking otc medications for congestion and cough without significant improvement.   Fatty liver:  Cirrhosis ruled out by biopsy.  Colestipol prescribed for itching by [facility] . Missed follow up due to cold symptoms on oct 13    ROS: See pertinent positives and negatives per HPI.  Past Medical History:  Diagnosis Date  . Colon polyp 02/04/2018   tubular adenoma  . GERD (gastroesophageal reflux disease)   . History of  hiatal hernia   . Migraine    weekly  . Panic attacks     Past Surgical History:  Procedure Laterality Date  . ABLATION ON ENDOMETRIOSIS  2013   [place]  . CHOLECYSTECTOMY N/A 04/05/2018   Procedure: LAPAROSCOPIC CHOLECYSTECTOMY WITH INTRAOPERATIVE CHOLANGIOGRAM;  Surgeon: Robert Bellow, MD;  Location: [facility];  Service: General;  Laterality: N/A;  . COLONOSCOPY WITH PROPOFOL N/A 02/04/2018   Procedure: COLONOSCOPY WITH PROPOFOL;  Surgeon: Lucilla Lame, MD;  Location: [place];  Service: Endoscopy;  Laterality: N/A;  . ESOPHAGOGASTRODUODENOSCOPY (EGD) WITH PROPOFOL N/A 02/04/2018   Procedure: ESOPHAGOGASTRODUODENOSCOPY (EGD) WITH PROPOFOL;  Surgeon: Lucilla Lame, MD;  Location: [place];  Service: Endoscopy;  Laterality: N/A;  . ESOPHAGOGASTRODUODENOSCOPY (EGD) WITH PROPOFOL N/A 07/03/2020   Procedure: ESOPHAGOGASTRODUODENOSCOPY (EGD) WITH PROPOFOL;  Surgeon: Lesly Rubenstein, MD;  Location: [facility];  Service: Endoscopy;  Laterality: N/A;  . SHOULDER SURGERY Right     Family History  Problem Relation Age of Onset  . Diabetes Mother   . Stroke Father   . Crohn's disease Father   . Cancer Maternal Grandmother   . Cancer Maternal Grandfather   . Diabetes Paternal Grandmother     SOCIAL HX:  reports that she quit smoking about 22 years ago. Her smoking use included cigarettes. She has a 1.00 pack-year smoking history. She has never used smokeless tobacco. She reports that she does not drink alcohol and does not use drugs.   Current Outpatient Medications:  .  amitriptyline (ELAVIL) 50 MG tablet, Take  1 tablet (50 mg total) by mouth at bedtime., Disp: 30 tablet, Rfl: 2 .  colestipol (COLESTID) 1 g tablet, Take by mouth., Disp: , Rfl:  .  ibuprofen (ADVIL,MOTRIN) 200 MG tablet, Take 600 mg by mouth every 6 (six) hours as needed for moderate pain., Disp: , Rfl:  .  pantoprazole (PROTONIX) 40 MG tablet, Take 40 mg by mouth daily., Disp: , Rfl:  .   tiZANidine (ZANAFLEX) 2 MG tablet, Take 2 mg by mouth 3 (three) times daily., Disp: , Rfl:  .  traMADol (ULTRAM) 50 MG tablet, Take 1 tablet (50 mg total) by mouth 2 (two) times daily as needed for up to 60 doses for moderate pain., Disp: 60 tablet, Rfl: 5 .  pseudoephedrine-codeine-guaifenesin (MYTUSSIN DAC) 30-10-100 MG/5ML solution, Take 10 mLs by mouth 4 (four) times daily as needed for cough., Disp: 120 mL, Rfl: 0  EXAM:  VITALS per patient if applicable:  GENERAL: alert, oriented, appears ill/miserable , but in no acute distress  HEENT: atraumatic, conjunttiva clear, no obvious abnormalities on inspection of external nose and ears  NECK: normal movements of the head and neck  LUNGS: on inspection no signs of respiratory distress, breathing rate appears normal, no obvious gross SOB, gasping or wheezing  CV: no obvious cyanosis  MS: moves all visible extremities without noticeable abnormality  PSYCH/NEURO: pleasant and cooperative, no obvious depression or anxiety, speech and thought processing grossly intact  ASSESSMENT AND PLAN:  Discussed the following assessment and plan:  Sore throat - Plan: POCT rapid strep A, POCT rapid strep A  Cough - Plan: POCT Influenza A/B, POCT rapid strep A, Novel Coronavirus, NAA ([facility]), Novel Coronavirus, NAA ([facility]), POCT rapid strep A, POCT Influenza A/B  Acute URI of multiple sites  Acute URI of multiple sites Strep, COVID and influenza tests negative by current examination . She is afebrile and has no signs of sinusitis or otitis either.   Will treat symptomatically with decongestants, cough supppressants. .    I discussed the assessment and treatment plan with the patient. The patient was provided an opportunity to ask questions and all were answered. The patient agreed with the plan and demonstrated an understanding of the instructions.   The patient was advised to call back or seek an in-person evaluation if the symptoms worsen or  if the condition fails to improve as anticipated.  I provided 20  minutes of face-to-face time during this encounter.   Crecencio Mc, MD

## 2020-10-05 LAB — SARS-COV-2, NAA 2 DAY TAT

## 2020-10-05 LAB — NOVEL CORONAVIRUS, NAA: SARS-CoV-2, NAA: NOT DETECTED

## 2020-10-05 NOTE — Progress Notes (Signed)
Your recent HgbA1c shows that your diabetes control is excellent.  Continue current treatment.

## 2020-10-05 NOTE — Progress Notes (Signed)
Your strep and flu tests were both negative (normal).  THE covid test is still pending  Regards,   Deborra Medina, MD

## 2020-10-06 DIAGNOSIS — J069 Acute upper respiratory infection, unspecified: Secondary | ICD-10-CM | POA: Insufficient documentation

## 2020-10-06 NOTE — Assessment & Plan Note (Signed)
Strep, COVID and influenza tests negative by current examination . She is afebrile and has no signs of sinusitis or otitis either.   Will treat symptomatically with decongestants, cough supppressants. Yolanda Young

## 2020-10-06 NOTE — Progress Notes (Signed)
   Your covid 19 test was finally reported and negative.  You should continue to protect yourself from future infection with social distancing and masking   Regards,   Deborra Medina, MD

## 2020-12-04 NOTE — Progress Notes (Deleted)
NEUROLOGY CONSULTATION NOTE  [name] MRN: [id number] DOB: 1966-04-22  Referring provider: Deborra Medina, MD Primary care provider: Deborra Medina, MD  Reason for consult:  Neuropathy, headaches   Subjective:  Yolanda Young. [place] is a 54 year old ***-handed female with migraines and chronic pain who presents for neuropathy and headaches.  History supplemented by referring provider's notes.  She has had headaches, diagnosed as migraines, ***  She has chronic pain involving the lower back and joints.  ***.  Labs earlier this year showed a positive ANA  1:320 nuclear homogeneous, elevated sed rate 51, CRP 1, ds-DNA negative, ENA negative, SSA/SSB abs negative, Anti-Jo negative, anti-Smith negative, B12 320, folate 723, TSH 2.17, T3 27, T4 8.9.   Cervical spine radiograph from March for neck pain personally reviewed revealed diffuse multilevel degenerative changes with mild straightening of the cervical spine.  She was subsequently diagnosed with fibromyalgia.    Current NSAIDS/analgesics:  Tramadol 50mg  BID PRN, ibuiprofen Current triptans:  none Current ergotamine:  none Current anti-emetic:  none Current muscle relaxants:  Tizanidine 2mg  TID Current Antihypertensive medications:  none Current Antidepressant medications:  Amitriptyline 50mg  at bedtime Current Anticonvulsant medications:  none Current anti-CGRP:  none Current Vitamins/Herbal/Supplements:  none Current Antihistamines/Decongestants:  none Other therapy:  *** Hormone/birth control:  none  Past NSAIDS/analgesics:  Fioricet Past abortive triptans:  *** Past abortive ergotamine:  none Past muscle relaxants:  none Past anti-emetic:  none Past antihypertensive medications:  HCTZ Past antidepressant medications:  Cymbalta Past anticonvulsant medications:  topiramate Past anti-CGRP:  none Past vitamins/Herbal/Supplements:  none Past antihistamines/decongestants:  none Other past therapies:  ***  Caffeine:  *** Alcohol:   *** Smoker:  *** Diet:  *** Exercise:  *** Depression:  ***; Anxiety:  *** Other pain:  *** Sleep hygiene:  *** Family history of headache:  ***      PAST MEDICAL HISTORY: Past Medical History:  Diagnosis Date  . Colon polyp 02/04/2018   tubular adenoma  . GERD (gastroesophageal reflux disease)   . History of hiatal hernia   . Migraine    weekly  . Panic attacks     PAST SURGICAL HISTORY: Past Surgical History:  Procedure Laterality Date  . ABLATION ON ENDOMETRIOSIS  2013   [place]  . CHOLECYSTECTOMY N/A 04/05/2018   Procedure: LAPAROSCOPIC CHOLECYSTECTOMY WITH INTRAOPERATIVE CHOLANGIOGRAM;  Surgeon: Robert Bellow, MD;  Location: [facility];  Service: General;  Laterality: N/A;  . COLONOSCOPY WITH PROPOFOL N/A 02/04/2018   Procedure: COLONOSCOPY WITH PROPOFOL;  Surgeon: Lucilla Lame, MD;  Location: [place];  Service: Endoscopy;  Laterality: N/A;  . ESOPHAGOGASTRODUODENOSCOPY (EGD) WITH PROPOFOL N/A 02/04/2018   Procedure: ESOPHAGOGASTRODUODENOSCOPY (EGD) WITH PROPOFOL;  Surgeon: Lucilla Lame, MD;  Location: [place];  Service: Endoscopy;  Laterality: N/A;  . ESOPHAGOGASTRODUODENOSCOPY (EGD) WITH PROPOFOL N/A 07/03/2020   Procedure: ESOPHAGOGASTRODUODENOSCOPY (EGD) WITH PROPOFOL;  Surgeon: Lesly Rubenstein, MD;  Location: [facility];  Service: Endoscopy;  Laterality: N/A;  . SHOULDER SURGERY Right     MEDICATIONS: Current Outpatient Medications on File Prior to Visit  Medication Sig Dispense Refill  . amitriptyline (ELAVIL) 50 MG tablet Take 1 tablet (50 mg total) by mouth at bedtime. 30 tablet 2  . colestipol (COLESTID) 1 g tablet Take by mouth.    Marland Kitchen ibuprofen (ADVIL,MOTRIN) 200 MG tablet Take 600 mg by mouth every 6 (six) hours as needed for moderate pain.    . pantoprazole (PROTONIX) 40 MG tablet Take 40 mg by mouth  daily.    . pseudoephedrine-codeine-guaifenesin (MYTUSSIN DAC) 30-10-100 MG/5ML solution Take 10 mLs by mouth 4 (four)  times daily as needed for cough. 120 mL 0  . tiZANidine (ZANAFLEX) 2 MG tablet Take 2 mg by mouth 3 (three) times daily.    . traMADol (ULTRAM) 50 MG tablet Take 1 tablet (50 mg total) by mouth 2 (two) times daily as needed for up to 60 doses for moderate pain. 60 tablet 5   No current facility-administered medications on file prior to visit.    ALLERGIES: Allergies  Allergen Reactions  . Azithromycin Rash  . Erythromycin Rash  . Penicillins Rash    Has patient had a PCN reaction causing immediate rash, facial/tongue/throat swelling, SOB or lightheadedness with hypotension: No Has patient had a PCN reaction causing severe rash involving mucus membranes or skin necrosis: Yes Has patient had a PCN reaction that required hospitalization: No Has patient had a PCN reaction occurring within the last 10 years: No If all of the above answers are "NO", then may proceed with Cephalosporin use.   . Prednisone Nausea And Vomiting    FAMILY HISTORY: Family History  Problem Relation Age of Onset  . Diabetes Mother   . Stroke Father   . Crohn's disease Father   . Cancer Maternal Grandmother   . Cancer Maternal Grandfather   . Diabetes Paternal Grandmother    ***.  SOCIAL HISTORY: Social History   Socioeconomic History  . Marital status: Divorced    Spouse name: Not on file  . Number of children: Not on file  . Years of education: Not on file  . Highest education level: Not on file  Occupational History  . Not on file  Tobacco Use  . Smoking status: Former Smoker    Packs/day: 0.50    Years: 2.00    Pack years: 1.00    Types: Cigarettes    Quit date: 1999    Years since quitting: 23.0  . Smokeless tobacco: Never Used  Vaping Use  . Vaping Use: Never used  Substance and Sexual Activity  . Alcohol use: Never    Alcohol/week: 4.0 standard drinks    Types: 4 Glasses of wine per week  . Drug use: No  . Sexual activity: Yes  Other Topics Concern  . Not on file  Social  History Narrative   Lives at home alone   Social Determinants of Health   Financial Resource Strain: Not on file  Food Insecurity: Not on file  Transportation Needs: Not on file  Physical Activity: Not on file  Stress: Not on file  Social Connections: Not on file  Intimate Partner Violence: Not on file    Objective:  *** General: No acute distress.  Patient appears well-groomed.   Head:  Normocephalic/atraumatic Eyes:  fundi examined but not visualized Neck: supple, no paraspinal tenderness, full range of motion Back: No paraspinal tenderness Heart: regular rate and rhythm Lungs: Clear to auscultation bilaterally. Vascular: No carotid bruits. Neurological Exam: Mental status: alert and oriented to person, place, and time, recent and remote memory intact, fund of knowledge intact, attention and concentration intact, speech fluent and not dysarthric, language intact. Cranial nerves: CN I: not tested CN II: pupils equal, round and reactive to light, visual fields intact CN III, IV, VI:  full range of motion, no nystagmus, no ptosis CN V: facial sensation intact. CN VII: upper and lower face symmetric CN VIII: hearing intact CN IX, X: gag intact, uvula midline CN XI: sternocleidomastoid  and trapezius muscles intact CN XII: tongue midline Bulk & Tone: normal, no fasciculations. Motor:  muscle strength 5/5 throughout Sensation:  Pinprick, temperature and vibratory sensation intact. Deep Tendon Reflexes:  2+ throughout,  toes downgoing.   Finger to nose testing:  Without dysmetria.   Heel to shin:  Without dysmetria.   Gait:  Normal station and stride.  Romberg negative.  Assessment/Plan:   ***    Thank you for allowing me to take part in the care of this patient.  Shon Millet, DO  CC: ***

## 2020-12-06 ENCOUNTER — Ambulatory Visit: Payer: 59 | Admitting: Neurology

## 2020-12-06 ENCOUNTER — Telehealth: Payer: Self-pay | Admitting: Internal Medicine

## 2020-12-06 NOTE — Telephone Encounter (Signed)
Pt called she is having a bad cough, headache, fever Pt said that she would go to an UC to be seen and get tested

## 2020-12-06 NOTE — Telephone Encounter (Signed)
FYI

## 2020-12-10 ENCOUNTER — Ambulatory Visit
Admission: RE | Admit: 2020-12-10 | Discharge: 2020-12-10 | Disposition: A | Discharge status: Home / Self Care | Payer: 59 | Source: Ambulatory Visit | Attending: Sports Medicine | Admitting: Sports Medicine

## 2020-12-10 ENCOUNTER — Other Ambulatory Visit: Payer: Self-pay

## 2020-12-10 ENCOUNTER — Ambulatory Visit (INDEPENDENT_AMBULATORY_CARE_PROVIDER_SITE_OTHER): Payer: 59

## 2020-12-10 VITALS — BP 156/94 | HR 126 | Temp 98.4°F | Resp 20 | Ht 68.0 in | Wt 223.1 lb

## 2020-12-10 DIAGNOSIS — R062 Wheezing: Secondary | ICD-10-CM

## 2020-12-10 DIAGNOSIS — J069 Acute upper respiratory infection, unspecified: Secondary | ICD-10-CM | POA: Diagnosis not present

## 2020-12-10 DIAGNOSIS — R509 Fever, unspecified: Secondary | ICD-10-CM | POA: Diagnosis not present

## 2020-12-10 DIAGNOSIS — R0602 Shortness of breath: Secondary | ICD-10-CM | POA: Diagnosis not present

## 2020-12-10 DIAGNOSIS — R0989 Other specified symptoms and signs involving the circulatory and respiratory systems: Secondary | ICD-10-CM | POA: Diagnosis not present

## 2020-12-10 DIAGNOSIS — R059 Cough, unspecified: Secondary | ICD-10-CM

## 2020-12-10 IMAGING — CR DG CHEST 2V
2 series · 2 of 2 positions shown · non-contrast
Comparison: [DATE]

CLINICAL DATA: Pt c/o cough, shortness of breath, fever. Started a
week ago today. She states she had a PCR covid test about 4 days ago
and was negative

EXAM:
CHEST - 2 VIEW

[chest pa]
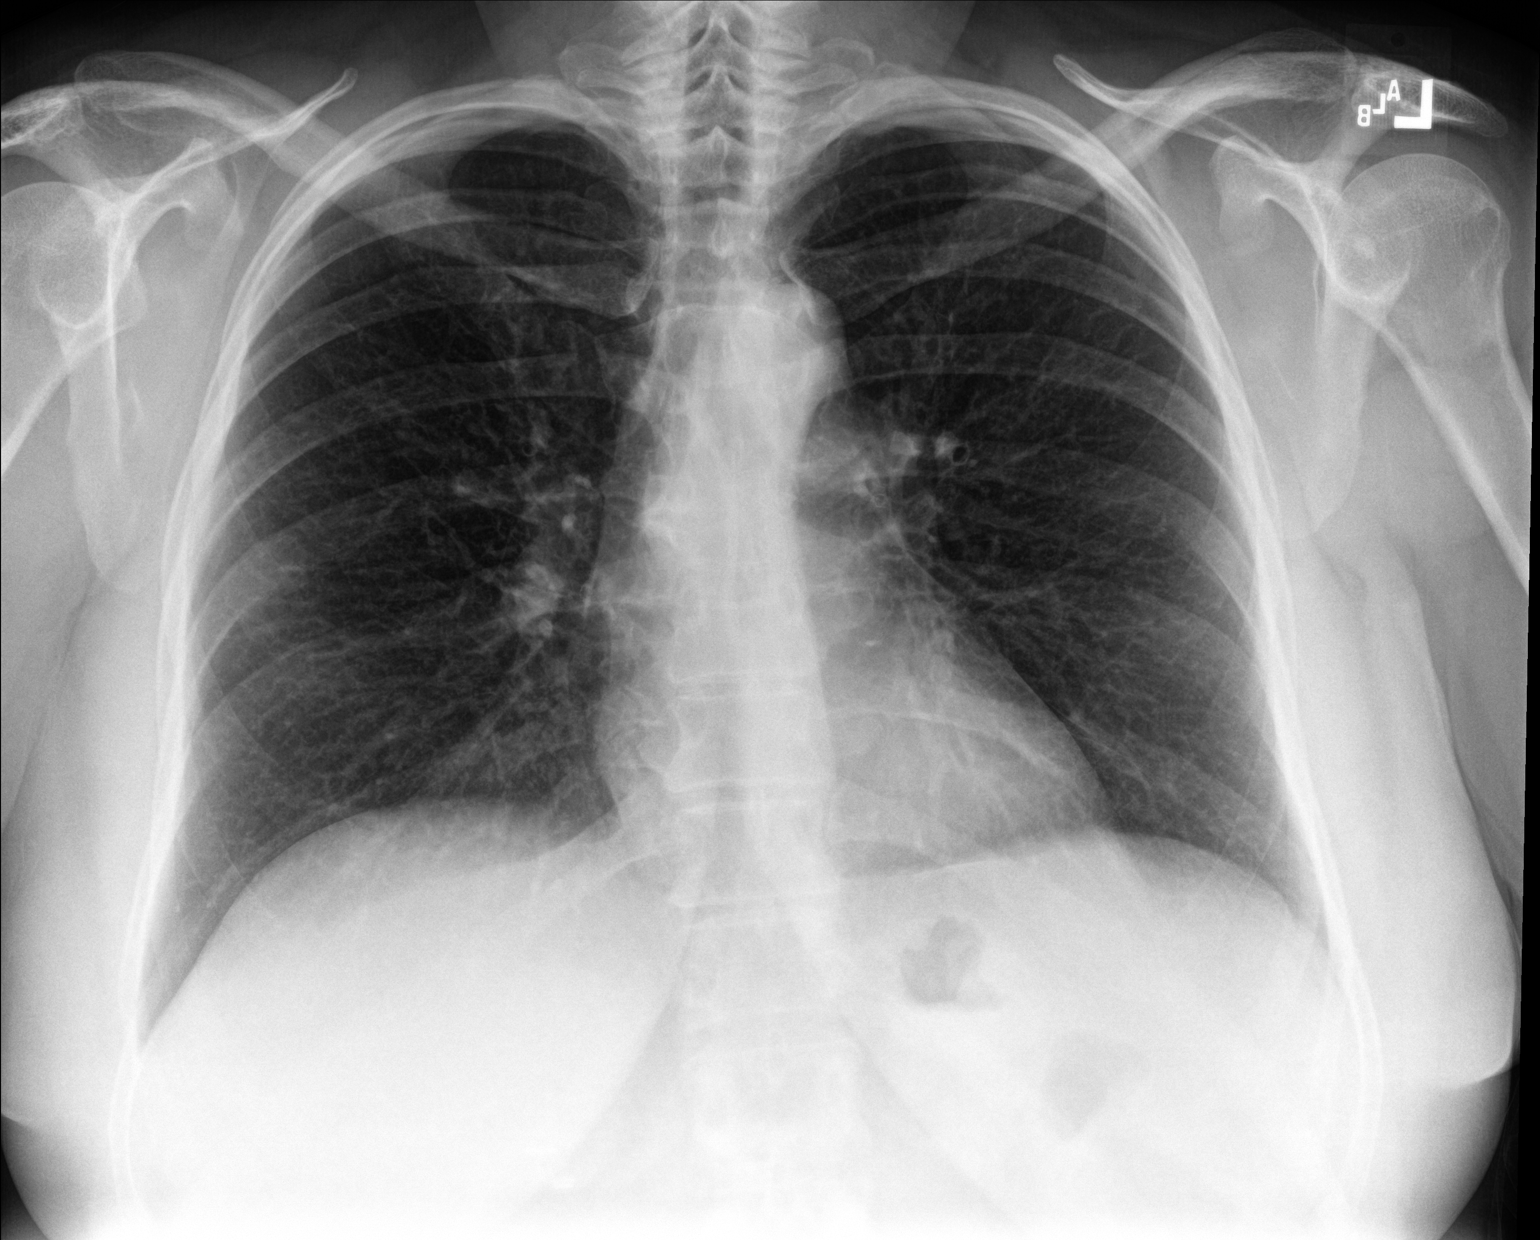

[chest lat]
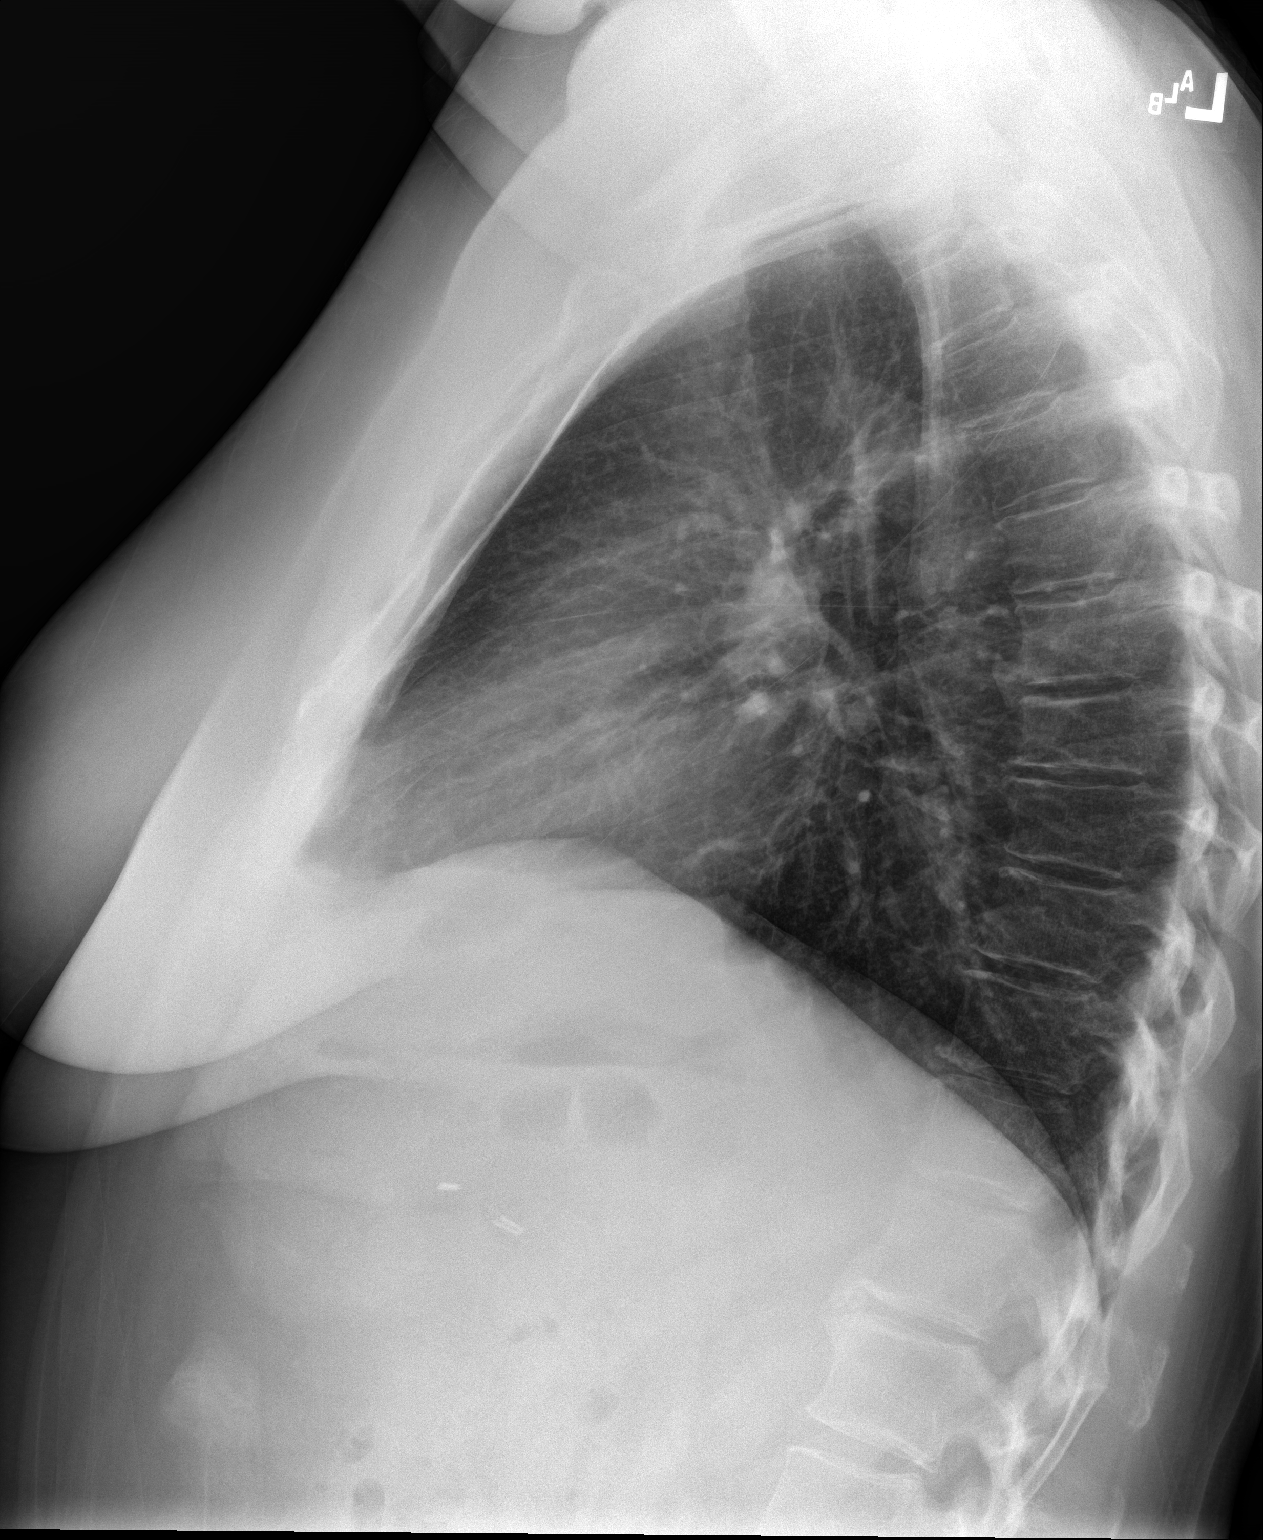

[2 of 2 positions shown; findings below may reference images not displayed]

FINDINGS: The heart size and mediastinal contours are within normal limits.
Both lungs are clear. No pleural effusion or pneumothorax. The
visualized skeletal structures are unremarkable.
IMPRESSION: No active cardiopulmonary disease.

## 2020-12-10 MED ORDER — SULFAMETHOXAZOLE-TRIMETHOPRIM 800-160 MG PO TABS: 1.0000 | ORAL_TABLET | Freq: Two times a day (BID) | ORAL | 0 refills | Status: AC

## 2020-12-10 MED ORDER — ALBUTEROL SULFATE HFA 108 (90 BASE) MCG/ACT IN AERS: 2.0000 | INHALATION_SPRAY | RESPIRATORY_TRACT | 0 refills | Status: DC | PRN

## 2020-12-10 NOTE — ED Provider Notes (Signed)
[facility]    CSN: [id number] Arrival date & time: 12/10/20  1251      History   Chief Complaint Chief Complaint  Patient presents with  . Appointment  . Cough    HPI Yolanda Young is a 55 y.o. female.   Pleasant 55 year old female who presents for evaluation of cough and sore throat for 1 week.  She also reports some chest congestion.  No documented fever.  No shakes or chills.  She did have a Covid test done 4 days ago that was negative.  She is now more concerned because her cough and congestion is worsening.  She reports some shortness of breath especially with exertion.  She has cough to the point where she has had an episode of emesis.  She also reports some nausea.  No documented sick contacts.  She has been vaccinated against Covid x2.  She does not have the influenza vaccine on board.  She stays at home and does not work outside of the home.  No other issues or problems are offered.     Past Medical History:  Diagnosis Date  . Colon polyp 02/04/2018   tubular adenoma  . GERD (gastroesophageal reflux disease)   . History of hiatal hernia   . Migraine    weekly  . Panic attacks     Patient Active Problem List   Diagnosis Date Noted  . Acute URI of multiple sites 10/06/2020  . Barrett's esophagus without dysplasia 07/06/2020  . IBS (irritable bowel syndrome) 07/06/2020  . Back pain at L4-L5 level 04/03/2020  . Hepatic steatosis 04/03/2020  . Neuropathy 03/22/2020  . Polyarthritis involving elbow 03/06/2020  . Choking episode occurring at night 03/06/2020  . Excessive body weight gain 03/06/2020  . Amenorrhea, secondary 03/06/2020  . Shortness of breath on exertion 03/06/2020  . Neck pain of over 3 months duration 03/06/2020  . Personal history of colonic polyps   . Colon cancer screening   . Polyp of sigmoid colon   . Benign neoplasm of descending colon   . Gastritis without bleeding     Past Surgical History:  Procedure Laterality Date  .  ABLATION ON ENDOMETRIOSIS  2013   [place]  . CHOLECYSTECTOMY N/A 04/05/2018   Procedure: LAPAROSCOPIC CHOLECYSTECTOMY WITH INTRAOPERATIVE CHOLANGIOGRAM;  Surgeon: Robert Bellow, MD;  Location: [facility];  Service: General;  Laterality: N/A;  . COLONOSCOPY WITH PROPOFOL N/A 02/04/2018   Procedure: COLONOSCOPY WITH PROPOFOL;  Surgeon: Lucilla Lame, MD;  Location: [place];  Service: Endoscopy;  Laterality: N/A;  . ESOPHAGOGASTRODUODENOSCOPY (EGD) WITH PROPOFOL N/A 02/04/2018   Procedure: ESOPHAGOGASTRODUODENOSCOPY (EGD) WITH PROPOFOL;  Surgeon: Lucilla Lame, MD;  Location: [place];  Service: Endoscopy;  Laterality: N/A;  . ESOPHAGOGASTRODUODENOSCOPY (EGD) WITH PROPOFOL N/A 07/03/2020   Procedure: ESOPHAGOGASTRODUODENOSCOPY (EGD) WITH PROPOFOL;  Surgeon: Lesly Rubenstein, MD;  Location: [facility];  Service: Endoscopy;  Laterality: N/A;  . SHOULDER SURGERY Right     OB History    Gravida  4   Para      Term      Preterm      AB  4   Living  0     SAB  4   IAB      Ectopic      Multiple      Live Births           Obstetric Comments  1st Menstrual Cycle:  12  1st Pregnancy:  17  Home Medications    Prior to Admission medications   Medication Sig Start Date End Date Taking? Authorizing Provider  albuterol (VENTOLIN HFA) 108 (90 Base) MCG/ACT inhaler Inhale 2 puffs into the lungs every 4 (four) hours as needed for wheezing or shortness of breath. 12/10/20  Yes Delton See, MD  amitriptyline (ELAVIL) 50 MG tablet Take 1 tablet (50 mg total) by mouth at bedtime. 10/04/20  Yes [name], MD  colestipol (COLESTID) 1 g tablet Take by mouth. 08/07/20 08/07/21 Yes [provider]  ibuprofen (ADVIL,MOTRIN) 200 MG tablet Take 600 mg by mouth every 6 (six) hours as needed for moderate pain.   Yes [provider]  pantoprazole (PROTONIX) 40 MG tablet Take 40 mg by mouth daily. 07/31/20  Yes [provider]   sulfamethoxazole-trimethoprim (BACTRIM DS) 800-160 MG tablet Take 1 tablet by mouth 2 (two) times daily for 7 days. 12/10/20 12/17/20 Yes Delton See, MD  tiZANidine (ZANAFLEX) 2 MG tablet Take 2 mg by mouth 3 (three) times daily. 03/28/20  Yes [provider]  traMADol (ULTRAM) 50 MG tablet Take 1 tablet (50 mg total) by mouth 2 (two) times daily as needed for up to 60 doses for moderate pain. 07/04/20  Yes [name], MD  pseudoephedrine-codeine-guaifenesin (MYTUSSIN DAC) 30-10-100 MG/5ML solution Take 10 mLs by mouth 4 (four) times daily as needed for cough. 10/04/20   [name], MD    Family History Family History  Problem Relation Age of Onset  . Diabetes Mother   . Stroke Father   . Crohn's disease Father   . Cancer Maternal Grandmother   . Cancer Maternal Grandfather   . Diabetes Paternal Grandmother     Social History Social History   Tobacco Use  . Smoking status: Former Smoker    Packs/day: 0.50    Years: 2.00    Pack years: 1.00    Types: Cigarettes    Quit date: 1999    Years since quitting: 23.0  . Smokeless tobacco: Never Used  Vaping Use  . Vaping Use: Never used  Substance Use Topics  . Alcohol use: Never    Alcohol/week: 4.0 standard drinks    Types: 4 Glasses of wine per week  . Drug use: No     Allergies   Azithromycin, Erythromycin, Penicillins, and Prednisone   Review of Systems Review of Systems  Constitutional: Positive for chills and fever. Negative for appetite change, diaphoresis and fatigue.  HENT: Positive for congestion, sinus pressure and sore throat. Negative for ear pain, rhinorrhea and sinus pain.   Eyes: Negative for pain.  Respiratory: Positive for cough, chest tightness and shortness of breath. Negative for apnea, choking, wheezing and stridor.   Cardiovascular: Negative for chest pain and palpitations.  Gastrointestinal: Positive for nausea and vomiting. Negative for abdominal pain and diarrhea.   Genitourinary: Negative for dysuria.  Musculoskeletal: Negative for back pain and neck pain.  Skin: Negative for color change, pallor, rash and wound.  Neurological: Negative for dizziness, syncope, weakness, light-headedness, numbness and headaches.  All other systems reviewed and are negative.    Physical Exam Triage Vital Signs ED Triage Vitals  Enc Vitals Group     BP 12/10/20 1332 (!) 156/94     Pulse Rate 12/10/20 1332 (!) 126     Resp 12/10/20 1332 20     Temp 12/10/20 1332 98.4 F (36.9 C)     Temp Source 12/10/20 1332 Oral     SpO2 12/10/20 1332 99 %  Weight 12/10/20 1330 223 lb 1.7 oz (101.2 kg)     Height 12/10/20 1330 5\' 8"  (1.727 m)     Head Circumference --      Peak Flow --      Pain Score 12/10/20 1329 5     Pain Loc --      Pain Edu? --      Excl. in [place]? --    No data found.  Updated Vital Signs BP (!) 156/94 (BP Location: Right Arm)   Pulse (!) 126   Temp 98.4 F (36.9 C) (Oral)   Resp 20   Ht 5\' 8"  (1.727 m)   Wt 101.2 kg   SpO2 99%   BMI 33.92 kg/m   Visual Acuity Right Eye Distance:   Left Eye Distance:   Bilateral Distance:    Right Eye Near:   Left Eye Near:    Bilateral Near:     Physical Exam Vitals and nursing note reviewed.  Constitutional:      General: She is not in acute distress.    Appearance: Normal appearance. She is not ill-appearing or toxic-appearing.  HENT:     Head: Normocephalic and atraumatic.     Right Ear: Tympanic membrane normal.     Left Ear: Tympanic membrane normal.     Nose: Congestion and rhinorrhea present.     Mouth/Throat:     Mouth: Mucous membranes are moist.     Pharynx: Posterior oropharyngeal erythema present. No oropharyngeal exudate.  Eyes:     Extraocular Movements: Extraocular movements intact.     Conjunctiva/sclera: Conjunctivae normal.     Pupils: Pupils are equal, round, and reactive to light.  Cardiovascular:     Rate and Rhythm: Regular rhythm. Tachycardia present.      Pulses: Normal pulses.     Heart sounds: Normal heart sounds. No murmur heard. No friction rub. No gallop.   Pulmonary:     Effort: No respiratory distress.     Breath sounds: No stridor. Wheezing present. No rhonchi or rales.  Musculoskeletal:     Cervical back: Normal range of motion and neck supple. No rigidity.  Lymphadenopathy:     Cervical: No cervical adenopathy.  Skin:    General: Skin is warm and dry.     Capillary Refill: Capillary refill takes less than 2 seconds.  Neurological:     General: No focal deficit present.     Mental Status: She is alert and oriented to person, place, and time.      UC Treatments / Results  Labs (all labs ordered are listed, but only abnormal results are displayed) Labs Reviewed - No data to display  EKG   Radiology DG Chest 2 View  Result Date: 12/10/2020 CLINICAL DATA:  Pt c/o cough, shortness of breath, fever. Started a week ago today. She states she had a PCR covid test about 4 days ago and was negative EXAM: CHEST - 2 VIEW COMPARISON:  03/05/2020 FINDINGS: The heart size and mediastinal contours are within normal limits. Both lungs are clear. No pleural effusion or pneumothorax. The visualized skeletal structures are unremarkable. IMPRESSION: No active cardiopulmonary disease. Electronically Signed   By: Lajean Manes M.D.   On: 12/10/2020 14:45    Procedures Procedures (including critical care time)  Medications Ordered in UC Medications - No data to display  Initial Impression / Assessment and Plan / UC Course  I have reviewed the triage vital signs and the nursing notes.  Pertinent labs & imaging  results that were available during my care of the patient were reviewed by me and considered in my medical decision making (see chart for details).  Clinical impression: Pleasant 55 year old female who presents with 1 week of URI symptoms.  Exam as above.  She had Covid test was negative 4 days ago.  She is now having worsening  shortness of breath and cough to the point where she is nauseous and has had some emesis.  Treatment plan: 1.  The findings and treatment plan were discussed in detail with the patient.  Patient was in agreement. 2.  Recommend getting a chest x-ray.  Results are above.  No active cardiopulmonary disease was noted. 3.  Given the length of time she has had her symptoms I felt that was certainly prudent to go ahead and give her an antibiotic at this time.  Unfortunately she has allergies to azithromycin and penicillins.  I will go ahead and give her Bactrim double strength twice daily #14. #4 go ahead and give her an albuterol inhaler that she will use and hopefully that will help with the wheezing and open her up and allow her to cough up some of the mucus. 5.  Recommended Mucinex 1200 mg twice daily over-the-counter. 6.  Over-the-counter meds as needed, Tylenol or Motrin for fever discomfort.  Discussed the red flag signs and symptoms and when to call 911 and seek out immediate medical attention at a higher level of care including emergency room.  She voiced verbal understanding. 7.  If her symptoms persist despite the antibiotics then she may need to be tested for Covid again.  Certainly she is having worsening respiratory issues she would be a good candidate for monoclonal antibody treatment if she her subsequent Covid test came back positive. 8.  She will follow up with [place] as needed.     Final Clinical Impressions(s) / UC Diagnoses   Final diagnoses:  Viral URI with cough  Cough  Chest congestion  Wheeze     Discharge Instructions     See attached instructions    ED Prescriptions    Medication Sig Dispense Auth. Provider   sulfamethoxazole-trimethoprim (BACTRIM DS) 800-160 MG tablet Take 1 tablet by mouth 2 (two) times daily for 7 days. 14 tablet Verda Cumins, MD   albuterol (VENTOLIN HFA) 108 (90 Base) MCG/ACT inhaler Inhale 2 puffs into the lungs every 4 (four) hours as needed  for wheezing or shortness of breath. 1 each Verda Cumins, MD     PDMP not reviewed this encounter.   Verda Cumins, MD 12/11/20 [phone]

## 2020-12-10 NOTE — Discharge Instructions (Addendum)
See attached instructions.

## 2020-12-10 NOTE — ED Triage Notes (Signed)
Pt c/o cough, shortness of breath, fever. Started a week ago today. She states she had a PCR covid test about 4 days ago and was negative.

## 2021-01-31 NOTE — Progress Notes (Addendum)
NEUROLOGY CONSULTATION NOTE  Yolanda Young MRN: [id number] DOB: 1966-06-22  Referring provider: Deborra Medina, MD Primary care provider: Deborra Medina, MD  Reason for consult:  Headache, neuropathy  Assessment/Plan:   1.  Chronic migraines/migraine with aura 2.  Paresthesias  1.  NCV-EMG right upper and left lower extremities 2.  Migraine prevention:  Ajovy every 28 days  02/05/2021 ADDENDUM - due to insurance preference, will start Aimovig 140mg  every 28 days instead. 3.  Migraine rescue:  Sumatriptan 100mg /Zofran ODT 4mg  4.  Limit use of pain relievers to no more than 2 days out of week to prevent risk of rebound or medication-overuse headache. 5.  Keep headache diary 6.  Follow up 4 to 6 months.   Subjective:  Yolanda Young is a 55 year old right-handed female with hepatic steatosis and fibromyalgia who presents for headache and neuropathy.  History supplemented by rheumatology and referring provider's notes.  She reports headaches since childhood.  She has visual aura of splotchy and tunnel vision and left sided facial tingling followed by severe left sided pounding headache.  Nausea, vomiting, photophobia, phonophobia, osmophobia, and generalized weakness.  They severe ones last 2 days followed by fatigue for a couple of days afterwards.  Moderate headaches last a day (ocular migraines with mild headache).  Migraines occur 20-24 days a month, 15 are severe.  Triggers include diet soft drinks, stress, bright lights.  Resting in a dark room helps.  She has neck pain.  Cervical X-ray from 03/05/2020 personally reviewed showed diffuse multilevel degenerative changes with mild straightening of the cervical spine.  Currently not treating    She reports constant pins and needles sensation in the arms and legs since 2008.  She has low back pain radiating down the left posterior leg.  Thyroid panel was normal, B12 350 and Hgb A1c 5.9.  She underwent autoimmune workup.  She had a positive ANA  1/320 homo and sed rate 51, but ENA, RF, C3, C4, CCP antibodies, anticardiolipin IgG, lupus anticoagulant,  beta-2 glycoprotein and UPEP were normal, indeterminate ACL IgM of 17 and polyclonal immunoglobulin.  CK 55.  X-ray of bilateral hips normal.  X-ray of lumbar spine showed anterior osteophyte at T12-L1 and normal SI joints.  She was seen by rheumatology who diagnosed fibromyalgia.    Current NSAIDS/analgesics:  Tramadol, ibuprofen  Current triptans:  none Current ergotamine:  none Current anti-emetic:  none Current muscle relaxants:  Tizanidine 2mg  TID Current Antihypertensive medications:  none Current Antidepressant medications:  Amitriptyline 50mg  at bedtime Current Anticonvulsant medications:  none Current anti-CGRP:  none Current Vitamins/Herbal/Supplements:  Melatonin 10-20mg  at night Current Antihistamines/Decongestants:  noen Other therapy:  none Hormone/birth control:  none   Past NSAIDS/analgesics:  Meloxicam, Excedrin, Fioricet Past abortive triptans:  Maxalt Past abortive ergotamine:  none Past muscle relaxants:  none Past anti-emetic:  none Past antihypertensive medications:  none Past antidepressant medications:  none Past anticonvulsant medications:  topiramate Past anti-CGRP:  none Past vitamins/Herbal/Supplements:  none Past antihistamines/decongestants:  none Other past therapies:  none  Caffeine:  1 cup of coffee a day (down from 4 cups) Diet:  Not hydrates enough.  Skips meals Exercise:  Not recently Depression:  no; Anxiety:  Yes.  Panic attacks Other pain:  fibromyalgia Sleep:  Poor.  Insomnia Family history of headache:  Both grandmothers and aunts    PAST MEDICAL HISTORY: Past Medical History:  Diagnosis Date  . Colon polyp 02/04/2018   tubular adenoma  . GERD (gastroesophageal reflux disease)   .  History of hiatal hernia   . Migraine    weekly  . Panic attacks     PAST SURGICAL HISTORY: Past Surgical History:  Procedure Laterality  Date  . ABLATION ON ENDOMETRIOSIS  2013   [place]  . CHOLECYSTECTOMY N/A 04/05/2018   Procedure: LAPAROSCOPIC CHOLECYSTECTOMY WITH INTRAOPERATIVE CHOLANGIOGRAM;  Surgeon: Robert Bellow, MD;  Location: [facility];  Service: General;  Laterality: N/A;  . COLONOSCOPY WITH PROPOFOL N/A 02/04/2018   Procedure: COLONOSCOPY WITH PROPOFOL;  Surgeon: Lucilla Lame, MD;  Location: [place];  Service: Endoscopy;  Laterality: N/A;  . ESOPHAGOGASTRODUODENOSCOPY (EGD) WITH PROPOFOL N/A 02/04/2018   Procedure: ESOPHAGOGASTRODUODENOSCOPY (EGD) WITH PROPOFOL;  Surgeon: Lucilla Lame, MD;  Location: [place];  Service: Endoscopy;  Laterality: N/A;  . ESOPHAGOGASTRODUODENOSCOPY (EGD) WITH PROPOFOL N/A 07/03/2020   Procedure: ESOPHAGOGASTRODUODENOSCOPY (EGD) WITH PROPOFOL;  Surgeon: Lesly Rubenstein, MD;  Location: [facility];  Service: Endoscopy;  Laterality: N/A;  . SHOULDER SURGERY Right     MEDICATIONS: Current Outpatient Medications on File Prior to Visit  Medication Sig Dispense Refill  . albuterol (VENTOLIN HFA) 108 (90 Base) MCG/ACT inhaler Inhale 2 puffs into the lungs every 4 (four) hours as needed for wheezing or shortness of breath. 1 each 0  . amitriptyline (ELAVIL) 50 MG tablet Take 1 tablet (50 mg total) by mouth at bedtime. 30 tablet 2  . colestipol (COLESTID) 1 g tablet Take by mouth.    Marland Kitchen ibuprofen (ADVIL,MOTRIN) 200 MG tablet Take 600 mg by mouth every 6 (six) hours as needed for moderate pain.    . pantoprazole (PROTONIX) 40 MG tablet Take 40 mg by mouth daily.    . pseudoephedrine-codeine-guaifenesin (MYTUSSIN DAC) 30-10-100 MG/5ML solution Take 10 mLs by mouth 4 (four) times daily as needed for cough. 120 mL 0  . tiZANidine (ZANAFLEX) 2 MG tablet Take 2 mg by mouth 3 (three) times daily.    . traMADol (ULTRAM) 50 MG tablet Take 1 tablet (50 mg total) by mouth 2 (two) times daily as needed for up to 60 doses for moderate pain. 60 tablet 5   No current  facility-administered medications on file prior to visit.    ALLERGIES: Allergies  Allergen Reactions  . Azithromycin Rash  . Erythromycin Rash  . Penicillins Rash    Has patient had a PCN reaction causing immediate rash, facial/tongue/throat swelling, SOB or lightheadedness with hypotension: No Has patient had a PCN reaction causing severe rash involving mucus membranes or skin necrosis: Yes Has patient had a PCN reaction that required hospitalization: No Has patient had a PCN reaction occurring within the last 10 years: No If all of the above answers are "NO", then may proceed with Cephalosporin use.   . Prednisone Nausea And Vomiting    FAMILY HISTORY: Family History  Problem Relation Age of Onset  . Diabetes Mother   . Stroke Father   . Crohn's disease Father   . Cancer Maternal Grandmother   . Cancer Maternal Grandfather   . Diabetes Paternal Grandmother     Objective:  Blood pressure 124/89, pulse (!) 135, height 5\' 8"  (1.727 m), weight 241 lb 6.4 oz (109.5 kg), SpO2 96 %. General: No acute distress.  Patient appears well-groomed.   Head:  Normocephalic/atraumatic Eyes:  fundi examined but not visualized Neck: supple, no paraspinal tenderness, full range of motion Back: No paraspinal tenderness Heart: regular rate and rhythm Lungs: Clear to auscultation bilaterally. Vascular: No carotid bruits. Neurological Exam: Mental status: alert and oriented to  person, place, and time, recent and remote memory intact, fund of knowledge intact, attention and concentration intact, speech fluent and not dysarthric, language intact. Cranial nerves: CN I: not tested CN II: pupils equal, round and reactive to light, visual fields intact CN III, IV, VI:  full range of motion, no nystagmus, no ptosis CN V: facial sensation intact. CN VII: upper and lower face symmetric CN VIII: hearing intact CN IX, X: gag intact, uvula midline CN XI: sternocleidomastoid and trapezius muscles  intact CN XII: tongue midline Bulk & Tone: normal, no fasciculations. Motor:  muscle strength 5/5 throughout Sensation:  Pinprick, temperature and vibratory sensation intact. Deep Tendon Reflexes:  2+ throughout,  toes downgoing.   Finger to nose testing:  Without dysmetria.   Heel to shin:  Without dysmetria.   Gait:  Normal station and stride.  Romberg negative.    Thank you for allowing me to take part in the care of this patient.  Metta Clines, DO  CC:  Deborra Medina, MD

## 2021-02-01 ENCOUNTER — Ambulatory Visit (INDEPENDENT_AMBULATORY_CARE_PROVIDER_SITE_OTHER): Payer: 59 | Admitting: Neurology

## 2021-02-01 ENCOUNTER — Encounter: Payer: Self-pay | Admitting: Neurology

## 2021-02-01 ENCOUNTER — Other Ambulatory Visit: Payer: Self-pay

## 2021-02-01 VITALS — BP 124/89 | HR 135 | Ht 68.0 in | Wt 241.4 lb

## 2021-02-01 DIAGNOSIS — M79605 Pain in left leg: Secondary | ICD-10-CM | POA: Diagnosis not present

## 2021-02-01 DIAGNOSIS — G43709 Chronic migraine without aura, not intractable, without status migrainosus: Secondary | ICD-10-CM | POA: Diagnosis not present

## 2021-02-01 DIAGNOSIS — R202 Paresthesia of skin: Secondary | ICD-10-CM

## 2021-02-01 DIAGNOSIS — G43109 Migraine with aura, not intractable, without status migrainosus: Secondary | ICD-10-CM | POA: Diagnosis not present

## 2021-02-01 DIAGNOSIS — M79601 Pain in right arm: Secondary | ICD-10-CM

## 2021-02-01 MED ORDER — ONDANSETRON 4 MG PO TBDP: 4.0000 mg | ORAL_TABLET | Freq: Three times a day (TID) | ORAL | 5 refills | Status: AC | PRN

## 2021-02-01 MED ORDER — SUMATRIPTAN SUCCINATE 100 MG PO TABS: ORAL_TABLET | ORAL | 5 refills | Status: DC

## 2021-02-01 MED ORDER — AJOVY 225 MG/1.5ML ~~LOC~~ SOAJ: 225.0000 mg | SUBCUTANEOUS | 5 refills | Status: DC

## 2021-02-01 NOTE — Patient Instructions (Signed)
  1. Nerve study of right arm and left leg. 2. Plan to start Ajovy every 28 days 3. Take sumatriptan at earliest onset of headache.  May repeat dose once in 2 hours if needed.  Maximum 2 tablets in 24 hours. 4. Ondansetron for nausea 5. Limit use of pain relievers to no more than 2 days out of the week.  These medications include acetaminophen, NSAIDs (ibuprofen/Advil/Motrin, naproxen/Aleve, triptans (Imitrex/sumatriptan), Excedrin, and narcotics.  This will help reduce risk of rebound headaches. 6. Be aware of common food triggers:  - Caffeine:  coffee, black tea, cola, Mt. Dew  - Chocolate  - Dairy:  aged cheeses (brie, blue, cheddar, gouda, [place], provolone, [place], Swiss, etc), chocolate milk, buttermilk, sour cream, limit eggs and yogurt  - Nuts, peanut butter  - Alcohol  - Cereals/grains:  FRESH breads (fresh bagels, sourdough, doughnuts), yeast productions  - Processed/canned/aged/cured meats (pre-packaged deli meats, hotdogs)  - MSG/glutamate:  soy sauce, flavor enhancer, pickled/preserved/marinated foods  - Sweeteners:  aspartame (Equal, Nutrasweet).  Sugar and Splenda are okay  - Vegetables:  legumes (lima beans, lentils, snow peas, fava beans, pinto peans, peas, garbanzo beans), sauerkraut, onions, olives, pickles  - Fruit:  avocados, bananas, citrus fruit (orange, lemon, grapefruit), mango  - Other:  Frozen meals, macaroni and cheese 7. Routine exercise 8. Stay adequately hydrated (aim for 64 oz water daily) 9. Keep headache diary 10. Maintain proper stress management 11. Maintain proper sleep hygiene 12. Do not skip meals 13. Consider supplements:  magnesium citrate 400mg  daily, riboflavin 400mg  daily, coenzyme Q10 100mg  three times daily. 14. Follow up 4 to 6 months

## 2021-02-05 ENCOUNTER — Other Ambulatory Visit: Payer: Self-pay | Admitting: Neurology

## 2021-02-05 ENCOUNTER — Telehealth: Payer: Self-pay

## 2021-02-05 ENCOUNTER — Encounter: Payer: Self-pay | Admitting: Neurology

## 2021-02-05 MED ORDER — AIMOVIG 140 MG/ML ~~LOC~~ SOAJ: 140.0000 mg | SUBCUTANEOUS | 5 refills | Status: DC

## 2021-02-05 NOTE — Telephone Encounter (Signed)
Left a detailed a message for pt of switch of medications.

## 2021-02-05 NOTE — Progress Notes (Signed)
Yolanda Young (Key: [id number]) Rx #: [id number] Aimovig 140MG /ML auto-injectors   Form MedImpact ePA Form 2017 NCPDP Created 10 minutes ago Sent to Plan 3 minutes ago Plan Response 2 minutes ago Submit Clinical Questions 1 minute ago Determination Favorable 1 minute ago Message from Plan The request has been approved. The authorization is effective for a maximum of 6 fills from 02/05/2021 to 08/07/2021, as long as the member is enrolled in their current health plan. This has been approved for a quantity limit of 1.0 with a day supply limit of 30.0. A written notification letter will follow with additional details.

## 2021-02-05 NOTE — Telephone Encounter (Signed)
-----   Message from Pieter Partridge, DO sent at 02/05/2021  8:30 AM EST ----- Regarding: RE: Ajovy denial I placed a script for Aimovig.  Janyah Singleterry, could you let the patient know about the change?  Thanks ----- Message ----- From: Lurene Shadow Sent: 02/05/2021   8:01 AM EST To: Pieter Partridge, DO, Venetia Night, CMA Subject: Arie Sabina denial                                   Good morning,  This is a first- her insurance is denying the Ajovy because she has not failed the preferred drugs of Aimovig or Emgality. Can she switch to one of those? If not, I can try for appeal but patient would need to come and sign appeal form. So just let me know.   Thanks!  Chelsea

## 2021-03-19 ENCOUNTER — Other Ambulatory Visit: Payer: Self-pay

## 2021-03-19 ENCOUNTER — Ambulatory Visit (INDEPENDENT_AMBULATORY_CARE_PROVIDER_SITE_OTHER): Payer: 59 | Admitting: Neurology

## 2021-03-19 DIAGNOSIS — G5601 Carpal tunnel syndrome, right upper limb: Secondary | ICD-10-CM

## 2021-03-19 DIAGNOSIS — M79605 Pain in left leg: Secondary | ICD-10-CM

## 2021-03-19 NOTE — Procedures (Signed)
[facility]  [address]  [place], [place] [address] Tel: [phone] Fax:  [phone] Test Date:  03/19/2021  Patient: Yolanda Young DOB: 20-Dec-1965 Physician: Narda Amber, DO  Sex: Female Height: 5\' 8"  Ref Phys: Metta Clines, D.O.  ID#: [id number]   Technician:    Patient Complaints: This is a 55 year old female referred for evaluation of generalized paresthesias affecting the arms and legs.  NCV & EMG Findings: Extensive electrodiagnostic testing of the right upper extremity and lower extremity, and additional studies of the left upper extremity shows:  1. Right median sensory response shows prolonged latency (3.7 ms).  Right ulnar, left median, left mixed palmar, left sural, and left superficial peroneal sensory responses are within normal limits. 2. Right median, right ulnar, left peroneal, and left tibial motor responses are within normal limits. 3. Left tibial H reflex study is within normal limits. 4. There is no evidence of active or chronic motor axonal loss changes affecting any of the tested muscles.  Motor unit configuration and recruitment pattern is within normal limits.  Impression: 1. Right median neuropathy at or distal to the wrist (mild), consistent with a clinical diagnosis of carpal tunnel syndrome.   2. There is no evidence of a left carpal tunnel syndrome, lumbosacral radiculopathy, or sensorimotor polyneuropathy.   ___________________________ Narda Amber, DO    Nerve Conduction Studies Anti Sensory Summary Table   Stim Site NR Peak (ms) Norm Peak (ms) P-T Amp (V) Norm P-T Amp  Left Median Anti Sensory (2nd Digit)  35C  Wrist    3.2 <3.6 41.7 >15  Right Median Anti Sensory (2nd Digit)  35C  Wrist    3.7 <3.6 40.9 >15  Left Sup Peroneal Anti Sensory (Ant Lat Mall)  35C  12 cm    3.2 <4.6 6.6 >4  Left Sural Anti Sensory (Lat Mall)  35C  Calf    3.1 <4.6 10.1 >4  Right Ulnar Anti Sensory (5th Digit)  35C  Wrist    2.4  <3.1 41.3 >10   Motor Summary Table   Stim Site NR Onset (ms) Norm Onset (ms) O-P Amp (mV) Norm O-P Amp Site1 Site2 Delta-0 (ms) Dist (cm) Vel (m/s) Norm Vel (m/s)  Right Median Motor (Abd Poll Brev)  35C  Wrist    3.7 <4.0 8.1 >6 Elbow Wrist 5.1 28.0 55 >50  Elbow    8.8  7.8         Left Peroneal Motor (Ext Dig Brev)  35C  Ankle    3.8 <6.0 7.9 >2.5 B Fib Ankle 7.6 36.0 47 >40  B Fib    11.4  7.1  Poplt B Fib 1.5 8.0 53 >40  Poplt    12.9  7.0         Left Tibial Motor (Abd Hall Brev)  35C  Ankle    4.8 <6.0 13.6 >4 Knee Ankle 9.0 38.0 42 >40  Knee    13.8  8.6         Right Ulnar Motor (Abd Dig Minimi)  35C  Wrist    2.0 <3.1 11.4 >7 B Elbow Wrist 3.6 24.0 67 >50  B Elbow    5.6  10.6  A Elbow B Elbow 1.6 10.0 62 >50  A Elbow    7.2  10.4          Comparison Summary Table   Stim Site NR Peak (ms) Norm Peak (ms) P-T Amp (V) Site1 Site2 Delta-P (ms) Norm Delta (  ms)  Left Median/Ulnar Palm Comparison (Wrist - 8cm)  35C  Median Palm    1.6 <2.2 118.8 Median Palm Ulnar Palm 0.0   Ulnar Palm    1.6 <2.2 23.0       H Reflex Studies   NR H-Lat (ms) Lat Norm (ms) L-R H-Lat (ms)  Left Tibial (Gastroc)  35C     32.38 <35    EMG   Side Muscle Ins Act Fibs Psw Fasc Number Recrt Dur Dur. Amp Amp. Poly Poly. Comment  Right 1stDorInt Nml Nml Nml Nml Nml Nml Nml Nml Nml Nml Nml Nml N/A  Right Abd Poll Brev Nml Nml Nml Nml Nml Nml Nml Nml Nml Nml Nml Nml N/A  Right PronatorTeres Nml Nml Nml Nml Nml Nml Nml Nml Nml Nml Nml Nml N/A  Right Biceps Nml Nml Nml Nml Nml Nml Nml Nml Nml Nml Nml Nml N/A  Right Triceps Nml Nml Nml Nml Nml Nml Nml Nml Nml Nml Nml Nml N/A  Right Deltoid Nml Nml Nml Nml Nml Nml Nml Nml Nml Nml Nml Nml N/A  Left AntTibialis Nml Nml Nml Nml Nml Nml Nml Nml Nml Nml Nml Nml N/A  Left Gastroc Nml Nml Nml Nml Nml Nml Nml Nml Nml Nml Nml Nml N/A  Left Flex Dig Long Nml Nml Nml Nml Nml Nml Nml Nml Nml Nml Nml Nml N/A  Left RectFemoris Nml Nml Nml Nml Nml Nml Nml Nml  Nml Nml Nml Nml N/A  Left GluteusMed Nml Nml Nml Nml Nml Nml Nml Nml Nml Nml Nml Nml N/A      Waveforms:

## 2021-03-20 NOTE — Progress Notes (Signed)
Pt advised of her EMG results.

## 2021-04-12 ENCOUNTER — Telehealth: Payer: Self-pay | Admitting: Internal Medicine

## 2021-04-12 NOTE — Telephone Encounter (Signed)
Patient just tested positive for COVID today. She would like to know what to do. Running fever of around 100, sore throat, cough, head congestion.

## 2021-04-12 NOTE — Telephone Encounter (Signed)
Spoke with pt and advised her to go to UC since we have no available appts at our office. Pt gave a verbal understanding.

## 2021-07-30 ENCOUNTER — Telehealth: Payer: Self-pay

## 2021-07-30 NOTE — Telephone Encounter (Signed)
New message    Pending   (Key: [id number]) Aimovig '140MG'$ /ML auto-injectors   Form MedImpact ePA Form 2017 NCPDP Created 5 days ago Sent to Plan 7 minutes ago Plan Response 7 minutes ago Submit Clinical Questions less than a minute ago Determination Wait for Determination Please wait for MedImpact 2017 to return a determination.

## 2021-08-07 NOTE — Progress Notes (Signed)
NEUROLOGY FOLLOW UP OFFICE NOTE  Yolanda Young [id number]  Assessment/Plan:   Chronic migraine without aura, without status migrainosus, not intractable - now with medication overuse Reports prior history of abnormal brain MRI. Paresthesias/Muscle spasms - unclear etiology.  Workup negative.  May be manifestation of her fibromyalgia.  However, given history of an abnormal brain MRI, will evaluate for findings consistent with MS.  Migraine prevention:  Continue Aimovig '140mg'$ .  Advised to discontinue ibuprofen. Migraine rescue:  She will try Nurtec with or without sumatriptan. Check MRI of brain and cervical spine with and without contrast. Limit use of pain relievers to no more than 2 days out of week to prevent risk of rebound or medication-overuse headache. Keep headache diary Follow up 6 months or sooner   Subjective:  Yolanda Young is a 55 year old right-handed female with hepatic steatosis and fibromyalgia who follows up for migraines and paresthesias.  UPDATE: NCV-EMG on 03/19/2021 demonstrated mild right carpal tunnel syndrome but no polyneuropathy.  Continues to have leg cramps, spasms and numbness in arms and hands.  She reports that many years ago, she had an MRI of the brain that showed "lesions"  Started Aimovig in February (insurance denied Ajovy).  Migraines were occurring 2-3 days a week.  However, she had COVID in May and July and has had a daily headache since May 3.  Migraines are severe, occur once or twice a week, lasting 2-3 days.  Taking ibuprofen daily.  Current NSAIDS/analgesics:  Tramadol, ibuprofen  Current triptans:  sumatriptan '100mg'$  Current ergotamine:  none Current anti-emetic:  Zofran '4mg'$  Current muscle relaxants:  Tizanidine '2mg'$  TID Current Antihypertensive medications:  none Current Antidepressant medications:  Amitriptyline '50mg'$  at bedtime Current Anticonvulsant medications:  none Current anti-CGRP:  Aimovig '140mg'$  Current  Vitamins/Herbal/Supplements:  Melatonin 10-'20mg'$  at night Current Antihistamines/Decongestants:  noen Other therapy:  none Hormone/birth control:  none  Caffeine:  1 cup of coffee a day (down from 4 cups) Diet:  Not hydrates enough.  Skips meals Exercise:  Not recently Depression:  no; Anxiety:  Yes.  Panic attacks Other pain:  fibromyalgia Sleep:  Poor.  Insomnia  HISTORY: She reports headaches since childhood.  She has visual aura of splotchy and tunnel vision and left sided facial tingling followed by severe left sided pounding headache.  Nausea, vomiting, photophobia, phonophobia, osmophobia, and generalized weakness.  They severe ones last 2 days followed by fatigue for a couple of days afterwards.  Moderate headaches last a day (ocular migraines with mild headache).  Migraines occur 20-24 days a month, 15 are severe.  Triggers include diet soft drinks, stress, bright lights.  Resting in a dark room helps.  She has neck pain.  Cervical X-ray from 03/05/2020 personally reviewed showed diffuse multilevel degenerative changes with mild straightening of the cervical spine.  Currently not treating     She reports constant pins and needles sensation in the arms and legs since 2008.  She has low back pain radiating down the left posterior leg.  Thyroid panel was normal, B12 350 and Hgb A1c 5.9.  She underwent autoimmune workup.  She had a positive ANA 1/320 homo and sed rate 51, but ENA, RF, C3, C4, CCP antibodies, anticardiolipin IgG, lupus anticoagulant,  beta-2 glycoprotein and UPEP were normal, indeterminate ACL IgM of 17 and polyclonal immunoglobulin.  CK 55.  X-ray of bilateral hips normal.  X-ray of lumbar spine showed anterior osteophyte at T12-L1 and normal SI joints.  She was seen by rheumatology who  diagnosed fibromyalgia.       Past NSAIDS/analgesics:  Meloxicam, Excedrin, Fioricet Past abortive triptans:  Maxalt Past abortive ergotamine:  none Past muscle relaxants:  none Past  anti-emetic:  none Past antihypertensive medications:  none Past antidepressant medications:  none Past anticonvulsant medications:  topiramate Past anti-CGRP:  none Past vitamins/Herbal/Supplements:  none Past antihistamines/decongestants:  none Other past therapies:  none    Family history of headache:  Both grandmothers and aunts  PAST MEDICAL HISTORY: Past Medical History:  Diagnosis Date   Colon polyp 02/04/2018   tubular adenoma   GERD (gastroesophageal reflux disease)    History of hiatal hernia    Migraine    weekly   Panic attacks     MEDICATIONS: Current Outpatient Medications on File Prior to Visit  Medication Sig Dispense Refill   albuterol (VENTOLIN HFA) 108 (90 Base) MCG/ACT inhaler Inhale 2 puffs into the lungs every 4 (four) hours as needed for wheezing or shortness of breath. (Patient not taking: Reported on 02/01/2021) 1 each 0   amitriptyline (ELAVIL) 50 MG tablet Take 1 tablet (50 mg total) by mouth at bedtime. 30 tablet 2   colestipol (COLESTID) 1 g tablet Take by mouth.     Erenumab-aooe (AIMOVIG) 140 MG/ML SOAJ Inject 140 mg into the skin every 28 (twenty-eight) days. 1.12 mL 5   ibuprofen (ADVIL,MOTRIN) 200 MG tablet Take 600 mg by mouth every 6 (six) hours as needed for moderate pain.     ondansetron (ZOFRAN ODT) 4 MG disintegrating tablet Take 1 tablet (4 mg total) by mouth every 8 (eight) hours as needed for nausea or vomiting. 20 tablet 5   pantoprazole (PROTONIX) 40 MG tablet Take 40 mg by mouth daily.     pseudoephedrine-codeine-guaifenesin (MYTUSSIN DAC) 30-10-100 MG/5ML solution Take 10 mLs by mouth 4 (four) times daily as needed for cough. (Patient not taking: Reported on 02/01/2021) 120 mL 0   SUMAtriptan (IMITREX) 100 MG tablet Take 1 tablet earliest onset of headache.  May repeat in 2 hours if headache persists or recurs.  Maximum 2 tablets in 24 hours 10 tablet 5   tiZANidine (ZANAFLEX) 2 MG tablet Take 2 mg by mouth 3 (three) times daily.      traMADol (ULTRAM) 50 MG tablet Take 1 tablet (50 mg total) by mouth 2 (two) times daily as needed for up to 60 doses for moderate pain. 60 tablet 5   No current facility-administered medications on file prior to visit.    ALLERGIES: Allergies  Allergen Reactions   Azithromycin Rash   Erythromycin Rash   Penicillins Rash    Has patient had a PCN reaction causing immediate rash, facial/tongue/throat swelling, SOB or lightheadedness with hypotension: No Has patient had a PCN reaction causing severe rash involving mucus membranes or skin necrosis: Yes Has patient had a PCN reaction that required hospitalization: No Has patient had a PCN reaction occurring within the last 10 years: No If all of the above answers are "NO", then may proceed with Cephalosporin use.    Prednisone Nausea And Vomiting    FAMILY HISTORY: Family History  Problem Relation Age of Onset   Diabetes Mother    Stroke Father    Crohn's disease Father    Cancer Maternal Grandmother    Cancer Maternal Grandfather    Diabetes Paternal Grandmother       Objective:  Blood pressure 136/90, pulse 83, height '5\' 8"'$  (1.727 m), weight 234 lb 6.4 oz (106.3 kg), SpO2 95 %. General: No  acute distress.  Patient appears well-groomed.      Metta Clines, DO  CC: Deborra Medina, MD

## 2021-08-08 ENCOUNTER — Encounter: Payer: Self-pay | Admitting: Neurology

## 2021-08-08 ENCOUNTER — Other Ambulatory Visit: Payer: Self-pay

## 2021-08-08 ENCOUNTER — Ambulatory Visit (INDEPENDENT_AMBULATORY_CARE_PROVIDER_SITE_OTHER): Payer: 59 | Admitting: Neurology

## 2021-08-08 VITALS — BP 136/90 | HR 83 | Ht 68.0 in | Wt 234.4 lb

## 2021-08-08 DIAGNOSIS — R9082 White matter disease, unspecified: Secondary | ICD-10-CM | POA: Diagnosis not present

## 2021-08-08 DIAGNOSIS — G43709 Chronic migraine without aura, not intractable, without status migrainosus: Secondary | ICD-10-CM | POA: Diagnosis not present

## 2021-08-08 NOTE — Patient Instructions (Signed)
Continue Aimovig '140mg'$  every 28 days STOP IBUPROFEN When you get a migraine, take Nurtec (maximum 1 tablet in 24 hours).  You may also try taking the sumatriptan with the Nurtec with option to repeat sumatriptan once in 2 hours if needed Limit use of pain relievers (sumatriptan, over the counter pain relievers) to no more than 2 days out of week to prevent risk of rebound or medication-overuse headache. Keep headache diary MRI of brain and cervical spine with and without contrast Follow up 6 months.

## 2021-08-30 ENCOUNTER — Ambulatory Visit
Admission: RE | Admit: 2021-08-30 | Discharge: 2021-08-30 | Disposition: A | Discharge status: Home / Self Care | Payer: 59 | Source: Ambulatory Visit | Attending: Neurology | Admitting: Neurology

## 2021-08-30 ENCOUNTER — Other Ambulatory Visit: Payer: Self-pay

## 2021-08-30 DIAGNOSIS — R9082 White matter disease, unspecified: Secondary | ICD-10-CM

## 2021-08-30 DIAGNOSIS — G43709 Chronic migraine without aura, not intractable, without status migrainosus: Secondary | ICD-10-CM

## 2021-08-30 IMAGING — MR MR CERVICAL SPINE WO/W CM
5 of 7 series · 27 of 48 positions shown · IV contrast (multihance)
Comparison: Prior radiograph from [DATE] [DATE]. No previous MRIs
available for review.

CLINICAL DATA: Follow-up examination for more for sclerosis,
monitoring.

EXAM:
MRI CERVICAL SPINE WITHOUT AND WITH CONTRAST
TECHNIQUE: Multiplanar and multiecho pulse sequences of the cervical spine, to
include the craniocervical junction and cervicothoracic junction,
were obtained without and with intravenous contrast.
CONTRAST:  20mL MULTIHANCE GADOBENATE DIMEGLUMINE 529 MG/ML IV SOLN

[Series 5: T1 · sagittal · 3.0mm · 0.66mm/px · 4 of 13 slices shown (1 of 2)]
[im 1/13]
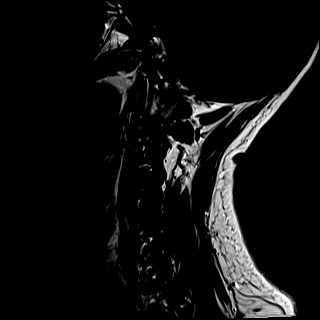
[im 5/13]
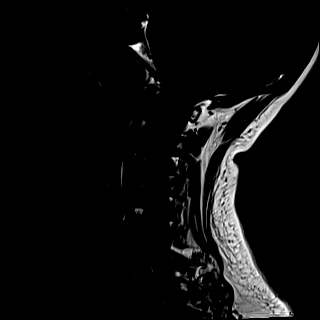
[im 9/13]
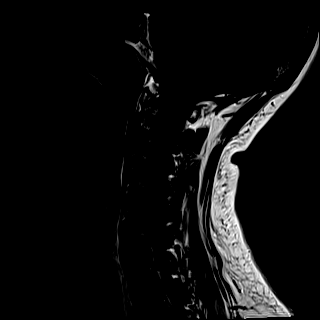
[im 13/13]
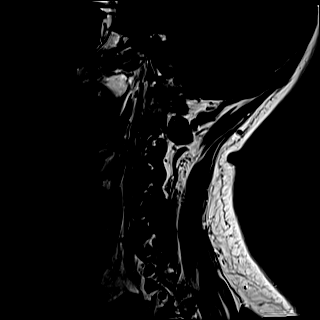

[Series 6: STIR · sagittal · 3.0mm · 0.33mm/px · 1 of 13 slices shown]
[im 1/13]
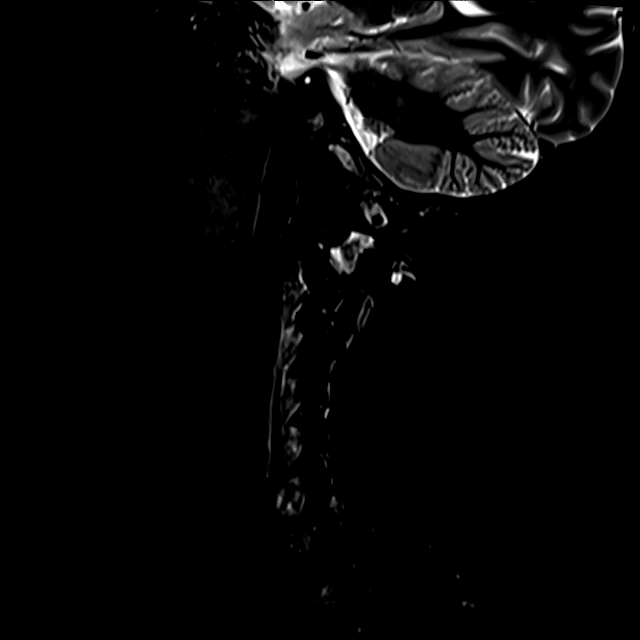

[Series 7: T2 · sagittal · 3.0mm · 0.55mm/px · 4 of 13 slices shown (1 of 2)]
[im 1/13]
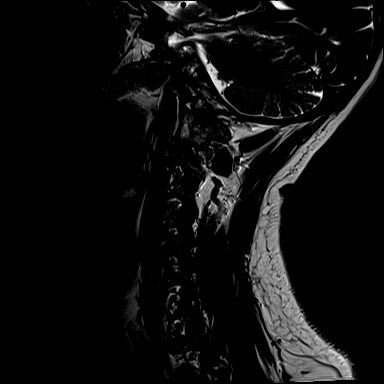
[im 5/13]
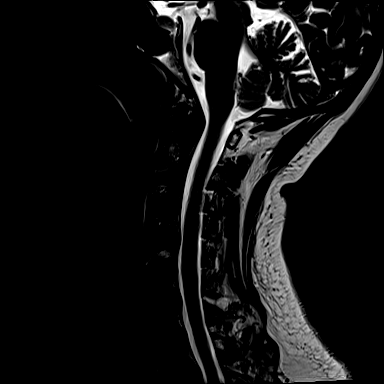
[im 9/13]
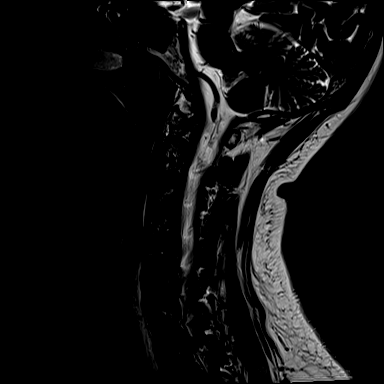
[im 13/13]
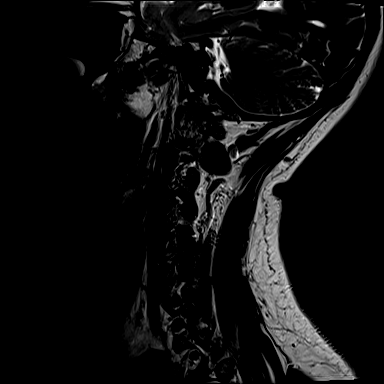

[Series 8: T2 · axial · 3.0mm · 0.50mm/px · z∈[-45,+61]mm · 9 of 34 slices shown (2 of 2)]
[im 1/34]
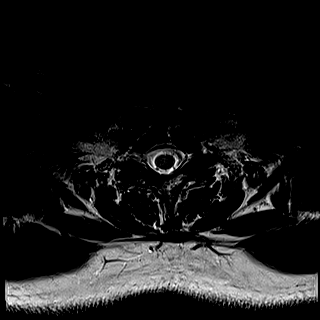
[im 5/34]
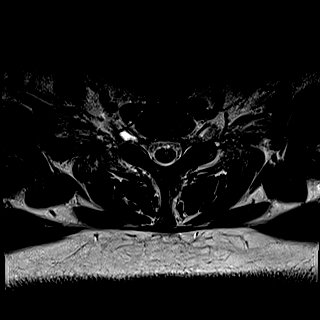
[im 9/34]
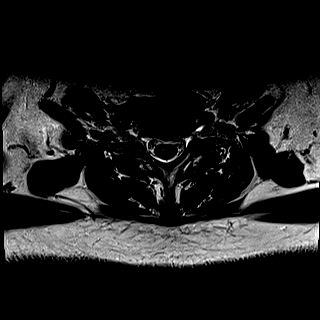
[im 13/34]
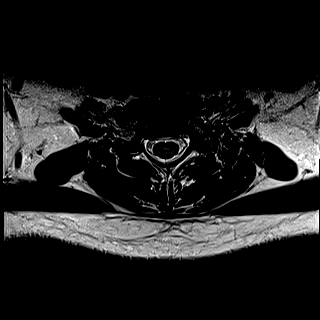
[im 17/34]
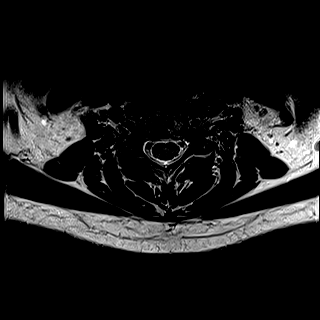
[im 21/34]
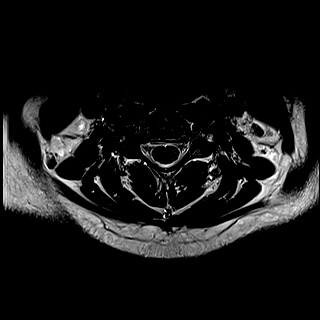
[im 25/34]
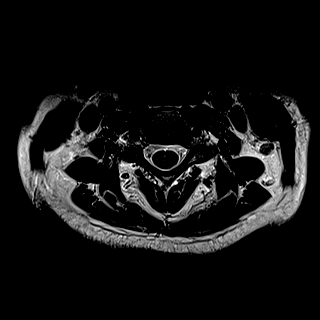
[im 29/34]
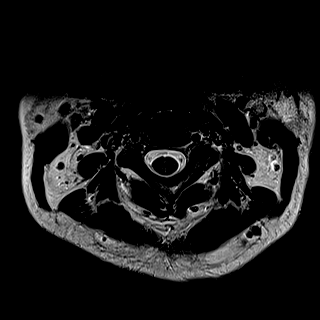
[im 34/34]
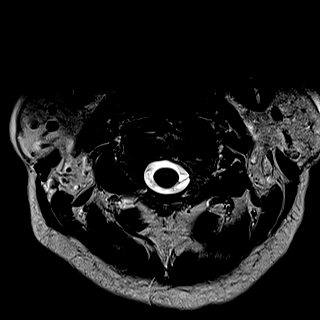

[Series 10: T1 · axial · non-contrast · 3.0mm · 0.31mm/px · z∈[-45,+61]mm · 9 of 34 slices shown (2 of 2)]
[im 1/34]
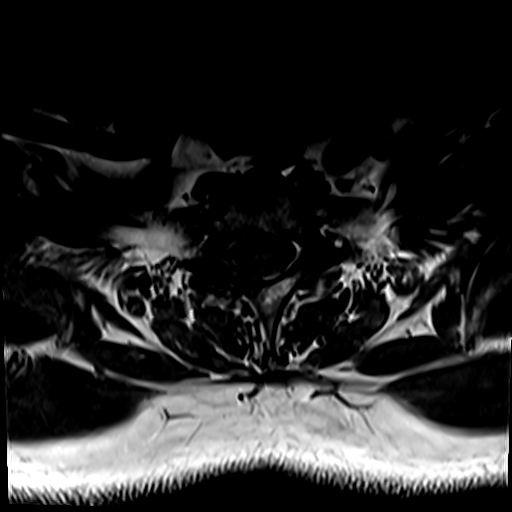
[im 5/34]
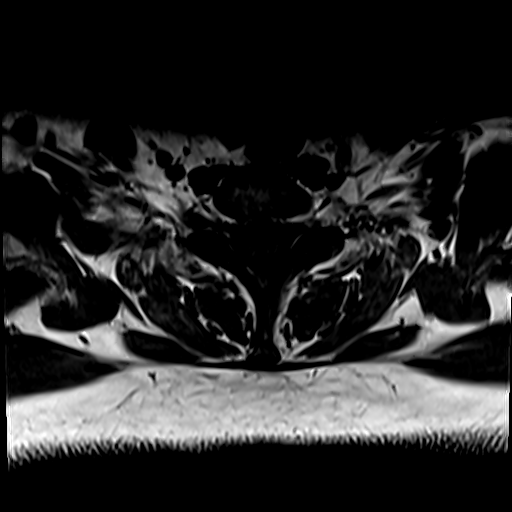
[im 9/34]
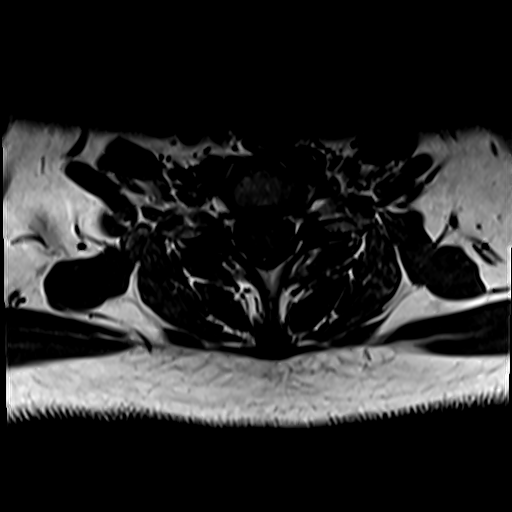
[im 13/34]
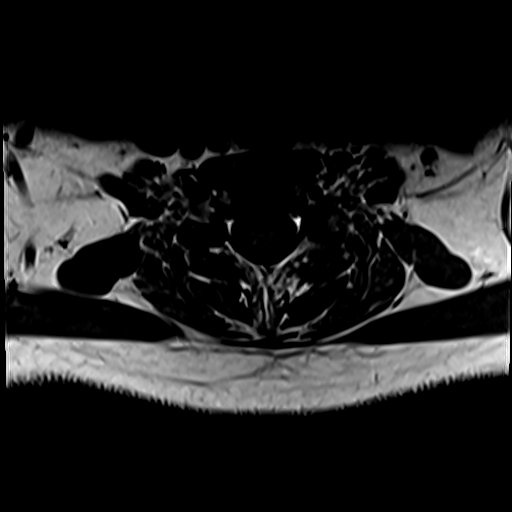
[im 17/34]
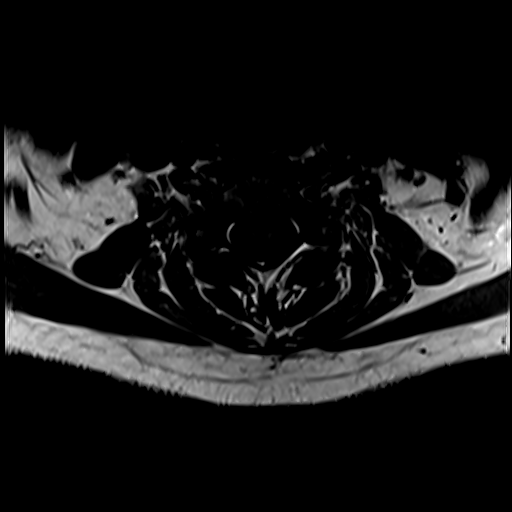
[im 21/34]
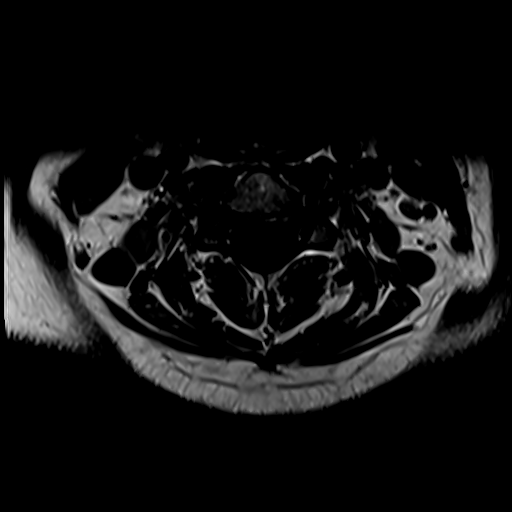
[im 25/34]
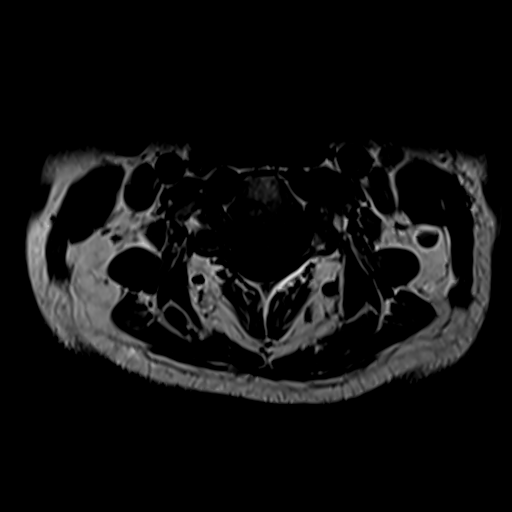
[im 29/34]
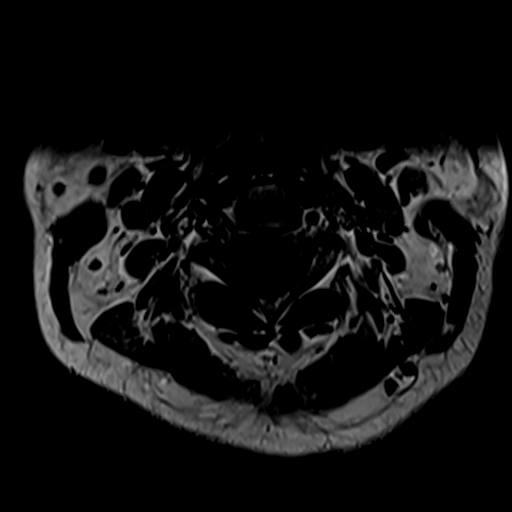
[im 34/34]
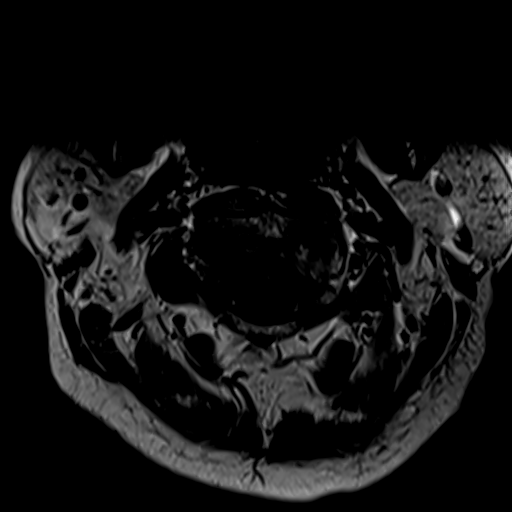

[27 of 48 positions shown; findings below may reference images not displayed]

FINDINGS: Alignment: Straightening of the normal cervical lordosis. No
significant listhesis.

Vertebrae: Vertebral body height maintained without acute or chronic
fracture. Bone marrow signal intensity within normal limits. Few
scattered benign hemangiomata noted. No worrisome osseous lesions.
No abnormal marrow edema or enhancement.

Cord: Normal signal morphology. No cord signal changes to suggest
demyelinating disease. No abnormal enhancement.

Posterior Fossa, vertebral arteries, paraspinal tissues:
Craniocervical junction within normal limits. Paraspinous and
prevertebral soft tissues are normal. Normal flow voids seen within
the vertebral arteries bilaterally.

Disc levels:

C2-C3: Unremarkable.

C3-C4:  Unremarkable.

C4-C5: Mild disc bulge with uncovertebral spurring. No spinal
stenosis. Foramina remain patent.

C5-C6: Mild disc bulge with uncovertebral spurring. No spinal
stenosis. Foramina remain patent.

C6-C7: Small central disc protrusion indents the ventral thecal sac.
No significant spinal stenosis or cord impingement. Foramina remain
patent.

C7-T1: Negative interspace. Mild left-sided facet hypertrophy. No
stenosis.

Visualized upper thoracic spine demonstrates no significant finding.
IMPRESSION: 1. Normal MRI appearance of the cervical spinal cord, with no
evidence for demyelinating disease. No abnormal enhancement.
2. Mild noncompressive disc bulging at C4-5 through C6-7 without
stenosis or impingement.

## 2021-08-30 IMAGING — MR MR HEAD WO/W CM
17 series · 48 of 48 positions shown · IV contrast (multihance)
Comparison: None.

CLINICAL DATA: Multiple sclerosis

EXAM:
MRI HEAD WITHOUT AND WITH CONTRAST
TECHNIQUE: Multiplanar, multiecho pulse sequences of the brain and surrounding
structures were obtained without and with intravenous contrast.
CONTRAST:  20mL MULTIHANCE GADOBENATE DIMEGLUMINE 529 MG/ML IV SOLN

[Series 5: T1 · sagittal · 4.0mm · 0.75mm/px · 2 of 31 slices shown (1 of 3)]
[im 1/31]
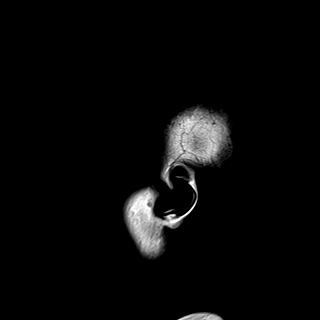
[im 31/31]
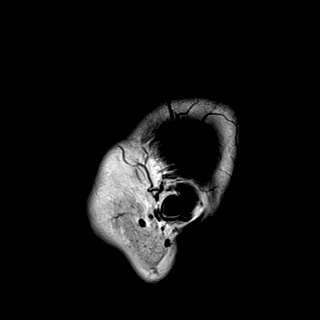

[Series 6: DWI · axial · 3.0mm · 0.94mm/px · z∈[-92,+55]mm · 7 of 168 slices shown (1 of 3)]
[im 1/168]
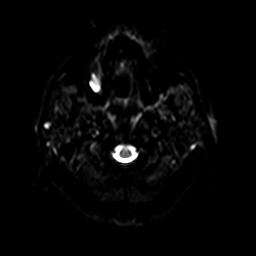
[im 28/168]
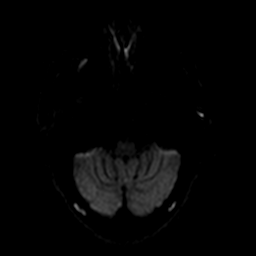
[im 56/168]
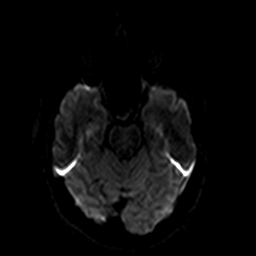
[im 84/168]
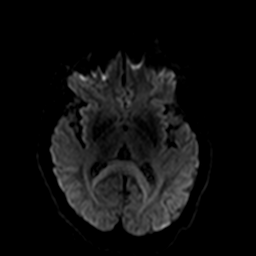
[im 112/168]
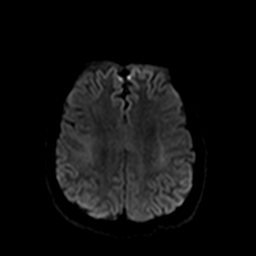
[im 140/168]
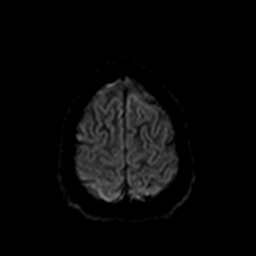
[im 168/168]
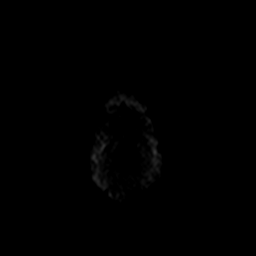

[Series 7: ax dwi_tracew · axial · 3.0mm · 0.94mm/px · z∈[-92,+55]mm · 4 of 84 slices shown]
[im 1/84]
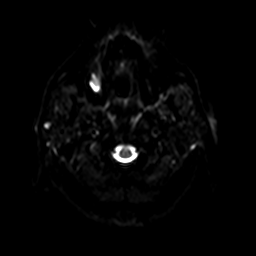
[im 28/84]
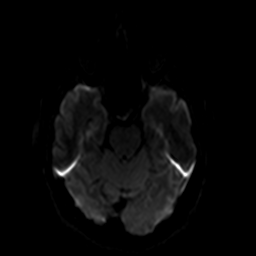
[im 56/84]
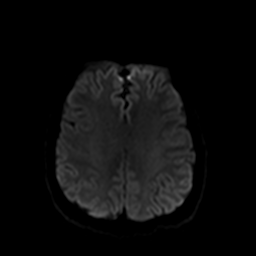
[im 84/84]
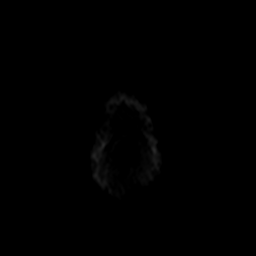

[Series 8: ax dwi_adc · axial · 3.0mm · 0.94mm/px · z∈[-92,+55]mm · 2 of 41 slices shown]
[im 1/41]
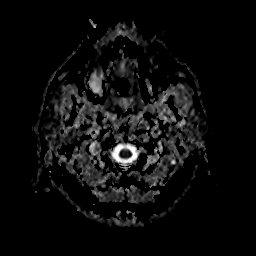
[im 41/41]
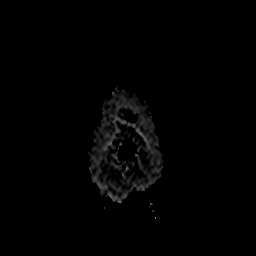

[Series 9: DWI · coronal · 5.0mm · 1.44mm/px · 3 of 66 slices shown (2 of 3)]
[im 1/66]
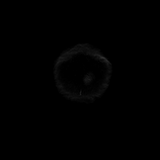
[im 33/66]
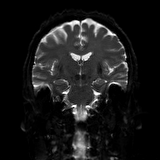
[im 66/66]
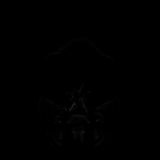

[Series 10: DWI · coronal · 5.0mm · 1.44mm/px · 1 of 33 slices shown (3 of 3)]
[im 1/33]
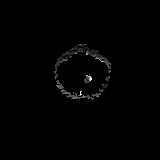

[Series 11: T2 · axial · 4.0mm · 0.36mm/px · 1 of 30 slices shown]
[im 1/30]
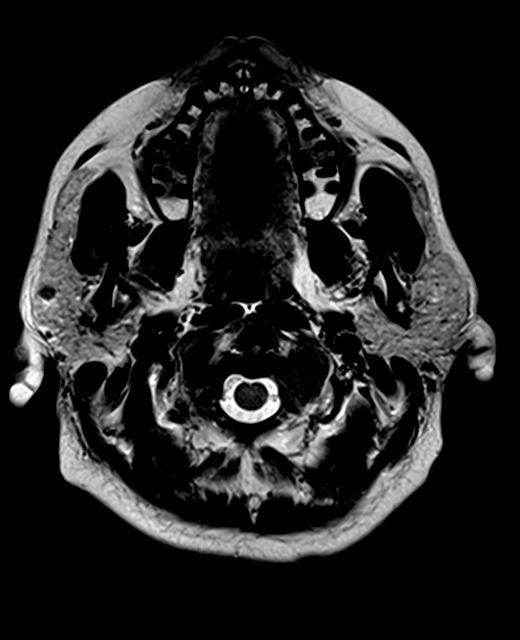

[Series 12: FLAIR · axial · 3.0mm · 0.72mm/px · 1 of 26 slices shown (1 of 2)]
[im 1/26]
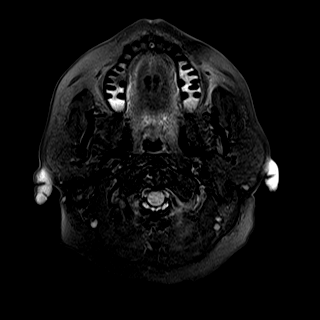

[Series 13: swi_images · axial · 1.5mm · 0.90mm/px · z∈[-91,+51]mm · 4 of 96 slices shown]
[im 1/96]
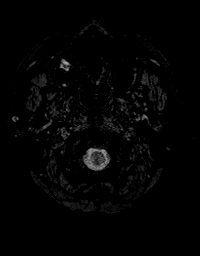
[im 32/96]
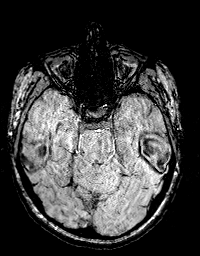
[im 64/96]
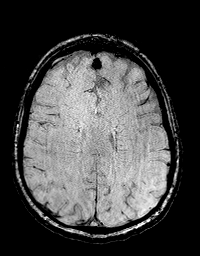
[im 96/96]
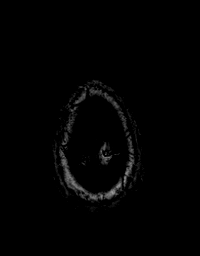

[Series 14: mip_images(sw) · axial · 12.0mm · 0.90mm/px · z∈[-86,+45]mm · 4 of 89 slices shown]
[im 1/89]
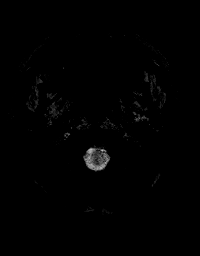
[im 30/89]
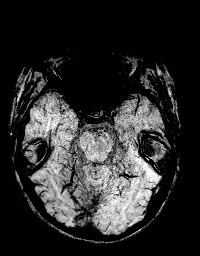
[im 59/89]
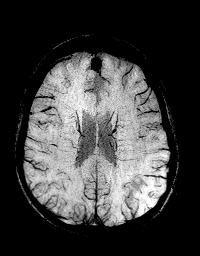
[im 89/89]
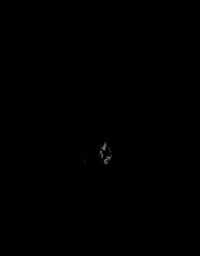

[Series 15: T1 · axial · 1.0mm · 0.94mm/px · z∈[-99,+59]mm · 7 of 160 slices shown (2 of 3)]
[im 1/160]
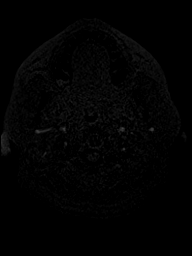
[im 27/160]
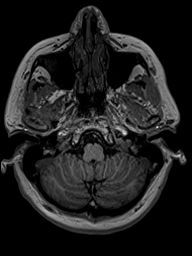
[im 54/160]
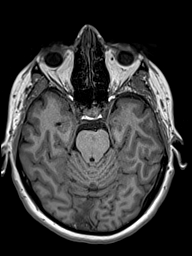
[im 80/160]
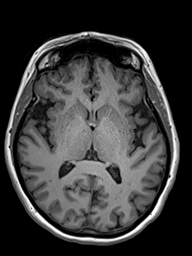
[im 107/160]
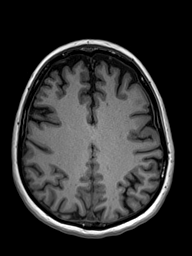
[im 133/160]
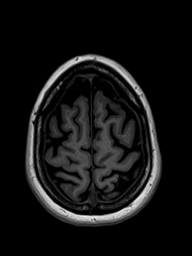
[im 160/160]
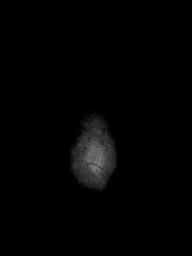

[Series 16: FLAIR · sagittal · 4.0mm · 0.72mm/px · 1 of 31 slices shown (2 of 2)]
[im 1/31]
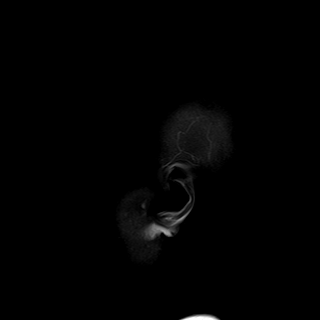

[Series 17: T2 post-contrast · coronal · 4.0mm · 0.36mm/px · 1 of 35 slices shown]
[im 1/35]
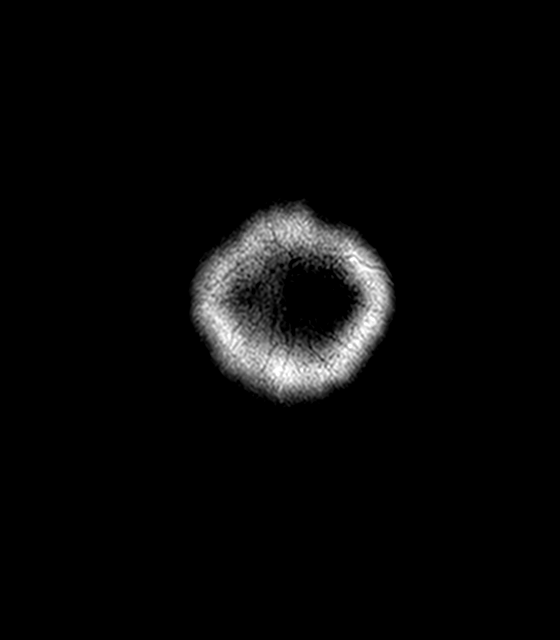

[Series 18: T1 · axial · 1.0mm · 0.94mm/px · z∈[-99,+59]mm · 7 of 160 slices shown (3 of 3)]
[im 1/160]
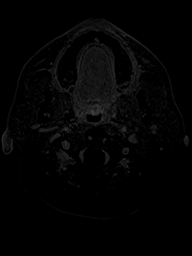
[im 27/160]
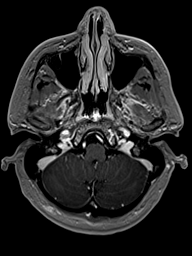
[im 54/160]
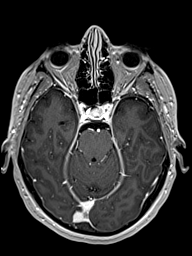
[im 80/160]
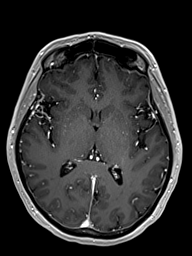
[im 107/160]
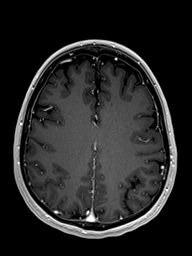
[im 133/160]
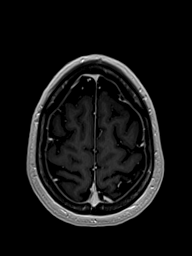
[im 160/160]
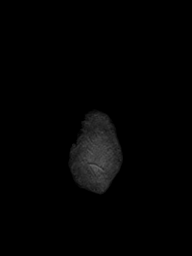

[Series 19: T1 post-contrast · coronal · 4.0mm · 0.72mm/px · 1 of 35 slices shown (1 of 3)]
[im 1/35]
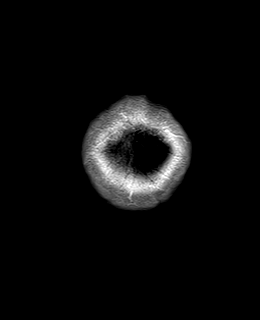

[Series 21: T1 post-contrast · sagittal · 4.0mm · 0.75mm/px · 1 of 31 slices shown (2 of 3)]
[im 1/31]
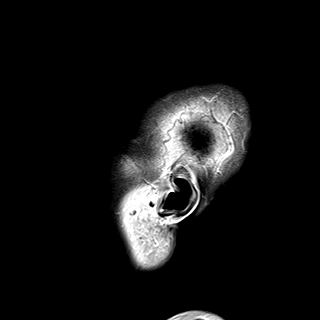

[Series 22: T1 post-contrast · sagittal · 3.0mm · 0.66mm/px · 1 of 15 slices shown (3 of 3)]
[im 1/15]
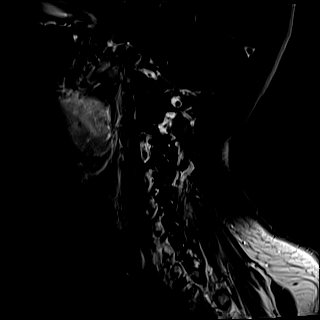

[48 of 48 positions shown; findings below may reference images not displayed]

FINDINGS: Brain: No acute infarct, mass effect or extra-axial collection.
There are foci of hyperintense T2-weighted signal in the superior
left frontal lobe and in the bilateral periatrial white matter. No
acute or chronic hemorrhage. The midline structures are normal.
There is no abnormal contrast enhancement.

Vascular: Major flow voids are preserved.

Skull and upper cervical spine: Normal calvarium and skull base.
Visualized upper cervical spine and soft tissues are normal.

Sinuses/Orbits:No paranasal sinus fluid levels or advanced mucosal
thickening. No mastoid or middle ear effusion. Normal orbits.
IMPRESSION: Scattered foci of hyperintense T2-weighted signal in the bilateral
periatrial white matter and superior left frontal lobe. The
distribution is compatible with multiple sclerosis. No active
demyelination.

## 2021-08-30 MED ORDER — GADOBENATE DIMEGLUMINE 529 MG/ML IV SOLN
20.0000 mL | Freq: Once | INTRAVENOUS | Status: AC | PRN
  Administered 2021-08-30: 20 mL via INTRAVENOUS

## 2021-09-02 ENCOUNTER — Telehealth: Payer: Self-pay | Admitting: Neurology

## 2021-09-02 DIAGNOSIS — R9082 White matter disease, unspecified: Secondary | ICD-10-CM

## 2021-09-02 MED ORDER — EMGALITY 120 MG/ML ~~LOC~~ SOAJ: 240.0000 mg | Freq: Once | SUBCUTANEOUS | 0 refills | Status: AC

## 2021-09-02 MED ORDER — EMGALITY 120 MG/ML ~~LOC~~ SOAJ: 120.0000 mg | SUBCUTANEOUS | 5 refills | Status: DC

## 2021-09-02 NOTE — Telephone Encounter (Signed)
MRI of brain reviewed and discussed with patient.  MRI of brain shows white matter lesions.  Cervical spinal cord clean.  We will proceed with lumbar puncture testing for CSF cell count, glucose, protein, gram stain and culture, cytology, oligoclonal bands, IgG index.  Migraines are still severe, so I have advised that she stop Aimovig.  Instead, we will start Emgality.  I answered all questions to the best of my ability.

## 2021-09-03 NOTE — Addendum Note (Signed)
Addended by: Venetia Night on: 09/03/2021 08:41 AM   Modules accepted: Orders

## 2021-09-03 NOTE — Telephone Encounter (Signed)
Lp Order added [employer] [employer] form filled out and fax back to [phone].

## 2021-09-05 ENCOUNTER — Other Ambulatory Visit: Payer: Self-pay

## 2021-09-05 ENCOUNTER — Other Ambulatory Visit (HOSPITAL_COMMUNITY)
Admission: RE | Admit: 2021-09-05 | Discharge: 2021-09-05 | Disposition: A | Discharge status: Home / Self Care | Payer: 59 | Source: Ambulatory Visit | Attending: Neurology | Admitting: Neurology

## 2021-09-05 ENCOUNTER — Ambulatory Visit
Admission: RE | Admit: 2021-09-05 | Discharge: 2021-09-05 | Disposition: A | Discharge status: Home / Self Care | Payer: 59 | Source: Ambulatory Visit | Attending: Neurology | Admitting: Neurology

## 2021-09-05 VITALS — BP 126/80 | HR 69

## 2021-09-05 DIAGNOSIS — R9082 White matter disease, unspecified: Secondary | ICD-10-CM

## 2021-09-05 IMAGING — XA DG SPINAL PUNCT LUMBAR DIAG WITH FL CT GUIDANCE
1 series · 1 of 1 positions shown · non-contrast
Comparison: None

CLINICAL DATA: White matter abnormality on brain MRI.

EXAM:
DIAGNOSTIC LUMBAR PUNCTURE UNDER FLUOROSCOPIC GUIDANCE

[Series 1: ortho standard · 1 of 1 slices shown]
[im 1/1]
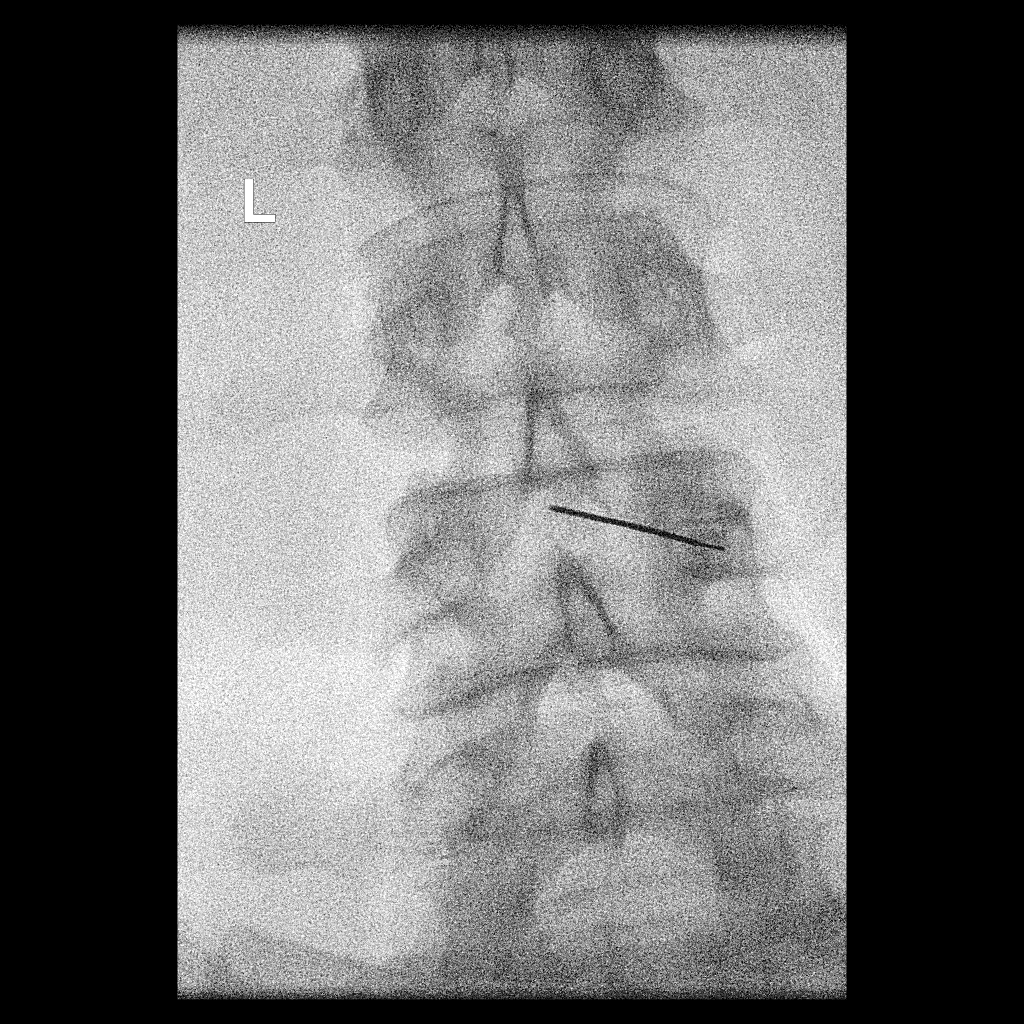

[1 of 1 positions shown; findings below may reference images not displayed]

FLUOROSCOPY TIME:  Fluoroscopy Time:  5 seconds

Radiation Exposure Index (if provided by the fluoroscopic device):
3.86 microGray*m^2

Number of Acquired Spot Images: 0

PROCEDURE:
Informed consent was obtained from the patient prior to the
procedure, including potential complications of headache, allergy,
and pain. With the patient prone, the lower back was prepped with
Betadine. 1% Lidocaine was used for local anesthesia. Lumbar
puncture was performed at the L3-4 level using a 3.5 inch 20 gauge
needle via a right interlaminar approach with return of clear CSF
with an opening pressure of 15 cm water (measured in the left
lateral decubitus position). 9 mL of CSF were obtained for
laboratory studies. The patient tolerated the procedure well and
there were no apparent complications.
IMPRESSION: Technically successful fluoroscopically guided lumbar puncture.

## 2021-09-05 NOTE — Discharge Instructions (Signed)
Lumbar Puncture Discharge Instructions  Go home and rest quietly as needed. You may resume normal activities; however, do not exert yourself strongly or do any heavy lifting today and tomorrow.   DO NOT drive today.    You may resume your normal diet and medications unless otherwise indicated. Drink a lot of extra fluids today and tomorrow.   The incidence of a spinal headache is about 5% (one in 20 patients).  If you develop a headache when you are sitting up or standing that gets better when you lie down, please lie flat for 24 hours and drink plenty of fluids until the headache goes away.  Caffeinated beverages may be helpful.   If you develop severe nausea and vomiting or a headache that does not go away with the flat bedrest after 48 hours, please call [phone].   Call your physician for a follow-up appointment.  The results of your myelogram will be sent directly to your physician by the following day.  Please call us at [phone] if you have any questions or if complications develop after you arrive home.   Discharge instructions have been explained to the patient.  The patient, or the person responsible for the patient, state they fully understands these instructions.   Thank you for visiting our office today.

## 2021-09-05 NOTE — Progress Notes (Signed)
1 vial of blood drawn from pts RAC to be sent off with LP lab work. 1 successful attempt, pt tolerated well. Gauze and tape applied after.

## 2021-09-06 LAB — CYTOLOGY - NON PAP

## 2021-09-06 NOTE — Telephone Encounter (Signed)
F/u  Resubmit   Yolanda Young Key: [id number] - [place] Case ID: [id number] - Rx #: [id number] Need help? Call us at [phone] Status Sent to [employer] Emgality 120MG /ML auto-injectors (migraine) Form [employer] ePA Form 2017 NCPDP Original Claim Info [phone]

## 2021-09-09 ENCOUNTER — Telehealth: Payer: Self-pay

## 2021-09-09 NOTE — Telephone Encounter (Signed)
New message  ALLI JASMER Key: [id number] - [place] Case ID: [id number] - Rx #: [id number] Need help? Call us at [phone] Outcome N/Aon September 30 The prior authorization request has been cancelled. For questions or additional information, please call [phone]. Drug Emgality 120MG /ML auto-injectors (migraine) Form [employer] ePA Form 2017 NCPDP Original Claim Info [id number]

## 2021-09-11 LAB — CNS IGG SYNTHESIS RATE, CSF+BLOOD
Albumin Serum: 4.2 g/dL (ref 3.5–5.2)
Albumin, CSF: 14.7 mg/dL (ref 8.0–42.0)
CNS-IgG Synthesis Rate: -4.2 mg/24 h (ref <=3.3)
IgG (Immunoglobin G), Serum: 999 mg/dL (ref 600–1640)
IgG Total CSF: 1.5 mg/dL (ref 0.8–7.7)
IgG-Index: 0.43 (ref <0.66)

## 2021-09-11 LAB — CSF CULTURE W GRAM STAIN
MICRO NUMBER:: 12439602
Result:: NO GROWTH
SPECIMEN QUALITY:: ADEQUATE

## 2021-09-11 LAB — CSF CELL COUNT WITH DIFFERENTIAL
RBC Count, CSF: 0 cells/uL
WBC, CSF: 0 cells/uL (ref 0–5)

## 2021-09-11 LAB — GLUCOSE, CSF: Glucose, CSF: 54 mg/dL (ref 40–80)

## 2021-09-11 LAB — OLIGOCLONAL BANDS, CSF + SERM

## 2021-09-11 LAB — PROTEIN, CSF: Total Protein, CSF: 31 mg/dL (ref 15–45)

## 2021-09-13 ENCOUNTER — Telehealth: Payer: Self-pay | Admitting: Neurology

## 2021-09-13 NOTE — Telephone Encounter (Signed)
Pt called in and left a message. She stated Dr. Tomi Likens left her a message about some test results and she was returning the call.

## 2021-09-16 ENCOUNTER — Other Ambulatory Visit: Payer: Self-pay

## 2021-09-16 MED ORDER — BACLOFEN 10 MG PO TABS: 10.0000 mg | ORAL_TABLET | Freq: Three times a day (TID) | ORAL | 3 refills | Status: DC

## 2021-09-16 NOTE — Progress Notes (Signed)
Per Dr.Jaffe,  send baclofen 10mg  three times daily (send for 90 tablets with 3 refills).

## 2022-02-17 NOTE — Progress Notes (Unsigned)
NEUROLOGY FOLLOW UP OFFICE NOTE  [name] [id number]  Assessment/Plan:   Chronic migraine without aura, without status migrainosus, not intractable - now with medication overuse Abnormal brain MRI.  Pattern may be indicative of MS however all other workup (including imaging of C-spine and CSF analysis) were negative.   Paresthesias/Muscle spasms - unclear etiology.  Workup negative.  May be manifestation of her fibromyalgia.   Abnormal brain MRI    Migraine prevention:  Continue Aimovig '140mg'$ .  Advised to discontinue ibuprofen. Migraine rescue:  She will try Nurtec with or without sumatriptan. Check MRI of brain and cervical spine with and without contrast. Limit use of pain relievers to no more than 2 days out of week to prevent risk of rebound or medication-overuse headache. Keep headache diary Follow up 6 months or sooner     Subjective:  Yolanda Young is a 56 year old right-handed female with hepatic steatosis and fibromyalgia who follows up for migraines and paresthesias.   UPDATE: Continues to have leg cramps, spasms and numbness in arms and hands.  She reports that many years ago, she had an MRI of the brain that showed "lesions".  She had imaging on 08/30/2021, personally reviewed.  MRI of brain with and without contrast showed scattered hyperintense T2 foci in the bilateral periatrial white matter and superior left frontal lobe without abnormal enhancement.  MRI of cervical spine with and without contrast showed mild noncompressive disc bulging at C4-5 through C6-7 but no spinal cord abnormality.  She underwent lumbar puncture on 09/05/2021 which demonstrated cell count 0, protein 31, glucose 54, negative culture, negative cytology,IgG index 0.43, and 0 bands.     Nurtec?  ***   Current NSAIDS/analgesics:  Tramadol, ibuprofen  Current triptans:  sumatriptan '100mg'$  Current ergotamine:  none Current anti-emetic:  Zofran '4mg'$  Current muscle relaxants:  Tizanidine '2mg'$   TID Current Antihypertensive medications:  none Current Antidepressant medications:  Amitriptyline '50mg'$  at bedtime Current Anticonvulsant medications:  none Current anti-CGRP:  Aimovig '140mg'$  Current Vitamins/Herbal/Supplements:  Melatonin 10-'20mg'$  at night Current Antihistamines/Decongestants:  noen Other therapy:  none Hormone/birth control:  none   Caffeine:  1 cup of coffee a day (down from 4 cups) Diet:  Not hydrates enough.  Skips meals Exercise:  Not recently Depression:  no; Anxiety:  Yes.  Panic attacks Other pain:  fibromyalgia Sleep:  Poor.  Insomnia   HISTORY: She reports headaches since childhood.  She has visual aura of splotchy and tunnel vision and left sided facial tingling followed by severe left sided pounding headache.  Nausea, vomiting, photophobia, phonophobia, osmophobia, and generalized weakness.  They severe ones last 2 days followed by fatigue for a couple of days afterwards.  Moderate headaches last a day (ocular migraines with mild headache).  Migraines occur 20-24 days a month, 15 are severe.  Triggers include diet soft drinks, stress, bright lights.  Resting in a dark room helps.  She has neck pain.  Cervical X-ray from 03/05/2020 personally reviewed showed diffuse multilevel degenerative changes with mild straightening of the cervical spine.  Currently not treating     She reports constant pins and needles sensation in the arms and legs since 2008.  She has low back pain radiating down the left posterior leg.  Thyroid panel was normal, B12 350 and Hgb A1c 5.9.  She underwent autoimmune workup.  She had a positive ANA 1/320 homo and sed rate 51, but ENA, RF, C3, C4, CCP antibodies, anticardiolipin IgG, lupus anticoagulant,  beta-2 glycoprotein and  UPEP were normal, indeterminate ACL IgM of 17 and polyclonal immunoglobulin.  CK 55.  X-ray of bilateral hips normal.  X-ray of lumbar spine showed anterior osteophyte at T12-L1 and normal SI joints.  She was seen by  rheumatology who diagnosed fibromyalgia.  NCV-EMG on 03/19/2021 demonstrated mild right carpal tunnel syndrome but no polyneuropathy.     Past NSAIDS/analgesics:  Meloxicam, Excedrin, Fioricet Past abortive triptans:  Maxalt Past abortive ergotamine:  none Past muscle relaxants:  none Past anti-emetic:  none Past antihypertensive medications:  none Past antidepressant medications:  none Past anticonvulsant medications:  topiramate Past anti-CGRP:  none Past vitamins/Herbal/Supplements:  none Past antihistamines/decongestants:  none Other past therapies:  none     Family history of headache:  Both grandmothers and aunts  PAST MEDICAL HISTORY: Past Medical History:  Diagnosis Date   Colon polyp 02/04/2018   tubular adenoma   GERD (gastroesophageal reflux disease)    History of hiatal hernia    Migraine    weekly   Panic attacks     MEDICATIONS: Current Outpatient Medications on File Prior to Visit  Medication Sig Dispense Refill   albuterol (VENTOLIN HFA) 108 (90 Base) MCG/ACT inhaler Inhale 2 puffs into the lungs every 4 (four) hours as needed for wheezing or shortness of breath. (Patient not taking: Reported on 08/08/2021) 1 each 0   amitriptyline (ELAVIL) 50 MG tablet Take 1 tablet (50 mg total) by mouth at bedtime. 30 tablet 2   baclofen (LIORESAL) 10 MG tablet Take 1 tablet (10 mg total) by mouth 3 (three) times daily. 90 each 3   colestipol (COLESTID) 1 g tablet Take by mouth.     Galcanezumab-gnlm (EMGALITY) 120 MG/ML SOAJ Inject 120 mg into the skin every 28 (twenty-eight) days. 1.12 mL 5   ibuprofen (ADVIL,MOTRIN) 200 MG tablet Take 600 mg by mouth every 6 (six) hours as needed for moderate pain.     ondansetron (ZOFRAN ODT) 4 MG disintegrating tablet Take 1 tablet (4 mg total) by mouth every 8 (eight) hours as needed for nausea or vomiting. 20 tablet 5   pantoprazole (PROTONIX) 40 MG tablet Take 40 mg by mouth daily.     pseudoephedrine-codeine-guaifenesin (MYTUSSIN  DAC) 30-10-100 MG/5ML solution Take 10 mLs by mouth 4 (four) times daily as needed for cough. (Patient not taking: Reported on 08/08/2021) 120 mL 0   SUMAtriptan (IMITREX) 100 MG tablet Take 1 tablet earliest onset of headache.  May repeat in 2 hours if headache persists or recurs.  Maximum 2 tablets in 24 hours 10 tablet 5   tiZANidine (ZANAFLEX) 2 MG tablet Take 2 mg by mouth 3 (three) times daily.     traMADol (ULTRAM) 50 MG tablet Take 1 tablet (50 mg total) by mouth 2 (two) times daily as needed for up to 60 doses for moderate pain. 60 tablet 5   No current facility-administered medications on file prior to visit.    ALLERGIES: Allergies  Allergen Reactions   Azithromycin Rash   Erythromycin Rash   Penicillins Rash    Has patient had a PCN reaction causing immediate rash, facial/tongue/throat swelling, SOB or lightheadedness with hypotension: No Has patient had a PCN reaction causing severe rash involving mucus membranes or skin necrosis: Yes Has patient had a PCN reaction that required hospitalization: No Has patient had a PCN reaction occurring within the last 10 years: No If all of the above answers are "NO", then may proceed with Cephalosporin use.    Prednisone Nausea And Vomiting  FAMILY HISTORY: Family History  Problem Relation Age of Onset   Diabetes Mother    Stroke Father    Crohn's disease Father    Cancer Maternal Grandmother    Cancer Maternal Grandfather    Diabetes Paternal Grandmother       Objective:  *** General: No acute distress.  Patient appears ***-groomed.   Head:  Normocephalic/atraumatic Eyes:  Fundi examined but not visualized Neck: supple, no paraspinal tenderness, full range of motion Heart:  Regular rate and rhythm Lungs:  Clear to auscultation bilaterally Back: No paraspinal tenderness Neurological Exam: alert and oriented to person, place, and time.  Speech fluent and not dysarthric, language intact.  CN II-XII intact. Bulk and tone  normal, muscle strength 5/5 throughout.  Sensation to light touch intact.  Deep tendon reflexes 2+ throughout, toes downgoing.  Finger to nose testing intact.  Gait normal, Romberg negative.   Metta Clines, DO  CC: ***

## 2022-02-18 ENCOUNTER — Encounter: Payer: Self-pay | Admitting: Neurology

## 2022-02-18 ENCOUNTER — Other Ambulatory Visit: Payer: Self-pay

## 2022-02-18 ENCOUNTER — Ambulatory Visit: Payer: 59 | Admitting: Neurology

## 2022-02-18 VITALS — BP 133/76 | HR 94 | Ht 68.0 in | Wt 230.6 lb

## 2022-02-18 DIAGNOSIS — R410 Disorientation, unspecified: Secondary | ICD-10-CM

## 2022-02-18 DIAGNOSIS — G8929 Other chronic pain: Secondary | ICD-10-CM

## 2022-02-18 DIAGNOSIS — R9082 White matter disease, unspecified: Secondary | ICD-10-CM

## 2022-02-18 DIAGNOSIS — G43109 Migraine with aura, not intractable, without status migrainosus: Secondary | ICD-10-CM

## 2022-02-18 MED ORDER — NURTEC 75 MG PO TBDP: 75.0000 mg | ORAL_TABLET | Freq: Every day | ORAL | 5 refills | Status: DC | PRN

## 2022-02-18 MED ORDER — PROPRANOLOL HCL ER 60 MG PO CP24: 60.0000 mg | ORAL_CAPSULE | Freq: Every day | ORAL | 5 refills | Status: DC

## 2022-02-18 NOTE — Patient Instructions (Signed)
Start propranolol ER '60mg'$  daily.  If no improvement in headaches in 6 weeks, contact me ?Sent script to Nurtec to [place]  Except text message ?Limit use of pain relievers to no more than 2 days out of week to prevent risk of rebound or medication-overuse headache. ?Keep headache diary ?Check EEG ?Follow up 5 months. ?

## 2022-02-24 ENCOUNTER — Ambulatory Visit: Payer: 59 | Admitting: Neurology

## 2022-02-24 ENCOUNTER — Other Ambulatory Visit: Payer: Self-pay

## 2022-02-24 DIAGNOSIS — R410 Disorientation, unspecified: Secondary | ICD-10-CM | POA: Diagnosis not present

## 2022-02-24 NOTE — Procedures (Signed)
ELECTROENCEPHALOGRAM REPORT ? ?Date of Study: 02/24/2022 ? ?Patient's Name: Yolanda Young ?MRN: [id number] ?Date of Birth: 01/09/1966 ? ? ?Clinical History: 56 year old female with migraines who had two episodes of disorientation while driving on a familiar route. ? ?Medications: ?Propranolol ?Baclofen ?Emgality ?Zofran ?Sumatriptan ?Tramadol ? ?Technical Summary: ?A multichannel digital EEG recording measured by the international 10-20 system with electrodes applied with paste and impedances below 5000 ohms performed in our laboratory with EKG monitoring in an awake and drowsy patient.  Photic stimulation was performed.  The digital EEG was referentially recorded, reformatted, and digitally filtered in a variety of bipolar and referential montages for optimal display.   ? ?Description: ?The patient is awake and drowsy during the recording.  During maximal wakefulness, there is a symmetric, medium voltage 12 Hz posterior dominant rhythm that attenuates with eye opening.  The record is symmetric.  During drowsiness, there is an increase in theta slowing of the background.  Stage 2 sleep was not seen.  Photic stimulation did not elicit any abnormalities.  There were no epileptiform discharges or electrographic seizures seen.   ? ?EKG lead was unremarkable. ? ?Impression: ?This awake and drowsy EEG is normal.   ? ?Clinical Correlation: ?A normal EEG does not exclude a clinical diagnosis of epilepsy.  If further clinical questions remain, prolonged EEG may be helpful.  Clinical correlation is advised. ? ? ?Metta Clines, DO ? ?

## 2022-03-14 ENCOUNTER — Encounter: Payer: Self-pay | Admitting: Internal Medicine

## 2022-03-14 DIAGNOSIS — R748 Abnormal levels of other serum enzymes: Secondary | ICD-10-CM

## 2022-03-14 DIAGNOSIS — K760 Fatty (change of) liver, not elsewhere classified: Secondary | ICD-10-CM

## 2022-03-17 NOTE — Telephone Encounter (Signed)
Spoke with pt and she stated that she is needing the referral so she can continue to be followed for her abnormal liver enzymes.  ? ?Referral is pended. ?

## 2022-05-20 NOTE — Progress Notes (Unsigned)
Gastroenterology Consultation  Referring Provider:     Crecencio Mc, MD Primary Care Physician:  Crecencio Mc, MD Primary Gastroenterologist:  Dr. Allen Norris     Reason for Consultation:     Abnormal liver enzymes        HPI:   Yolanda Young is a 56 y.o. y/o female referred for consultation & management of Abnormal liver enzymes by Dr. Derrel Nip, Aris Everts, MD.  This patient comes to see me today after being seen in the past by multiple other gastroenterologists for other GI issues including the reason she is here today for abnormal liver enzymes. The patient had an upper endoscopy and colonoscopy by me in 2019 but subsequent to that she was followed by FNP  Jacqulyn Liner at the [facility]. The patient was then seen in the office by Dr. Haig Prophet and then had to office visits by Dr. Alice Reichert.  The patient was noted to have a positive antimitochondrial antibody and antinuclear antigen.  The patient then underwent a liver biopsy and MRCP. The MRCP in 2021 showed:  IMPRESSION: 1. Normal post cholecystectomy appearance of the biliary tree. No choledocholithiasis. 2. Mild hepatic steatosis. 3. Suggestion of submucosal fat deposition in the ascending colon, incompletely evaluated. No pericolonic stranding or signs of bowel obstruction. Correlate with any history of or symptoms that would suggest colitis. No current evidence of pericolonic stranding. 4. Normal variant biliary anatomy as described.  The patient had a liver biopsy that showed:  DIAGNOSIS:  A. LIVER, RIGHT LOBE; ULTRASOUND-GUIDED CORE BIOPSY:  - HEPATIC STEATOSIS, 40%, SEE COMMENT.  - NEGATIVE FOR FIBROSIS.  The comments stated that the patient did not appear to have autoimmune hepatitis nor was there any pathology seen in the bile ducts to suggest PBC. The last liver enzymes I am able to obtain show a normal AST with a ALT of 51 And an alkaline phosphatase of 148.  The patient's GGT has been negative. The liver biopsy also showed no  sign of fibrosis. The patient's colonoscopy was in 2019 and had a tubular adenoma found.  She was recommended to have a repeat colonoscopy in 5 years.  At her last visits to her previous gastrologist she reported that her abdominal pain was better after she would move her bowels.  Past Medical History:  Diagnosis Date   Colon polyp 02/04/2018   tubular adenoma   GERD (gastroesophageal reflux disease)    History of hiatal hernia    Migraine    weekly   Panic attacks     Past Surgical History:  Procedure Laterality Date   ABLATION ON ENDOMETRIOSIS  2013   [place]   CHOLECYSTECTOMY N/A 04/05/2018   Procedure: LAPAROSCOPIC CHOLECYSTECTOMY WITH INTRAOPERATIVE CHOLANGIOGRAM;  Surgeon: Robert Bellow, MD;  Location: [facility];  Service: General;  Laterality: N/A;   COLONOSCOPY WITH PROPOFOL N/A 02/04/2018   Procedure: COLONOSCOPY WITH PROPOFOL;  Surgeon: Lucilla Lame, MD;  Location: [place];  Service: Endoscopy;  Laterality: N/A;   ESOPHAGOGASTRODUODENOSCOPY (EGD) WITH PROPOFOL N/A 02/04/2018   Procedure: ESOPHAGOGASTRODUODENOSCOPY (EGD) WITH PROPOFOL;  Surgeon: Lucilla Lame, MD;  Location: [facility];  Service: Endoscopy;  Laterality: N/A;   ESOPHAGOGASTRODUODENOSCOPY (EGD) WITH PROPOFOL N/A 07/03/2020   Procedure: ESOPHAGOGASTRODUODENOSCOPY (EGD) WITH PROPOFOL;  Surgeon: Lesly Rubenstein, MD;  Location: [facility];  Service: Endoscopy;  Laterality: N/A;   SHOULDER SURGERY Right     Prior to Admission medications   Medication Sig Start Date End Date Taking? Authorizing Provider  albuterol (VENTOLIN HFA) 108 (90 Base) MCG/ACT inhaler Inhale 2 puffs into the lungs every 4 (four) hours as needed for wheezing or shortness of breath. Patient not taking: Reported on 08/08/2021 12/10/20   Verda Cumins, MD  baclofen (LIORESAL) 10 MG tablet Take 1 tablet (10 mg total) by mouth 3 (three) times daily. 09/16/21   Pieter Partridge, DO  colestipol (COLESTID) 1 g tablet Take by  mouth. 08/07/20 02/18/22  [provider]  Galcanezumab-gnlm (EMGALITY) 120 MG/ML SOAJ Inject 120 mg into the skin every 28 (twenty-eight) days. 09/02/21   Pieter Partridge, DO  ibuprofen (ADVIL,MOTRIN) 200 MG tablet Take 600 mg by mouth every 6 (six) hours as needed for moderate pain.    [provider]  ondansetron (ZOFRAN ODT) 4 MG disintegrating tablet Take 1 tablet (4 mg total) by mouth every 8 (eight) hours as needed for nausea or vomiting. 02/01/21   Tomi Likens, Adam R, DO  pantoprazole (PROTONIX) 40 MG tablet Take 40 mg by mouth daily. 07/31/20   [provider]  propranolol ER (INDERAL LA) 60 MG 24 hr capsule Take 1 capsule (60 mg total) by mouth daily. 02/18/22   Pieter Partridge, DO  Rimegepant Sulfate (NURTEC) 75 MG TBDP Take 75 mg by mouth daily as needed. 02/18/22   Pieter Partridge, DO  SUMAtriptan (IMITREX) 100 MG tablet Take 1 tablet earliest onset of headache.  May repeat in 2 hours if headache persists or recurs.  Maximum 2 tablets in 24 hours 02/01/21   Pieter Partridge, DO  traMADol (ULTRAM) 50 MG tablet Take 1 tablet (50 mg total) by mouth 2 (two) times daily as needed for up to 60 doses for moderate pain. 07/04/20   Crecencio Mc, MD    Family History  Problem Relation Age of Onset   Diabetes Mother    Stroke Father    Crohn's disease Father    Cancer Maternal Grandmother    Cancer Maternal Grandfather    Diabetes Paternal Grandmother      Social History   Tobacco Use   Smoking status: Former    Packs/day: 0.50    Years: 2.00    Total pack years: 1.00    Types: Cigarettes    Quit date: 1999    Years since quitting: 24.4   Smokeless tobacco: Never  Vaping Use   Vaping Use: Never used  Substance Use Topics   Alcohol use: Never    Alcohol/week: 4.0 standard drinks of alcohol    Types: 4 Glasses of wine per week   Drug use: No    Allergies as of 05/21/2022 - Review Complete 02/18/2022  Allergen Reaction Noted   Azithromycin Rash 09/07/2012    Erythromycin Rash 06/20/2016   Penicillins Rash 06/20/2016   Prednisone Nausea And Vomiting 07/02/2020    Review of Systems:    All systems reviewed and negative except where noted in HPI.   Physical Exam:  There were no vitals taken for this visit. No LMP recorded. Patient has had an ablation. General:   Alert,  Well-developed, ell-nourished, pleasant and cooperative in NAD Head:  Normocephalic and atraumatic. Eyes:  Sclera clear, no icterus.   Conjunctiva pink. Ears:  Normal auditory acuity. Neck:  Supple; no masses or thyromegaly. Lungs:  Respirations even and unlabored.  Clear throughout to auscultation.   No wheezes, crackles, or rhonchi. No acute distress. Heart:  Regular rate and rhythm; no murmurs, clicks, rubs, or gallops. Abdomen:  Normal bowel sounds.  No bruits.  Soft, non-tender and non-distended without masses, hepatosplenomegaly or hernias noted.  No guarding or rebound tenderness.  Negative Carnett sign.   Rectal:  Deferred.  Pulses:  Normal pulses noted. Extremities:  No clubbing or edema.  No cyanosis. Neurologic:  Alert and oriented x3;  grossly normal neurologically. Skin:  Intact without significant lesions or rashes.  No jaundice. Lymph Nodes:  No significant cervical adenopathy. Psych:  Alert and cooperative. Normal mood and affect.  Imaging Studies: No results found.  Assessment and Plan:   LINDEE LEASON is a 56 y.o. y/o female ***    Lucilla Lame, MD. Marval Regal    Note: This dictation was prepared with Dragon dictation along with smaller phrase technology. Any transcriptional errors that result from this process are unintentional.

## 2022-05-21 ENCOUNTER — Ambulatory Visit (INDEPENDENT_AMBULATORY_CARE_PROVIDER_SITE_OTHER): Payer: 59 | Admitting: Gastroenterology

## 2022-05-21 ENCOUNTER — Encounter: Payer: Self-pay | Admitting: Gastroenterology

## 2022-05-21 VITALS — BP 118/85 | HR 79 | Temp 98.3°F | Ht 68.0 in | Wt 230.0 lb

## 2022-05-21 DIAGNOSIS — R748 Abnormal levels of other serum enzymes: Secondary | ICD-10-CM | POA: Diagnosis not present

## 2022-05-21 MED ORDER — PANTOPRAZOLE SODIUM 40 MG PO TBEC: 40.0000 mg | DELAYED_RELEASE_TABLET | Freq: Every day | ORAL | 3 refills | Status: DC

## 2022-05-21 MED ORDER — URSODIOL 300 MG PO CAPS: 300.0000 mg | ORAL_CAPSULE | Freq: Three times a day (TID) | ORAL | 2 refills | Status: DC

## 2022-05-21 MED ORDER — CHOLESTYRAMINE 4 G PO PACK: 4.0000 g | PACK | Freq: Three times a day (TID) | ORAL | 1 refills | Status: DC

## 2022-05-24 LAB — HEPATIC FUNCTION PANEL
ALT: 26 IU/L (ref 0–32)
AST: 20 IU/L (ref 0–40)
Albumin: 4.5 g/dL (ref 3.8–4.9)
Alkaline Phosphatase: 152 IU/L — ABNORMAL HIGH (ref 44–121)
Bilirubin Total: 0.5 mg/dL (ref 0.0–1.2)
Bilirubin, Direct: 0.12 mg/dL (ref 0.00–0.40)
Total Protein: 7.2 g/dL (ref 6.0–8.5)

## 2022-05-24 LAB — ALKALINE PHOSPHATASE, ISOENZYMES
BONE FRACTION: 44 % (ref 14–68)
INTESTINAL FRAC.: 0 % (ref 0–18)
LIVER FRACTION: 56 % (ref 18–85)

## 2022-07-10 ENCOUNTER — Other Ambulatory Visit: Payer: Self-pay

## 2022-07-10 DIAGNOSIS — R748 Abnormal levels of other serum enzymes: Secondary | ICD-10-CM

## 2022-07-11 LAB — HEPATIC FUNCTION PANEL
ALT: 20 IU/L (ref 0–32)
AST: 18 IU/L (ref 0–40)
Albumin: 4.4 g/dL (ref 3.8–4.9)
Alkaline Phosphatase: 149 IU/L — ABNORMAL HIGH (ref 44–121)
Bilirubin Total: 0.5 mg/dL (ref 0.0–1.2)
Bilirubin, Direct: 0.15 mg/dL (ref 0.00–0.40)
Total Protein: 7 g/dL (ref 6.0–8.5)

## 2022-08-04 NOTE — Progress Notes (Unsigned)
NEUROLOGY FOLLOW UP OFFICE NOTE  [name] [id number]  Assessment/Plan:   Migraine without aura, without status migrainosus, not intractable Abnormal white matter lesions on brain MRI.  Workup (including imaging of C-spine and CSF analysis) were negative.   Paresthesias/Muscle spasms - unclear etiology.  Workup negative.  May be manifestation of her fibromyalgia.   Confusion - may have been migrainous   Migraine prevention:  Increase propranolol ER to '80mg'$  daily.  She is starting new insurance on Sept 1.  She will contact us next week with new insurance info and I will send prescription for Emgality (already tried Aimovig, propranolol, topiramate, and amitriptyline) Migraine rescue:  Will try samples of Ubrelvy '100mg'$ .  Stop sumatriptan as it is ineffective. Limit use of pain relievers to no more than 2 days out of week to prevent risk of rebound or medication-overuse headache. Keep headache diary Advised to follow up with PCP to discuss possible referral to pain management for her chronic pain. Follow up in 4 months.     Subjective:  Yolanda Young is a 56 year old right-handed female with hepatic steatosis and fibromyalgia who follows up for migraines and paresthesias.   UPDATE: Started propranolol Intensity:  moderate  Duration:  1 day (followed by 1 day of postdrome headache) with sumatriptan Frequency:  twice a week.    To assess confusion, she had an EEG on 02/24/2022 which was normal.  She has had two episodes where she was driving on a familiar route and suddenly was disoriented and didn't know where she was.  It lasted a minute. One time it was followed by a mild headache.     Current NSAIDS/analgesics:  Tramadol, ibuprofen  Current triptans:  sumatriptan '100mg'$  Current ergotamine:  none Current anti-emetic:  Zofran '4mg'$  Current muscle relaxants:  baclofen '10mg'$  TID Current Antihypertensive medications:  propranolol ER '60mg'$  daily Current Antidepressant medications:   none Current Anticonvulsant medications:  none Current anti-CGRP:  none Current Vitamins/Herbal/Supplements:  Melatonin 10-'20mg'$  at night Current Antihistamines/Decongestants:  noen Other therapy:  none Hormone/birth control:  none   Caffeine:  1 cup of coffee a day (down from 4 cups) Diet:  Not hydrates enough.  Skips meals Exercise:  Not recently Depression:  no; Anxiety:  Yes.  Panic attacks Other pain:  fibromyalgia - notes significant increase in low back pain and right hip pain Sleep:  Poor.  Insomnia   HISTORY: She reports headaches since childhood.  She has visual aura of splotchy and tunnel vision and left sided facial tingling followed by severe left sided pounding headache.  Nausea, vomiting, photophobia, phonophobia, osmophobia, and generalized weakness.  They severe ones last 2 days followed by fatigue for a couple of days afterwards.  Moderate headaches last a day (ocular migraines with mild headache).  Migraines occur 20-24 days a month, 15 are severe.  Triggers include diet soft drinks, stress, bright lights.  Resting in a dark room helps.  She has neck pain.  Cervical X-ray from 03/05/2020 personally reviewed showed diffuse multilevel degenerative changes with mild straightening of the cervical spine.  Currently not treating     She reports constant pins and needles sensation in the arms and legs since 2008.  She has low back pain radiating down the left posterior leg.  Thyroid panel was normal, B12 350 and Hgb A1c 5.9.  She underwent autoimmune workup.  She had a positive ANA 1/320 homo and sed rate 51, but ENA, RF, C3, C4, CCP antibodies, anticardiolipin IgG, lupus anticoagulant,  beta-2 glycoprotein and UPEP were normal, indeterminate ACL IgM of 17 and polyclonal immunoglobulin.  CK 55.  X-ray of bilateral hips normal.  X-ray of lumbar spine showed anterior osteophyte at T12-L1 and normal SI joints.  She was seen by rheumatology who diagnosed fibromyalgia.  NCV-EMG on 03/19/2021  demonstrated mild right carpal tunnel syndrome but no polyneuropathy.  She had imaging on 08/30/2021.[facility]  MRI of brain with and without contrast showed scattered hyperintense T2 foci in the bilateral periatrial white matter and superior left frontal lobe without abnormal enhancement.  MRI of cervical spine with and without contrast showed mild noncompressive disc bulging at C4-5 through C6-7 but no spinal cord abnormality.  She underwent lumbar puncture on 09/05/2021 which demonstrated cell count 0, protein 31, glucose 54, negative culture, negative cytology,IgG index 0.43, and 0 bands.      Past NSAIDS/analgesics:  Meloxicam, Excedrin, Fioricet Past abortive triptans:  Maxalt Past abortive ergotamine:  none Past muscle relaxants:  tizanidine Past anti-emetic:  none Past antihypertensive medications:  none Past antidepressant medications:  amitriptyline Past anticonvulsant medications:  topiramate Past anti-CGRP:  Aimovig (ineffective) Past vitamins/Herbal/Supplements:  none Past antihistamines/decongestants:  none Other past therapies:  none     Family history of headache:  Both grandmothers and aunts  PAST MEDICAL HISTORY: Past Medical History:  Diagnosis Date   Colon polyp 02/04/2018   tubular adenoma   GERD (gastroesophageal reflux disease)    History of hiatal hernia    Migraine    weekly   Panic attacks     MEDICATIONS: Current Outpatient Medications on File Prior to Visit  Medication Sig Dispense Refill   baclofen (LIORESAL) 10 MG tablet Take 1 tablet (10 mg total) by mouth 3 (three) times daily. 90 each 3   cholestyramine (QUESTRAN) 4 g packet Take 1 packet (4 g total) by mouth 3 (three) times daily. 60 each 1   ibuprofen (ADVIL,MOTRIN) 200 MG tablet Take 600 mg by mouth every 6 (six) hours as needed for moderate pain.     ondansetron (ZOFRAN ODT) 4 MG disintegrating tablet Take 1 tablet (4 mg total) by mouth every 8 (eight) hours as needed for nausea or vomiting. 20 tablet 5    pantoprazole (PROTONIX) 40 MG tablet Take 1 tablet (40 mg total) by mouth daily. 90 tablet 3   traMADol (ULTRAM) 50 MG tablet Take 1 tablet (50 mg total) by mouth 2 (two) times daily as needed for up to 60 doses for moderate pain. 60 tablet 5   ursodiol (ACTIGALL) 300 MG capsule Take 1 capsule (300 mg total) by mouth 3 (three) times daily. 90 capsule 2   colestipol (COLESTID) 1 g tablet Take by mouth. (Patient not taking: Reported on 05/21/2022)     No current facility-administered medications on file prior to visit.     ALLERGIES: Allergies  Allergen Reactions   Azithromycin Rash   Erythromycin Rash   Penicillins Rash    Has patient had a PCN reaction causing immediate rash, facial/tongue/throat swelling, SOB or lightheadedness with hypotension: No Has patient had a PCN reaction causing severe rash involving mucus membranes or skin necrosis: Yes Has patient had a PCN reaction that required hospitalization: No Has patient had a PCN reaction occurring within the last 10 years: No If all of the above answers are "NO", then may proceed with Cephalosporin use.    Prednisone Nausea And Vomiting    FAMILY HISTORY: Family History  Problem Relation Age of Onset   Diabetes Mother  Stroke Father    Crohn's disease Father    Cancer Maternal Grandmother    Cancer Maternal Grandfather    Diabetes Paternal Grandmother       Objective:  Blood pressure (!) 131/96, pulse 83, height '5\' 8"'$  (1.727 m), weight 235 lb 9.6 oz (106.9 kg), SpO2 98 %. General: No acute distress.  Patient appears well-groomed.   Head:  Normocephalic/atraumatic Neck:  supple, full range of motion, bilateral paraspinal tenderness Heart:  RRR Back:  bilateral paraspinal tenderness Neuro: alert and oriented to person, place, and time.  Speech fluent and not dysarthric, language intact.  CN II-XII intact. Bulk and tone normal, muscle strength 5/5 throughout.  Sensation to light touch intact.  Deep tendon reflexes 2+  throughout.  Finger to nose testing intact.  Antalgic gait.  Ambulates with cane.  Romberg negative.    Metta Clines, DO  CC: Yolanda Medina, MD

## 2022-08-06 ENCOUNTER — Ambulatory Visit: Payer: 59 | Admitting: Neurology

## 2022-08-06 ENCOUNTER — Encounter: Payer: Self-pay | Admitting: Neurology

## 2022-08-06 VITALS — BP 131/96 | HR 83 | Ht 68.0 in | Wt 235.6 lb

## 2022-08-06 DIAGNOSIS — R9082 White matter disease, unspecified: Secondary | ICD-10-CM

## 2022-08-06 DIAGNOSIS — G43109 Migraine with aura, not intractable, without status migrainosus: Secondary | ICD-10-CM

## 2022-08-06 DIAGNOSIS — M797 Fibromyalgia: Secondary | ICD-10-CM

## 2022-08-06 MED ORDER — PROPRANOLOL HCL ER 80 MG PO CP24: 80.0000 mg | ORAL_CAPSULE | Freq: Every day | ORAL | 5 refills | Status: DC

## 2022-08-06 NOTE — Patient Instructions (Signed)
Increase propranolol ER to '80mg'$  daily Contact us next week with your new insurance info and we will send in prescription for Emgality Stop sumatriptan.  Instead, take Ubrelvy at earliest onset of migraine.  May repeat in 2 hours.  Maximum 2 tablets in 24 hours.  Let me know if effective Follow up 4 months.

## 2022-09-09 ENCOUNTER — Ambulatory Visit: Payer: 59 | Admitting: Internal Medicine

## 2022-09-12 ENCOUNTER — Ambulatory Visit (INDEPENDENT_AMBULATORY_CARE_PROVIDER_SITE_OTHER): Payer: 59 | Admitting: Internal Medicine

## 2022-09-12 ENCOUNTER — Emergency Department
Admission: EM | Admit: 2022-09-12 | Discharge: 2022-09-12 | Disposition: A | Discharge status: Home / Self Care | Payer: Medicare Other | Attending: Emergency Medicine | Admitting: Emergency Medicine

## 2022-09-12 ENCOUNTER — Other Ambulatory Visit: Payer: Self-pay

## 2022-09-12 ENCOUNTER — Emergency Department: Payer: Medicare Other

## 2022-09-12 ENCOUNTER — Encounter: Payer: Self-pay | Admitting: Internal Medicine

## 2022-09-12 VITALS — BP 170/110 | HR 95 | Temp 98.3°F | Resp 12 | Wt 238.0 lb

## 2022-09-12 DIAGNOSIS — R079 Chest pain, unspecified: Secondary | ICD-10-CM

## 2022-09-12 DIAGNOSIS — R0789 Other chest pain: Secondary | ICD-10-CM | POA: Insufficient documentation

## 2022-09-12 DIAGNOSIS — R0602 Shortness of breath: Secondary | ICD-10-CM | POA: Insufficient documentation

## 2022-09-12 LAB — CBC
HCT: 43 % (ref 36.0–46.0)
Hemoglobin: 14.2 g/dL (ref 12.0–15.0)
MCH: 30.1 pg (ref 26.0–34.0)
MCHC: 33 g/dL (ref 30.0–36.0)
MCV: 91.3 fL (ref 80.0–100.0)
Platelets: 290 10*3/uL (ref 150–400)
RBC: 4.71 MIL/uL (ref 3.87–5.11)
RDW: 12.5 % (ref 11.5–15.5)
WBC: 10.2 10*3/uL (ref 4.0–10.5)
nRBC: 0 % (ref 0.0–0.2)

## 2022-09-12 LAB — TROPONIN I (HIGH SENSITIVITY)
Troponin I (High Sensitivity): 3 ng/L (ref <18)
Troponin I (High Sensitivity): 3 ng/L (ref <18)

## 2022-09-12 LAB — BASIC METABOLIC PANEL
Anion gap: 5 (ref 5–15)
BUN: 14 mg/dL (ref 6–20)
CO2: 23 mmol/L (ref 22–32)
Calcium: 9.3 mg/dL (ref 8.9–10.3)
Chloride: 109 mmol/L (ref 98–111)
Creatinine, Ser: 0.93 mg/dL (ref 0.44–1.00)
GFR, Estimated: >60 mL/min (ref >60)
Glucose, Bld: 99 mg/dL (ref 70–99)
Potassium: 3.7 mmol/L (ref 3.5–5.1)
Sodium: 137 mmol/L (ref 135–145)

## 2022-09-12 MED ORDER — PREDNISONE 50 MG PO TABS: 50.0000 mg | ORAL_TABLET | Freq: Every day | ORAL | 0 refills | Status: DC

## 2022-09-12 MED ORDER — ONDANSETRON 8 MG PO TBDP
8.0000 mg | ORAL_TABLET | Freq: Once | ORAL | Status: AC
  Administered 2022-09-12: 8 mg via ORAL
  Filled 2022-09-12: qty 1

## 2022-09-12 MED ORDER — PREDNISONE 20 MG PO TABS
60.0000 mg | ORAL_TABLET | Freq: Once | ORAL | Status: AC
  Administered 2022-09-12: 60 mg via ORAL
  Filled 2022-09-12: qty 3

## 2022-09-12 NOTE — ED Triage Notes (Signed)
Pt BIB EMS for chest pain that started a few weeks ago. Per pt, pain radiates into her left arm. Pt endorses SOB.    '324mg'$  aspirin '25mg'$  metoprolol  0.4 nitro  CBG 119 152/90 81 97%

## 2022-09-12 NOTE — ED Provider Notes (Signed)
[facility] Provider Note   Event Date/Time   First MD Initiated Contact with Patient 09/12/22 [phone]     (approximate) History  Chest Pain  HPI Yolanda Young is a 56 y.o. female with a stated past medical history of fibromyalgia who presents for chest pain, shortness of breath over the last couple months that has significantly worsened over the past couple weeks.  Patient states that this pain is intermittent and not brought on with any specific activity however she does state that it is mildly worse with exertion as she is unable to do much during these pain spells without feeling very short of breath.  Patient denies any relieving factors. ROS: Patient currently denies any vision changes, tinnitus, difficulty speaking, facial droop, sore throat, abdominal pain, nausea/vomiting/diarrhea, dysuria, or weakness/numbness/paresthesias in any extremity   Physical Exam  Triage Vital Signs: ED Triage Vitals [09/12/22 1549]  Enc Vitals Group     BP 136/80     Pulse Rate 74     Resp 16     Temp 98.4 F (36.9 C)     Temp Source Oral     SpO2 97 %     Weight      Height      Head Circumference      Peak Flow      Pain Score 6     Pain Loc      Pain Edu?      Excl. in [place]?    Most recent vital signs: Vitals:   09/12/22 1549  BP: 136/80  Pulse: 74  Resp: 16  Temp: 98.4 F (36.9 C)  SpO2: 97%   General: Awake, oriented x4. CV:  Good peripheral perfusion.  Resp:  Normal effort.  Abd:  No distention.  Other:  Obese Caucasian middle-aged female laying in bed in no acute distress ED Results / Procedures / Treatments  Labs (all labs ordered are listed, but only abnormal results are displayed) Labs Reviewed  BASIC METABOLIC PANEL  CBC  POC URINE PREG, ED  TROPONIN I (HIGH SENSITIVITY)  TROPONIN I (HIGH SENSITIVITY)   EKG ED ECG REPORT I, Naaman Plummer, the attending physician, personally viewed and interpreted this ECG. Date: 09/12/2022 EKG Time:  1546 Rate: 81 Rhythm: normal sinus rhythm QRS Axis: normal Intervals: normal ST/T Wave abnormalities: normal Narrative Interpretation: no evidence of acute ischemia RADIOLOGY ED MD interpretation: 2 view chest x-ray interpreted by me shows no evidence of acute abnormalities including no pneumonia, pneumothorax, or widened mediastinum -Agree with radiology assessment Official radiology report(s): DG Chest 2 View  Result Date: 09/12/2022 CLINICAL DATA:  Chest pain EXAM: CHEST - 2 VIEW COMPARISON:  Chest x-ray December 10, 2020 FINDINGS: The heart size and mediastinal contours are within normal limits. Both lungs are clear. No acute osseous abnormality. The visualized upper abdomen is unremarkable. IMPRESSION: No acute cardiopulmonary abnormality. Electronically Signed   By: Beryle Flock M.D.   On: 09/12/2022 16:14   PROCEDURES: Critical Care performed: No Procedures MEDICATIONS ORDERED IN ED: Medications - No data to display IMPRESSION / MDM / [place] / ED COURSE  I reviewed the triage vital signs and the nursing notes.                             The patient is on the cardiac monitor to evaluate for evidence of arrhythmia and/or significant heart rate changes. Patient's presentation is most consistent  with acute presentation with potential threat to life or bodily function. Workup: ECG, CXR, CBC, BMP, Troponin Findings: ECG: No overt evidence of STEMI. No evidence of Brugadas sign, delta wave, epsilon wave, significantly prolonged QTc, or malignant arrhythmia HS Troponin: Negative x1 Other Labs unremarkable for emergent problems. CXR: Without PTX, PNA, or widened mediastinum Last Stress Test: Never Last Heart Catheterization: Never HEART Score: 3  Given History, Exam, and Workup I have low suspicion for ACS, Pneumothorax, Pneumonia, Pulmonary Embolus, Tamponade, Aortic Dissection or other emergent problem as a cause for this presentation.   Reassesment: Prior to  discharge patients pain was controlled and they were well appearing.  Disposition:  Discharge. Strict return precautions discussed with patient with full understanding. Advised patient to follow up promptly with primary care provider    FINAL CLINICAL IMPRESSION(S) / ED DIAGNOSES   Final diagnoses:  Intermittent left-sided chest pain  Shortness of breath   Rx / DC Orders   ED Discharge Orders          Ordered    Ambulatory referral to Cardiology       Comments: If you have not heard from the Cardiology office within the next 72 hours please call [phone].   09/12/22 1926           Note:  This document was prepared using Dragon voice recognition software and may include unintentional dictation errors.   Naaman Plummer, MD 09/12/22 [phone]

## 2022-09-12 NOTE — ED Triage Notes (Signed)
Ems report: ems from [facility] for CP x 1 month. Pain was worse today. Staff at [employer] [facility] gave '325mg'$  asp, '25mg'$  metoprolol, .4 nitro.

## 2022-09-12 NOTE — Progress Notes (Signed)
Subjective:  Patient ID: Yolanda Young, female    DOB: 11-13-1966  Age: 56 y.o. MRN: [id number]  CC: The encounter diagnosis was Chest pain, unspecified type.   HPI Yolanda Young presents for regularly scheduled visit with active chest pain.  Has been occurring nearly daily for the last 2 weeks accompanied by diaphoresis,  increasing shortness of breath and radiation of chest pressure to left jaw and left shoulder.  Occurring at rest .  No history of CAD or hypertension   PMH includes  obesity,   fibronyalgia, fatty liver, and IBS>  Last seen in office July 2021.     Outpatient Medications Prior to Visit  Medication Sig Dispense Refill   baclofen (LIORESAL) 10 MG tablet Take 1 tablet (10 mg total) by mouth 3 (three) times daily. 90 each 3   cholestyramine (QUESTRAN) 4 g packet Take 1 packet (4 g total) by mouth 3 (three) times daily. 60 each 1   ibuprofen (ADVIL,MOTRIN) 200 MG tablet Take 600 mg by mouth every 6 (six) hours as needed for moderate pain.     ondansetron (ZOFRAN ODT) 4 MG disintegrating tablet Take 1 tablet (4 mg total) by mouth every 8 (eight) hours as needed for nausea or vomiting. 20 tablet 5   pantoprazole (PROTONIX) 40 MG tablet Take 1 tablet (40 mg total) by mouth daily. 90 tablet 3   propranolol ER (INDERAL LA) 80 MG 24 hr capsule Take 1 capsule (80 mg total) by mouth daily. 30 capsule 5   traMADol (ULTRAM) 50 MG tablet Take 1 tablet (50 mg total) by mouth 2 (two) times daily as needed for up to 60 doses for moderate pain. 60 tablet 5   ursodiol (ACTIGALL) 300 MG capsule Take 1 capsule (300 mg total) by mouth 3 (three) times daily. 90 capsule 2   colestipol (COLESTID) 1 g tablet Take by mouth. (Patient not taking: Reported on 05/21/2022)     No facility-administered medications prior to visit.    Review of Systems;  Patient denies headache, fevers, malaise, unintentional weight loss, skin rash, eye pain, sinus congestion and sinus pain, sore throat, dysphagia,   hemoptysis , cough, orthopnea, edema, abdominal pain, nausea, melena, diarrhea, constipation, flank pain, dysuria, hematuria, urinary  Frequency, nocturia, numbness, tingling, seizures,  Focal weakness, Loss of consciousness,  Tremor, insomnia, depression, anxiety, and suicidal ideation.      Objective:  BP (!) 170/110   Pulse 95   Temp 98.3 F (36.8 C)   Resp 12   Wt 238 lb (108 kg)   SpO2 98%   BMI 36.19 kg/m   BP Readings from Last 3 Encounters:  09/12/22 130/72  09/12/22 (!) 170/110  08/06/22 (!) 131/96    Wt Readings from Last 3 Encounters:  09/12/22 238 lb (108 kg)  08/06/22 235 lb 9.6 oz (106.9 kg)  05/21/22 230 lb (104.3 kg)    General appearance: alert, diaphoretic  cooperative and appears stated age Ears: normal TM's and external ear canals both ears Throat: lips, mucosa, and tongue normal; teeth and gums normal Neck: no adenopathy, no carotid bruit, supple, symmetrical, trachea midline and thyroid not enlarged, symmetric, no tenderness/mass/nodules Back: symmetric, no curvature. ROM normal. No CVA tenderness. Lungs: clear to auscultation bilaterally Heart: regular rate and rhythm, S1, S2 normal, no murmur, click, rub or gallop Abdomen: soft, non-tender; bowel sounds normal; no masses,  no organomegaly Pulses: 2+ and symmetric Skin: Skin color, texture, turgor normal. No rashes or lesions Lymph nodes: Cervical, supraclavicular,  and axillary nodes normal. Neuro:  awake and interactive with normal mood and affect. Higher cortical functions are normal. Speech is clear without word-finding difficulty or dysarthria. Extraocular movements are intact. Visual fields of both eyes are grossly intact. Sensation to light touch is grossly intact bilaterally of upper and lower extremities. Motor examination shows 4+/5 symmetric hand grip and upper extremity and 5/5 lower extremity strength. There is no pronation or drift. Gait is non-ataxic   Lab Results  Component Value Date    HGBA1C 5.9 03/05/2020    Lab Results  Component Value Date   CREATININE 0.93 09/12/2022   CREATININE 0.87 03/05/2020   CREATININE 0.76 04/12/2018    Lab Results  Component Value Date   WBC 10.2 09/12/2022   HGB 14.2 09/12/2022   HCT 43.0 09/12/2022   PLT 290 09/12/2022   GLUCOSE 99 09/12/2022   ALT 20 07/10/2022   AST 18 07/10/2022   NA 137 09/12/2022   K 3.7 09/12/2022   CL 109 09/12/2022   CREATININE 0.93 09/12/2022   BUN 14 09/12/2022   CO2 23 09/12/2022   TSH 2.17 03/05/2020   INR 1.0 08/01/2020   HGBA1C 5.9 03/05/2020    DG FL GUIDED LUMBAR PUNCTURE  Result Date: 09/05/2021 CLINICAL DATA:  White matter abnormality on brain MRI. EXAM: DIAGNOSTIC LUMBAR PUNCTURE UNDER FLUOROSCOPIC GUIDANCE COMPARISON:  None FLUOROSCOPY TIME:  Fluoroscopy Time:  5 seconds Radiation Exposure Index (if provided by the fluoroscopic device): 3.86 microGray*m^2 Number of Acquired Spot Images: 0 PROCEDURE: Informed consent was obtained from the patient prior to the procedure, including potential complications of headache, allergy, and pain. With the patient prone, the lower back was prepped with Betadine. 1% Lidocaine was used for local anesthesia. Lumbar puncture was performed at the L3-4 level using a 3.5 inch 20 gauge needle via a right interlaminar approach with return of clear CSF with an opening pressure of 15 cm water (measured in the left lateral decubitus position). 9 mL of CSF were obtained for laboratory studies. The patient tolerated the procedure well and there were no apparent complications. IMPRESSION: Technically successful fluoroscopically guided lumbar puncture. Electronically Signed   By: Logan Bores M.D.   On: 09/05/2021 10:03    Assessment & Plan:   Problem List Items Addressed This Visit     Chest pain - Primary    Patient descries recurrent pain over the last 2 weeks Yolanda Young is currently reporting SSCP, shortness of breath and diaphoresis . I have ordered and reviewed a 12 lead  EKG and find that there arno acute changes;  She  has ST elevation in lead   III which is new .  She was given 324 mg asa and 25 mg metoprolol in  Office prior to EMS arrival.  patient to be taken to ER for urgent evaluation and management       Relevant Orders   EKG 12-Lead   .  Follow-up: No follow-ups on file.   Crecencio Mc, MD

## 2022-09-12 NOTE — ED Provider Triage Note (Signed)
  Emergency Medicine Provider Triage Evaluation Note  Yolanda Young , a 56 y.o.female,  was evaluated in triage.  Pt complains of chest pain x1 month.  She states that it was particular worse today.  She was evaluated at her regular doctor, was concerned by her symptoms.  I gave her aspirin, 5 mg metoprolol, and 4 nitro.   Review of Systems  Positive: Chest pain Negative: Denies fever, shortness of breath vomiting  Physical Exam   Vitals:   09/12/22 1549  BP: 136/80  Pulse: 74  Resp: 16  Temp: 98.4 F (36.9 C)  SpO2: 97%   Gen:   Awake, no distress   Resp:  Normal effort  MSK:   Moves extremities without difficulty  Other:    Medical Decision Making  Given the patient's initial medical screening exam, the following diagnostic evaluation has been ordered. The patient will be placed in the appropriate treatment space, once one is available, to complete the evaluation and treatment. I have discussed the plan of care with the patient and I have advised the patient that an ED physician or mid-level practitioner will reevaluate their condition after the test results have been received, as the results may give them additional insight into the type of treatment they may need.    Diagnostics: Labs, EKG, CXR  Treatments: none immediately   Teodoro Spray, [place] 09/12/22 1720

## 2022-09-12 NOTE — Assessment & Plan Note (Addendum)
Patient descries recurrent pain over the last 2 weeks [place] is currently reporting SSCP, shortness of breath and diaphoresis . I have ordered and reviewed a 12 lead EKG and find that there arno acute changes;  She  has ST elevation in lead   III which is new .  She was given 324 mg asa and 25 mg metoprolol in  Office prior to EMS arrival.  patient to be taken to ER for urgent evaluation and management

## 2022-09-18 ENCOUNTER — Ambulatory Visit
Admission: EM | Admit: 2022-09-18 | Discharge: 2022-09-18 | Disposition: A | Discharge status: Home / Self Care | Payer: Medicare Other | Attending: Family Medicine | Admitting: Family Medicine

## 2022-09-18 ENCOUNTER — Other Ambulatory Visit: Payer: Self-pay

## 2022-09-18 DIAGNOSIS — J020 Streptococcal pharyngitis: Secondary | ICD-10-CM

## 2022-09-18 DIAGNOSIS — R000 Tachycardia, unspecified: Secondary | ICD-10-CM | POA: Insufficient documentation

## 2022-09-18 DIAGNOSIS — R0602 Shortness of breath: Secondary | ICD-10-CM | POA: Diagnosis not present

## 2022-09-18 DIAGNOSIS — R590 Localized enlarged lymph nodes: Secondary | ICD-10-CM | POA: Insufficient documentation

## 2022-09-18 DIAGNOSIS — Z1152 Encounter for screening for COVID-19: Secondary | ICD-10-CM | POA: Diagnosis not present

## 2022-09-18 LAB — RESP PANEL BY RT-PCR (FLU A&B, COVID) ARPGX2
Influenza A by PCR: NEGATIVE
Influenza B by PCR: NEGATIVE
SARS Coronavirus 2 by RT PCR: NEGATIVE

## 2022-09-18 LAB — GROUP A STREP BY PCR: Group A Strep by PCR: DETECTED — AB

## 2022-09-18 MED ORDER — CLINDAMYCIN HCL 300 MG PO CAPS: 300.0000 mg | ORAL_CAPSULE | Freq: Three times a day (TID) | ORAL | 0 refills | Status: AC

## 2022-09-18 NOTE — ED Triage Notes (Addendum)
Started on Monday with n/v/d which lasted 24 hours. Tuesday started with sore throat, nasal drainage, ear pain, and generalized body aches. Negative COVID tests at home. Several family members have had strep throat

## 2022-09-18 NOTE — Discharge Instructions (Addendum)
Stop by the pharmacy to pick up your prescriptions.  Follow up with your primary care provider as needed.  

## 2022-09-18 NOTE — ED Provider Notes (Signed)
[facility]    CSN: [id number] Arrival date & time: 09/18/22  0920      History   Chief Complaint Chief Complaint  Patient presents with   Generalized Body Aches   Sore Throat    HPI Yolanda Young is a 56 y.o. female.   HPI   Yolanda Young presents for vomiting and diarrhea then started having sore throat, body aches, left ear pain, sneezing and rhinorrhea that started on Monday. She has tried Tylenol and Ibuprofen for headache and painful swallowing. She has been drinking lots of fluids and sleeping. Her cousin and her four nieces had strep.  Abdominal cramping, nausea and diarrhea has been improving.  Has been having intermittent chest pain for the past month. Has been having elevated BP. She saw Dr Derrel Nip after getting an EKG. Has some shortness of breath for "a while." No current chest pain.      Past Medical History:  Diagnosis Date   Colon polyp 02/04/2018   tubular adenoma   GERD (gastroesophageal reflux disease)    History of hiatal hernia    Migraine    weekly   Panic attacks     Patient Active Problem List   Diagnosis Date Noted   Chest pain 09/12/2022   Acute URI of multiple sites 10/06/2020   Barrett's esophagus without dysplasia 07/06/2020   IBS (irritable bowel syndrome) 07/06/2020   Back pain at L4-L5 level 04/03/2020   Hepatic steatosis 04/03/2020   Neuropathy 03/22/2020   Polyarthritis involving elbow 03/06/2020   Choking episode occurring at night 03/06/2020   Excessive body weight gain 03/06/2020   Amenorrhea, secondary 03/06/2020   Shortness of breath on exertion 03/06/2020   Neck pain of over 3 months duration 03/06/2020   Personal history of colonic polyps    Colon cancer screening    Polyp of sigmoid colon    Benign neoplasm of descending colon    Gastritis without bleeding     Past Surgical History:  Procedure Laterality Date   ABLATION ON ENDOMETRIOSIS  2013   [place] N/A 04/05/2018   Procedure:  LAPAROSCOPIC CHOLECYSTECTOMY WITH INTRAOPERATIVE CHOLANGIOGRAM;  Surgeon: Robert Bellow, MD;  Location: [facility];  Service: General;  Laterality: N/A;   COLONOSCOPY WITH PROPOFOL N/A 02/04/2018   Procedure: COLONOSCOPY WITH PROPOFOL;  Surgeon: Lucilla Lame, MD;  Location: [place];  Service: Endoscopy;  Laterality: N/A;   ESOPHAGOGASTRODUODENOSCOPY (EGD) WITH PROPOFOL N/A 02/04/2018   Procedure: ESOPHAGOGASTRODUODENOSCOPY (EGD) WITH PROPOFOL;  Surgeon: Lucilla Lame, MD;  Location: [place];  Service: Endoscopy;  Laterality: N/A;   ESOPHAGOGASTRODUODENOSCOPY (EGD) WITH PROPOFOL N/A 07/03/2020   Procedure: ESOPHAGOGASTRODUODENOSCOPY (EGD) WITH PROPOFOL;  Surgeon: Lesly Rubenstein, MD;  Location: [facility];  Service: Endoscopy;  Laterality: N/A;   SHOULDER SURGERY Right     OB History     Gravida  4   Para      Term      Preterm      AB  4   Living  0      SAB  4   IAB      Ectopic      Multiple      Live Births           Obstetric Comments  1st Menstrual Cycle:  12  1st Pregnancy:  17           Home Medications    Prior to Admission medications   Medication Sig Start Date  End Date Taking? Authorizing Provider  clindamycin (CLEOCIN) 300 MG capsule Take 1 capsule (300 mg total) by mouth 3 (three) times daily for 10 days. 09/18/22 09/28/22 Yes Higinio Grow, DO  baclofen (LIORESAL) 10 MG tablet Take 1 tablet (10 mg total) by mouth 3 (three) times daily. 09/16/21   Pieter Partridge, DO  cholestyramine (QUESTRAN) 4 g packet Take 1 packet (4 g total) by mouth 3 (three) times daily. 05/21/22   Lucilla Lame, MD  colestipol (COLESTID) 1 g tablet Take by mouth. Patient not taking: Reported on 05/21/2022 08/07/20 02/18/22  [provider]  ibuprofen (ADVIL,MOTRIN) 200 MG tablet Take 600 mg by mouth every 6 (six) hours as needed for moderate pain.    [provider]  ondansetron (ZOFRAN ODT) 4 MG disintegrating tablet Take 1  tablet (4 mg total) by mouth every 8 (eight) hours as needed for nausea or vomiting. 02/01/21   Tomi Likens, Adam R, DO  pantoprazole (PROTONIX) 40 MG tablet Take 1 tablet (40 mg total) by mouth daily. 05/21/22   Lucilla Lame, MD  predniSONE (DELTASONE) 50 MG tablet Take 1 tablet (50 mg total) by mouth daily with breakfast. 09/12/22   Cuthriell, Charline Bills, PA-C  propranolol ER (INDERAL LA) 80 MG 24 hr capsule Take 1 capsule (80 mg total) by mouth daily. 08/06/22   Pieter Partridge, DO  traMADol (ULTRAM) 50 MG tablet Take 1 tablet (50 mg total) by mouth 2 (two) times daily as needed for up to 60 doses for moderate pain. 07/04/20   Crecencio Mc, MD  ursodiol (ACTIGALL) 300 MG capsule Take 1 capsule (300 mg total) by mouth 3 (three) times daily. 05/21/22   Lucilla Lame, MD    Family History Family History  Problem Relation Age of Onset   Diabetes Mother    Stroke Father    Crohn's disease Father    Cancer Maternal Grandmother    Cancer Maternal Grandfather    Diabetes Paternal Grandmother     Social History Social History   Tobacco Use   Smoking status: Former    Packs/day: 0.50    Years: 2.00    Total pack years: 1.00    Types: Cigarettes    Quit date: 1999    Years since quitting: 24.7   Smokeless tobacco: Never  Vaping Use   Vaping Use: Never used  Substance Use Topics   Alcohol use: Never    Alcohol/week: 4.0 standard drinks of alcohol    Types: 4 Glasses of wine per week   Drug use: No     Allergies   Azithromycin, Erythromycin, Penicillins, and Prednisone   Review of Systems Review of Systems: negative unless otherwise stated in HPI.      Physical Exam Triage Vital Signs ED Triage Vitals  Enc Vitals Group     BP 09/18/22 0932 (!) 147/77     Pulse Rate 09/18/22 0932 (!) 115     Resp 09/18/22 0932 19     Temp 09/18/22 0932 98.6 F (37 C)     Temp Source 09/18/22 0932 Oral     SpO2 09/18/22 0932 99 %     Weight 09/18/22 0929 238 lb (108 kg)     Height 09/18/22 0929  '5\' 8"'$  (1.727 m)     Head Circumference --      Peak Flow --      Pain Score 09/18/22 0928 10     Pain Loc --      Pain Edu? --  Excl. in GC? --    No data found.  Updated Vital Signs BP (!) 147/77 (BP Location: Right Arm)   Pulse (!) 115   Temp 98.6 F (37 C) (Oral)   Resp 19   Ht '5\' 8"'$  (1.727 m)   Wt 108 kg   SpO2 99%   BMI 36.19 kg/m   Visual Acuity Right Eye Distance:   Left Eye Distance:   Bilateral Distance:    Right Eye Near:   Left Eye Near:    Bilateral Near:     Physical Exam GEN:     alert, non-toxic appearing female in no distress     HENT:  mucus membranes moist, oropharyngeal cancer, erythema, edema with left  exudate 2+ tonsillar hypertrophy, lear nasal discharge,  bilateral TM  normal EYES:   pupils equal and reactive, EOMi ,  no scleral injection NECK:  normal ROM, left anterior lymphadenopathy,  no meningismus   RESP:  no increased work of breathing,  clear to auscultation bilaterally CVS:    Tachycardia, regular rhythm Skin:   warm and dry, no rash on visible skin , normal  skin turgor    UC Treatments / Results  Labs (all labs ordered are listed, but only abnormal results are displayed) Labs Reviewed  GROUP A STREP BY PCR - Abnormal; Notable for the following components:      Result Value   Group A Strep by PCR DETECTED (*)    All other components within normal limits  RESP PANEL BY RT-PCR (FLU A&B, COVID) [id number]    EKG   Radiology No results found.  Procedures Procedures (including critical care time)  Medications Ordered in UC Medications - No data to display  Initial Impression / Assessment and Plan / UC Course  I have reviewed the triage vital signs and the nursing notes.  Pertinent labs & imaging results that were available during my care of the patient were reviewed by me and considered in my medical decision making (see chart for details).       Pt is a 56 y.o. female who presents for 3 days of respiratory and GI  symptoms. Hermena is afebrile here without recent antipyretics. Satting well on room air. Pt is  ill-appearing, tachycardic but well hydrated, without respiratory distress. Pulmonary exam  is unremarkable.  COVID  and influenza testing obtained. Strep PCR positive.  Tylenol/Motrin as needed for discomfort.  Continue gargling with warm salt water.  Recommended to avoid anything that irritates her throat.  Treat with Clindamycin for 10 days.   Discussed MDM, treatment plan and plan for follow-up with patient who agrees with plan.     Final Clinical Impressions(s) / UC Diagnoses   Final diagnoses:  Strep pharyngitis     Discharge Instructions      Stop by the pharmacy to pick up your prescriptions.  Follow up with your primary care provider as needed.      ED Prescriptions     Medication Sig Dispense Auth. Provider   clindamycin (CLEOCIN) 300 MG capsule Take 1 capsule (300 mg total) by mouth 3 (three) times daily for 10 days. 30 capsule Lyndee Hensen, DO      PDMP not reviewed this encounter.   Lyndee Hensen, DO 09/18/22 1023

## 2022-09-23 ENCOUNTER — Ambulatory Visit: Payer: 59 | Admitting: Internal Medicine

## 2022-09-23 ENCOUNTER — Encounter: Payer: Self-pay | Admitting: Internal Medicine

## 2022-09-23 ENCOUNTER — Ambulatory Visit (INDEPENDENT_AMBULATORY_CARE_PROVIDER_SITE_OTHER): Payer: Medicare Other

## 2022-09-23 VITALS — BP 110/66 | HR 87 | Temp 98.0°F | Ht 68.0 in | Wt 235.6 lb

## 2022-09-23 DIAGNOSIS — Z79899 Other long term (current) drug therapy: Secondary | ICD-10-CM

## 2022-09-23 DIAGNOSIS — R7301 Impaired fasting glucose: Secondary | ICD-10-CM | POA: Diagnosis not present

## 2022-09-23 DIAGNOSIS — E785 Hyperlipidemia, unspecified: Secondary | ICD-10-CM | POA: Diagnosis not present

## 2022-09-23 DIAGNOSIS — M797 Fibromyalgia: Secondary | ICD-10-CM

## 2022-09-23 DIAGNOSIS — R079 Chest pain, unspecified: Secondary | ICD-10-CM

## 2022-09-23 DIAGNOSIS — R0609 Other forms of dyspnea: Secondary | ICD-10-CM

## 2022-09-23 DIAGNOSIS — K760 Fatty (change of) liver, not elsewhere classified: Secondary | ICD-10-CM

## 2022-09-23 DIAGNOSIS — M25551 Pain in right hip: Secondary | ICD-10-CM

## 2022-09-23 DIAGNOSIS — R748 Abnormal levels of other serum enzymes: Secondary | ICD-10-CM

## 2022-09-23 DIAGNOSIS — K2270 Barrett's esophagus without dysplasia: Secondary | ICD-10-CM

## 2022-09-23 DIAGNOSIS — Z1231 Encounter for screening mammogram for malignant neoplasm of breast: Secondary | ICD-10-CM

## 2022-09-23 LAB — CBC WITH DIFFERENTIAL/PLATELET
Basophils Absolute: 0.1 10*3/uL (ref 0.0–0.1)
Basophils Relative: 1.3 % (ref 0.0–3.0)
Eosinophils Absolute: 0.1 10*3/uL (ref 0.0–0.7)
Eosinophils Relative: 1.5 % (ref 0.0–5.0)
HCT: 44 % (ref 36.0–46.0)
Hemoglobin: 14.6 g/dL (ref 12.0–15.0)
Lymphocytes Relative: 35.6 % (ref 12.0–46.0)
Lymphs Abs: 2.7 10*3/uL (ref 0.7–4.0)
MCHC: 33.1 g/dL (ref 30.0–36.0)
MCV: 93.5 fl (ref 78.0–100.0)
Monocytes Absolute: 0.5 10*3/uL (ref 0.1–1.0)
Monocytes Relative: 6.8 % (ref 3.0–12.0)
Neutro Abs: 4.2 10*3/uL (ref 1.4–7.7)
Neutrophils Relative %: 54.8 % (ref 43.0–77.0)
Platelets: 300 10*3/uL (ref 150.0–400.0)
RBC: 4.71 Mil/uL (ref 3.87–5.11)
RDW: 13.5 % (ref 11.5–15.5)
WBC: 7.7 10*3/uL (ref 4.0–10.5)

## 2022-09-23 LAB — COMPREHENSIVE METABOLIC PANEL
ALT: 24 U/L (ref 0–35)
AST: 17 U/L (ref 0–37)
Albumin: 4.4 g/dL (ref 3.5–5.2)
Alkaline Phosphatase: 130 U/L — ABNORMAL HIGH (ref 39–117)
BUN: 11 mg/dL (ref 6–23)
CO2: 26 mEq/L (ref 19–32)
Calcium: 9.8 mg/dL (ref 8.4–10.5)
Chloride: 104 mEq/L (ref 96–112)
Creatinine, Ser: 0.82 mg/dL (ref 0.40–1.20)
GFR: 80.84 mL/min (ref >60.00)
Glucose, Bld: 99 mg/dL (ref 70–99)
Potassium: 4.3 mEq/L (ref 3.5–5.1)
Sodium: 138 mEq/L (ref 135–145)
Total Bilirubin: 0.5 mg/dL (ref 0.2–1.2)
Total Protein: 7.1 g/dL (ref 6.0–8.3)

## 2022-09-23 LAB — TSH: TSH: 1.29 u[IU]/mL (ref 0.35–5.50)

## 2022-09-23 LAB — LIPID PANEL
Cholesterol: 239 mg/dL — ABNORMAL HIGH (ref 0–200)
HDL: 69 mg/dL (ref >39.00)
LDL Cholesterol: 149 mg/dL — ABNORMAL HIGH (ref 0–99)
NonHDL: 169.5
Total CHOL/HDL Ratio: 3
Triglycerides: 103 mg/dL (ref 0.0–149.0)
VLDL: 20.6 mg/dL (ref 0.0–40.0)

## 2022-09-23 LAB — LDL CHOLESTEROL, DIRECT: Direct LDL: 164 mg/dL

## 2022-09-23 LAB — HEMOGLOBIN A1C: Hgb A1c MFr Bld: 6.1 % (ref 4.6–6.5)

## 2022-09-23 NOTE — Assessment & Plan Note (Signed)
Diagnosed by rheumatologist in [place] , confirmed with repeat evaluation by [facility] in 2021.  She states that she failed  trials of cymbalta, amitriptyline and gabapentin .  Will obtain PA for [place].  Need  for low impact aerobic exercise stressed ,  Patient has multiple barriers to starting an exercise program currently.  She is scheduled to see Dr Selena Batten on Oct 31 for formal cardiology evaluation of  recurrent exertional chest pain 2) she reports bilateral hip pain and low back pain aggravated by walking., but has not had an orthopedic evaluation  Plain films of hips ordered

## 2022-09-23 NOTE — Progress Notes (Signed)
Subjective:  Patient ID: Yolanda Young, female    DOB: Aug 21, 1966  Age: 56 y.o. MRN: [id number]  CC: The primary encounter diagnosis was Encounter for screening mammogram for malignant neoplasm of breast. Diagnoses of Hyperlipidemia, unspecified hyperlipidemia type, Long-term use of high-risk medication, Impaired fasting glucose, Chest pain, unspecified type, Right hip pain, Barrett's esophagus without dysplasia, Fibromyalgia, Exertional dyspnea, and Hepatic steatosis were also pertinent to this visit.   HPI Yolanda Young presents for follow up on ER evaluation for chest pain and discussion of treatment for fibromyalgia Chief Complaint  Patient presents with   Follow-up    Discuss treatment for fibromyalgia     1) Chest Pain:  presented to office last week for her first visit since July 2021 and was having active chest pain with a  hx of exertional chest pain concerning for [place].  Taken to ER .  Cardiac enzymes were cycled,  EKG and chest x ray were done and normal.  She was released with a referral to cardiology and has an appt on oct 31 with a cardiologist in [facility] Dr. Ellyn Hack . She continues to report chest pain and shortness of breath with minimal activity.  Ambulatory sats were done today in the office and her pulse ox remained at 99%   2) Strep pharyngitis: treated at urgent care for positive strep test on Oct 12    3) Fibromyalgia: last seen in office July 2021.      states that she was very active in high school, and for several years afterward,  developed fatigue,  joint pain , recurrent UTI's  back in 1999 when she was diagnosed with SLE by a rheumatologist in [place].  Was treated  for SLE for at least 8 years,  then she moved to [place] and a new rheumatologist disagreed with the diagnosis of SLE and was told she had FM .Prior trials of amitripytline, cymbalta, and gabapentin were not tolerated or not helpful.  Diagnosis was confirmed by [facility] rheumatology in  2021.  She is disinclined to walk due to hip pain  foot  pain and back pain .  Also limited by shortness of breath with activities, even making her bed makes her short of breath.Marland Kitchen    4) Migraines  sees neurology quarterly ,  next appt jan 2023.    MRI brain was suggestive of MS so she Underwent LP which was nondiagnostic for MS (done because of MRI changes)   5) PBC : she states that she has "primary biliary cirrhosis".  Review of Dr. Haig Prophet notes is NEGATIVE FOR PBC , including a liver biopsy    Outpatient Medications Prior to Visit  Medication Sig Dispense Refill   baclofen (LIORESAL) 10 MG tablet Take 1 tablet (10 mg total) by mouth 3 (three) times daily. 90 each 3   cholestyramine (QUESTRAN) 4 g packet Take 1 packet (4 g total) by mouth 3 (three) times daily. 60 each 1   clindamycin (CLEOCIN) 300 MG capsule Take 1 capsule (300 mg total) by mouth 3 (three) times daily for 10 days. 30 capsule 0   ibuprofen (ADVIL,MOTRIN) 200 MG tablet Take 600 mg by mouth every 6 (six) hours as needed for moderate pain.     ondansetron (ZOFRAN ODT) 4 MG disintegrating tablet Take 1 tablet (4 mg total) by mouth every 8 (eight) hours as needed for nausea or vomiting. 20 tablet 5   pantoprazole (PROTONIX) 40 MG tablet Take 1 tablet (  40 mg total) by mouth daily. 90 tablet 3   propranolol ER (INDERAL LA) 80 MG 24 hr capsule Take 1 capsule (80 mg total) by mouth daily. 30 capsule 5   traMADol (ULTRAM) 50 MG tablet Take 1 tablet (50 mg total) by mouth 2 (two) times daily as needed for up to 60 doses for moderate pain. 60 tablet 5   ursodiol (ACTIGALL) 300 MG capsule Take 1 capsule (300 mg total) by mouth 3 (three) times daily. 90 capsule 2   colestipol (COLESTID) 1 g tablet Take by mouth. (Patient not taking: Reported on 05/21/2022)     predniSONE (DELTASONE) 50 MG tablet Take 1 tablet (50 mg total) by mouth daily with breakfast. (Patient not taking: Reported on 09/23/2022) 5 tablet 0   No facility-administered  medications prior to visit.    Review of Systems;  Patient denies headache, fevers, malaise, unintentional weight loss, skin rash, eye pain, sinus congestion and sinus pain, sore throat, dysphagia,  hemoptysis , cough, dyspnea, wheezing, chest pain, palpitations, orthopnea, edema, abdominal pain, nausea, melena, diarrhea, constipation, flank pain, dysuria, hematuria, urinary  Frequency, nocturia, numbness, tingling, seizures,  Focal weakness, Loss of consciousness,  Tremor, insomnia, depression, anxiety, and suicidal ideation.      Objective:  BP 110/66 (BP Location: Left Arm, Patient Position: Sitting, Cuff Size: Large)   Pulse 87   Temp 98 F (36.7 C) (Oral)   Ht _0  (1.727 m)   Wt 235 lb 9.6 oz (106.9 kg)   SpO2 97%   BMI 35.82 kg/m   BP Readings from Last 3 Encounters:  09/23/22 110/66  09/18/22 (!) 147/77  09/12/22 130/72    Wt Readings from Last 3 Encounters:  09/23/22 235 lb 9.6 oz (106.9 kg)  09/18/22 238 lb (108 kg)  09/12/22 238 lb (108 kg)    General appearance: alert, cooperative and appears stated age Ears: normal TM's and external ear canals both ears Throat: lips, mucosa, and tongue normal; teeth and gums normal Neck: no adenopathy, no carotid bruit, supple, symmetrical, trachea midline and thyroid not enlarged, symmetric, no tenderness/mass/nodules Back: symmetric, no curvature. ROM normal. No CVA tenderness. Lungs: clear to auscultation bilaterally Heart: regular rate and rhythm, S1, S2 normal, no murmur, click, rub or gallop Abdomen: soft, non-tender; bowel sounds normal; no masses,  no organomegaly Pulses: 2+ and symmetric Skin: Skin color, texture, turgor normal. No rashes or lesions Lymph nodes: Cervical, supraclavicular, and axillary nodes normal. Neuro:  awake and interactive with normal mood and affect. Higher cortical functions are normal. Speech is clear without word-finding difficulty or dysarthria. Extraocular movements are intact. Visual  fields of both eyes are grossly intact. Sensation to light touch is grossly intact bilaterally of upper and lower extremities. Motor examination shows 4+/5 symmetric hand grip and upper extremity and 5/5 lower extremity strength. There is no pronation or drift. Gait is non-ataxic   Lab Results  Component Value Date   HGBA1C 5.9 03/05/2020    Lab Results  Component Value Date   CREATININE 0.93 09/12/2022   CREATININE 0.87 03/05/2020   CREATININE 0.76 04/12/2018    Lab Results  Component Value Date   WBC 10.2 09/12/2022   HGB 14.2 09/12/2022   HCT 43.0 09/12/2022   PLT 290 09/12/2022   GLUCOSE 99 09/12/2022   ALT 20 07/10/2022   AST 18 07/10/2022   NA 137 09/12/2022   K 3.7 09/12/2022   CL 109 09/12/2022   CREATININE 0.93 09/12/2022   BUN 14 09/12/2022  CO2 23 09/12/2022   TSH 2.17 03/05/2020   INR 1.0 08/01/2020   HGBA1C 5.9 03/05/2020    No results found.  Assessment & Plan:   Problem List Items Addressed This Visit     Barrett's esophagus without dysplasia    Managed with protonix now twice daily . Last EGD done by [facility]       Chest pain    nevaluated during active episode with no evidence of ischemia by enzymes and EKG.  Chest x ray also normal       Relevant Orders   D-Dimer, Quantitative   Exertional dyspnea    Chronic , Etiology remains to be determined.  It is reassuring that her ambulatory sats are unchanged from 99% , and chest x rya done in ER showed no evidence of lung disease or cardiomegaly.  Given its chronicity,  PE unlikely,  But if D dimer is elevated,  I will order a CTA to rule out thromboembolic disease.       Fibromyalgia    Diagnosed by rheumatologist in [place] , confirmed with repeat evaluation by [facility] in 2021.  She states that she failed  trials of cymbalta, amitriptyline and gabapentin .  Will obtain PA for [place].  Need  for low impact aerobic exercise stressed ,  Patient has multiple barriers to starting an  exercise program currently.  She is scheduled to see Dr Selena Batten on Oct 31 for formal cardiology evaluation of  recurrent exertional chest pain 2) she reports bilateral hip pain and low back pain aggravated by walking., but has not had an orthopedic evaluation  Plain films of hips ordered       Hepatic steatosis    She has no evidence OF PBC by liver biopsy done in 2022.        Other Visit Diagnoses     Encounter for screening mammogram for malignant neoplasm of breast    -  Primary   Relevant Orders   MM 3D SCREEN BREAST BILATERAL   Hyperlipidemia, unspecified hyperlipidemia type       Relevant Orders   Lipid Profile   Direct LDL   Long-term use of high-risk medication       Relevant Orders   Comp Met (CMET)   TSH   CBC with Differential/Platelet   Impaired fasting glucose       Relevant Orders   HgB A1c   Right hip pain       Relevant Orders   DG HIPS BILAT WITH PELVIS 2V       I spent a total of  45 minutes with this patient in a face to face visit on the date of this encounter reviewing the last office visit with me in   July 2021,  most recent visit with Rheumatology and gastroenterology, recent ER visit including labs and imaging studies ,   and post visit ordering of testing and therapeutics.    Follow-up: No follow-ups on file.   Crecencio Mc, MD

## 2022-09-23 NOTE — Assessment & Plan Note (Signed)
Managed with protonix now twice daily . Last EGD done by [facility]

## 2022-09-23 NOTE — Assessment & Plan Note (Signed)
She has no evidence OF PBC by liver biopsy done in 2022.

## 2022-09-23 NOTE — Assessment & Plan Note (Addendum)
Chronic , Etiology remains to be determined.  It is reassuring that her ambulatory sats are unchanged from 99% , and chest x rya done in ER showed no evidence of lung disease or cardiomegaly.  Given its chronicity,  PE unlikely,  But if D dimer is elevated,  I will order a CTA to rule out thromboembolic disease.

## 2022-09-23 NOTE — Patient Instructions (Addendum)
I would like to obtain records from your prior rheumatologist   If the x rays of hip are normal  I will go ahead and petition for [facility] for treatment of Fibromyalgia  Depending on the results of the labs today,  I may order a CT scan of your chest

## 2022-09-23 NOTE — Assessment & Plan Note (Signed)
nevaluated during active episode with no evidence of ischemia by enzymes and EKG.  Chest x ray also normal

## 2022-09-24 DIAGNOSIS — R748 Abnormal levels of other serum enzymes: Secondary | ICD-10-CM | POA: Insufficient documentation

## 2022-09-24 LAB — D-DIMER, QUANTITATIVE: D-Dimer, Quant: 0.47 mcg/mL FEU (ref <0.50)

## 2022-09-24 NOTE — Assessment & Plan Note (Signed)
Currently improving with treatment for PBC by Dr Allen Norris.    Lab Results  Component Value Date   ALT 24 09/23/2022   AST 17 09/23/2022   ALKPHOS 130 (H) 09/23/2022   BILITOT 0.5 09/23/2022

## 2022-10-07 ENCOUNTER — Ambulatory Visit: Payer: 59 | Admitting: Cardiology

## 2022-10-15 ENCOUNTER — Ambulatory Visit
Admission: RE | Admit: 2022-10-15 | Discharge: 2022-10-15 | Disposition: A | Discharge status: Home / Self Care | Payer: Medicare Other | Source: Ambulatory Visit | Attending: Internal Medicine | Admitting: Internal Medicine

## 2022-10-15 DIAGNOSIS — Z1231 Encounter for screening mammogram for malignant neoplasm of breast: Secondary | ICD-10-CM | POA: Diagnosis present

## 2022-10-20 ENCOUNTER — Other Ambulatory Visit: Payer: Self-pay | Admitting: Internal Medicine

## 2022-10-20 DIAGNOSIS — R928 Other abnormal and inconclusive findings on diagnostic imaging of breast: Secondary | ICD-10-CM

## 2022-10-20 DIAGNOSIS — N6489 Other specified disorders of breast: Secondary | ICD-10-CM

## 2022-11-04 ENCOUNTER — Ambulatory Visit
Admission: RE | Admit: 2022-11-04 | Discharge: 2022-11-04 | Disposition: A | Discharge status: Home / Self Care | Payer: Medicare Other | Source: Ambulatory Visit | Attending: Internal Medicine | Admitting: Internal Medicine

## 2022-11-04 DIAGNOSIS — N6489 Other specified disorders of breast: Secondary | ICD-10-CM

## 2022-11-04 DIAGNOSIS — R928 Other abnormal and inconclusive findings on diagnostic imaging of breast: Secondary | ICD-10-CM | POA: Insufficient documentation

## 2022-12-02 ENCOUNTER — Ambulatory Visit (INDEPENDENT_AMBULATORY_CARE_PROVIDER_SITE_OTHER): Payer: Medicare Other

## 2022-12-02 ENCOUNTER — Ambulatory Visit
Admission: EM | Admit: 2022-12-02 | Discharge: 2022-12-02 | Disposition: A | Discharge status: Home / Self Care | Payer: Medicare Other | Attending: Internal Medicine | Admitting: Internal Medicine

## 2022-12-02 ENCOUNTER — Encounter: Payer: Self-pay | Admitting: Emergency Medicine

## 2022-12-02 DIAGNOSIS — J209 Acute bronchitis, unspecified: Secondary | ICD-10-CM | POA: Diagnosis not present

## 2022-12-02 MED ORDER — ALBUTEROL SULFATE HFA 108 (90 BASE) MCG/ACT IN AERS: 2.0000 | INHALATION_SPRAY | Freq: Every evening | RESPIRATORY_TRACT | 0 refills | Status: DC | PRN

## 2022-12-02 MED ORDER — HYDROCODONE BIT-HOMATROP MBR 5-1.5 MG/5ML PO SOLN: 5.0000 mL | Freq: Four times a day (QID) | ORAL | 0 refills | Status: DC | PRN

## 2022-12-02 MED ORDER — DOXYCYCLINE HYCLATE 100 MG PO CAPS: 100.0000 mg | ORAL_CAPSULE | Freq: Two times a day (BID) | ORAL | 0 refills | Status: DC

## 2022-12-02 NOTE — ED Triage Notes (Signed)
Pt presents with cough and congestion off and on for several weeks. Pt was seen 09/18/2022 and states she has not been better since that time .

## 2022-12-02 NOTE — ED Provider Notes (Signed)
[facility]    CSN: [id number] Arrival date & time: 12/02/22  7342      History   Chief Complaint Chief Complaint  Patient presents with   Cough   Nasal Congestion    HPI Yolanda Young is a 56 y.o. female presents with unresolved intermittent cough  and nose congestion since seen here on 09/18/22. In the past 10 days has been having episodes of cough attack with purulent mucous production. Has not had fevers. Has some nose congestion but this is better.     Past Medical History:  Diagnosis Date   Colon polyp 02/04/2018   tubular adenoma   GERD (gastroesophageal reflux disease)    History of hiatal hernia    Migraine    weekly   Panic attacks     Patient Active Problem List   Diagnosis Date Noted   Elevated alkaline phosphatase measurement 09/24/2022   Fibromyalgia 09/23/2022   Chest pain 09/12/2022   Barrett's esophagus without dysplasia 07/06/2020   IBS (irritable bowel syndrome) 07/06/2020   Back pain at L4-L5 level 04/03/2020   Hepatic steatosis 04/03/2020   Neuropathy 03/22/2020   Polyarthritis involving elbow 03/06/2020   Excessive body weight gain 03/06/2020   Amenorrhea, secondary 03/06/2020   Exertional dyspnea 03/06/2020   Neck pain of over 3 months duration 03/06/2020   Personal history of colonic polyps    Colon cancer screening    Polyp of sigmoid colon    Benign neoplasm of descending colon    Gastritis without bleeding     Past Surgical History:  Procedure Laterality Date   ABLATION ON ENDOMETRIOSIS  2013   [place] N/A 04/05/2018   Procedure: LAPAROSCOPIC CHOLECYSTECTOMY WITH INTRAOPERATIVE CHOLANGIOGRAM;  Surgeon: Robert Bellow, MD;  Location: [facility];  Service: General;  Laterality: N/A;   COLONOSCOPY WITH PROPOFOL N/A 02/04/2018   Procedure: COLONOSCOPY WITH PROPOFOL;  Surgeon: Lucilla Lame, MD;  Location: [facility];  Service: Endoscopy;  Laterality: N/A;   ESOPHAGOGASTRODUODENOSCOPY (EGD)  WITH PROPOFOL N/A 02/04/2018   Procedure: ESOPHAGOGASTRODUODENOSCOPY (EGD) WITH PROPOFOL;  Surgeon: Lucilla Lame, MD;  Location: [facility];  Service: Endoscopy;  Laterality: N/A;   ESOPHAGOGASTRODUODENOSCOPY (EGD) WITH PROPOFOL N/A 07/03/2020   Procedure: ESOPHAGOGASTRODUODENOSCOPY (EGD) WITH PROPOFOL;  Surgeon: Lesly Rubenstein, MD;  Location: [facility];  Service: Endoscopy;  Laterality: N/A;   SHOULDER SURGERY Right     OB History     Gravida  4   Para      Term      Preterm      AB  4   Living  0      SAB  4   IAB      Ectopic      Multiple      Live Births           Obstetric Comments  1st Menstrual Cycle:  12  1st Pregnancy:  17           Home Medications    Prior to Admission medications   Medication Sig Start Date End Date Taking? Authorizing Provider  albuterol (VENTOLIN HFA) 108 (90 Base) MCG/ACT inhaler Inhale 2 puffs into the lungs at bedtime as needed for wheezing or shortness of breath. Cough attacks 12/02/22  Yes Rodriguez-Southworth, Sunday Spillers, PA-C  doxycycline (VIBRAMYCIN) 100 MG capsule Take 1 capsule (100 mg total) by mouth 2 (two) times daily. 12/02/22  Yes Rodriguez-Southworth, Sunday Spillers, PA-C  HYDROcodone bit-homatropine (HYCODAN) 5-1.5 MG/5ML syrup Take  5 mLs by mouth every 6 (six) hours as needed for cough. 12/02/22  Yes Rodriguez-Southworth, Sunday Spillers, PA-C  baclofen (LIORESAL) 10 MG tablet Take 1 tablet (10 mg total) by mouth 3 (three) times daily. 09/16/21   Pieter Partridge, DO  cholestyramine (QUESTRAN) 4 g packet Take 1 packet (4 g total) by mouth 3 (three) times daily. 05/21/22   Lucilla Lame, MD  ibuprofen (ADVIL,MOTRIN) 200 MG tablet Take 600 mg by mouth every 6 (six) hours as needed for moderate pain.    [provider]  ondansetron (ZOFRAN ODT) 4 MG disintegrating tablet Take 1 tablet (4 mg total) by mouth every 8 (eight) hours as needed for nausea or vomiting. 02/01/21   Tomi Likens, Adam R, DO  pantoprazole (PROTONIX)  40 MG tablet Take 1 tablet (40 mg total) by mouth daily. 05/21/22   Lucilla Lame, MD  propranolol ER (INDERAL LA) 80 MG 24 hr capsule Take 1 capsule (80 mg total) by mouth daily. 08/06/22   Pieter Partridge, DO  traMADol (ULTRAM) 50 MG tablet Take 1 tablet (50 mg total) by mouth 2 (two) times daily as needed for up to 60 doses for moderate pain. 07/04/20   Crecencio Mc, MD  ursodiol (ACTIGALL) 300 MG capsule Take 1 capsule (300 mg total) by mouth 3 (three) times daily. 05/21/22   Lucilla Lame, MD    Family History Family History  Problem Relation Age of Onset   Diabetes Mother    Stroke Father    Crohn's disease Father    Cancer Maternal Grandmother    Cancer Maternal Grandfather    Diabetes Paternal Grandmother     Social History Social History   Tobacco Use   Smoking status: Former    Packs/day: 0.50    Years: 2.00    Total pack years: 1.00    Types: Cigarettes    Quit date: 1999    Years since quitting: 25.0   Smokeless tobacco: Never  Vaping Use   Vaping Use: Never used  Substance Use Topics   Alcohol use: Never    Alcohol/week: 4.0 standard drinks of alcohol    Types: 4 Glasses of wine per week   Drug use: No     Allergies   Azithromycin, Erythromycin, Penicillins, and Prednisone   Review of Systems Review of Systems  Constitutional:  Positive for fatigue. Negative for appetite change, chills, diaphoresis and fever.  HENT:  Positive for congestion. Negative for ear discharge, ear pain, postnasal drip and rhinorrhea.   Respiratory:  Positive for cough. Negative for chest tightness, shortness of breath and wheezing.   Cardiovascular:  Negative for chest pain.  Hematological:  Negative for adenopathy.     Physical Exam Triage Vital Signs ED Triage Vitals  Enc Vitals Group     BP 12/02/22 0952 (!) 137/97     Pulse Rate 12/02/22 0952 (!) 102     Resp 12/02/22 0952 18     Temp 12/02/22 0952 98.4 F (36.9 C)     Temp Source 12/02/22 0952 Oral     SpO2 12/02/22  0952 97 %     Weight --      Height --      Head Circumference --      Peak Flow --      Pain Score 12/02/22 0951 5     Pain Loc --      Pain Edu? --      Excl. in [place]? --    No data found.  Updated Vital Signs BP (!) 137/97 (BP Location: Left Arm)   Pulse (!) 102   Temp 98.4 F (36.9 C) (Oral)   Resp 18   SpO2 97%   Visual Acuity Right Eye Distance:   Left Eye Distance:   Bilateral Distance:    Right Eye Near:   Left Eye Near:    Bilateral Near:      Physical Exam Constitutional:      General: He is not in acute distress.    Appearance: He is not toxic-appearing.  HENT:     Head: Normocephalic.     Right Ear: Tympanic membrane, ear canal and external ear normal.     Left Ear: Ear canal and external ear normal.     Nose: Nose normal.     Mouth/Throat:     Mouth: Mucous membranes are moist.     Pharynx: Oropharynx is clear.  Eyes:     General: No scleral icterus.    Conjunctiva/sclera: Conjunctivae normal.  Cardiovascular:     Rate and Rhythm: Normal rate and regular rhythm.     Heart sounds: No murmur heard.   Pulmonary:     Effort: Pulmonary effort is normal. No respiratory distress. Deep breaths provoked cough    Breath sounds: clear    Comments: Has auditory wheezing from her throat heard before lung exam.  Musculoskeletal:        General: Normal range of motion.     Cervical back: Neck supple.  Lymphadenopathy:     Cervical: No cervical adenopathy.  Skin:    General: Skin is warm and dry.     Findings: No rash.  Neurological:     Mental Status: He is alert and oriented to person, place, and time.     Gait: Gait normal.  Psychiatric:        Mood and Affect: Mood normal.        Behavior: Behavior normal.        Thought Content: Thought content normal.        Judgment: Judgment normal.    UC Treatments / Results  Labs (all labs ordered are listed, but only abnormal results are displayed) Labs Reviewed - No data to  display  EKG   Radiology DG Chest 2 View  Result Date: 12/02/2022 CLINICAL DATA:  Cough EXAM: CHEST - 2 VIEW COMPARISON:  09/12/2022 FINDINGS: The heart size and mediastinal contours are within normal limits. Both lungs are clear. The visualized skeletal structures are unremarkable. IMPRESSION: No active cardiopulmonary disease. Electronically Signed   By: Davina Poke D.O.   On: 12/02/2022 10:21    Procedures Procedures (including critical care time)  Medications Ordered in UC Medications - No data to display  Initial Impression / Assessment and Plan / UC Course  I have reviewed the triage vital signs and the nursing notes.  Pertinent  imaging results that were available during my care of the patient were reviewed by me and considered in my medical decision making (see chart for details).  Acute bronchitis  I placed her on Albuterol, Hycodan and Doxy as noted.    Final Clinical Impressions(s) / UC Diagnoses   Final diagnoses:  Acute bronchitis, unspecified organism   Discharge Instructions   None    ED Prescriptions     Medication Sig Dispense Auth. Provider   doxycycline (VIBRAMYCIN) 100 MG capsule Take 1 capsule (100 mg total) by mouth 2 (two) times daily. 20 capsule Rodriguez-Southworth, Paeton Studer, PA-C   HYDROcodone bit-homatropine (HYCODAN) 5-1.5  MG/5ML syrup Take 5 mLs by mouth every 6 (six) hours as needed for cough. 120 mL Rodriguez-Southworth, Vitali Seibert, PA-C   albuterol (VENTOLIN HFA) 108 (90 Base) MCG/ACT inhaler Inhale 2 puffs into the lungs at bedtime as needed for wheezing or shortness of breath. Cough attacks 18 g Rodriguez-Southworth, Sunday Spillers, PA-C      PDMP not reviewed this encounter.   Shelby Mattocks, PA-C 12/02/22 1035

## 2022-12-31 NOTE — Progress Notes (Deleted)
NEUROLOGY FOLLOW UP OFFICE NOTE  Yolanda Young [id number]  Assessment/Plan:   Migraine without aura, without status migrainosus, not intractable Abnormal white matter lesions on brain MRI.  Workup (including imaging of C-spine and CSF analysis) were negative.   Paresthesias and muscle spasms - unclear etiology.  Workup negative.  May be manifestation of her fibromyalgia.   Episodic confusion - unclear etiology   Migraine prevention:  *** already tried Aimovig, propranolol, topiramate, and amitriptyline) Migraine rescue:  *** Limit use of pain relievers to no more than 2 days out of week to prevent risk of rebound or medication-overuse headache. Keep headache diary Advised to follow up with PCP to discuss possible referral to pain management for her chronic pain. Follow up ***     Subjective:  Yolanda Young is a 57 year old right-handed female with hepatic steatosis and fibromyalgia who follows up for migraines and paresthesias.   UPDATE: Increased propranolol.   Yolanda Young *** Intensity:  moderate  Duration:  1 day (followed by 1 day of postdrome headache) with sumatriptan Frequency:  twice a week.    Current NSAIDS/analgesics:  Tramadol, ibuprofen  Current triptans:  none Current ergotamine:  none Current anti-emetic:  Zofran '4mg'$  Current muscle relaxants:  baclofen '10mg'$  TID Current Antihypertensive medications:  propranolol ER '80mg'$  daily Current Antidepressant medications:  none Current Anticonvulsant medications:  none Current anti-CGRP:  Ubrelvy '100mg'$  *** Current Vitamins/Herbal/Supplements:  Melatonin 10-'20mg'$  at night Current Antihistamines/Decongestants:  noen Other therapy:  none Hormone/birth control:  none   Caffeine:  1 cup of coffee a day (down from 4 cups) Diet:  Not hydrates enough.  Skips meals Exercise:  Not recently Depression:  no; Anxiety:  Yes.  Panic attacks Other pain:  fibromyalgia - notes significant increase in low back pain and right hip  pain Sleep:  Poor.  Insomnia   HISTORY: She reports headaches since childhood.  She has visual aura of splotchy and tunnel vision and left sided facial tingling followed by severe left sided pounding headache.  Nausea, vomiting, photophobia, phonophobia, osmophobia, and generalized weakness.  They severe ones last 2 days followed by fatigue for a couple of days afterwards.  Moderate headaches last a day (ocular migraines with mild headache).  Migraines occur 20-24 days a month, 15 are severe.  Triggers include diet soft drinks, stress, bright lights.  Resting in a dark room helps.  She has neck pain.  Cervical X-ray from 03/05/2020 personally reviewed showed diffuse multilevel degenerative changes with mild straightening of the cervical spine.  Currently not treating     She reports constant pins and needles sensation in the arms and legs since 2008.  She has low back pain radiating down the left posterior leg.  Thyroid panel was normal, B12 350 and Hgb A1c 5.9.  She underwent autoimmune workup.  She had a positive ANA 1/320 homo and sed rate 51, but ENA, RF, C3, C4, CCP antibodies, anticardiolipin IgG, lupus anticoagulant,  beta-2 glycoprotein and UPEP were normal, indeterminate ACL IgM of 17 and polyclonal immunoglobulin.  CK 55.  X-ray of bilateral hips normal.  X-ray of lumbar spine showed anterior osteophyte at T12-L1 and normal SI joints.  She was seen by rheumatology who diagnosed fibromyalgia.  NCV-EMG on 03/19/2021 demonstrated mild right carpal tunnel syndrome but no polyneuropathy.  She had imaging on 08/30/2021.[facility]  MRI of brain with and without contrast showed scattered hyperintense T2 foci in the bilateral periatrial white matter and superior left frontal lobe without abnormal enhancement.  MRI of cervical spine with and without contrast showed mild noncompressive disc bulging at C4-5 through C6-7 but no spinal cord abnormality.  She underwent lumbar puncture on 09/05/2021 which demonstrated cell count  0, protein 31, glucose 54, negative culture, negative cytology,IgG index 0.43, and 0 bands.   She has had two episodes where she was driving on a familiar route and suddenly was disoriented and didn't know where she was.  It lasted a minute. One time it was followed by a mild headache.  EEG on 02/24/2022 which was normal.      Past NSAIDS/analgesics:  Meloxicam, Excedrin, Fioricet Past abortive triptans:  Maxalt, sumatriptan tab Past abortive ergotamine:  none Past muscle relaxants:  tizanidine Past anti-emetic:  none Past antihypertensive medications:  none Past antidepressant medications:  amitriptyline Past anticonvulsant medications:  topiramate Past anti-CGRP:  Aimovig (ineffective) Past vitamins/Herbal/Supplements:  none Past antihistamines/decongestants:  none Other past therapies:  none     Family history of headache:  Both grandmothers and aunts  PAST MEDICAL HISTORY: Past Medical History:  Diagnosis Date   Colon polyp 02/04/2018   tubular adenoma   GERD (gastroesophageal reflux disease)    History of hiatal hernia    Migraine    weekly   Panic attacks     MEDICATIONS: Current Outpatient Medications on File Prior to Visit  Medication Sig Dispense Refill   baclofen (LIORESAL) 10 MG tablet Take 1 tablet (10 mg total) by mouth 3 (three) times daily. 90 each 3   cholestyramine (QUESTRAN) 4 g packet Take 1 packet (4 g total) by mouth 3 (three) times daily. 60 each 1   ibuprofen (ADVIL,MOTRIN) 200 MG tablet Take 600 mg by mouth every 6 (six) hours as needed for moderate pain.     ondansetron (ZOFRAN ODT) 4 MG disintegrating tablet Take 1 tablet (4 mg total) by mouth every 8 (eight) hours as needed for nausea or vomiting. 20 tablet 5   pantoprazole (PROTONIX) 40 MG tablet Take 1 tablet (40 mg total) by mouth daily. 90 tablet 3   traMADol (ULTRAM) 50 MG tablet Take 1 tablet (50 mg total) by mouth 2 (two) times daily as needed for up to 60 doses for moderate pain. 60 tablet 5    ursodiol (ACTIGALL) 300 MG capsule Take 1 capsule (300 mg total) by mouth 3 (three) times daily. 90 capsule 2   colestipol (COLESTID) 1 g tablet Take by mouth. (Patient not taking: Reported on 05/21/2022)     No current facility-administered medications on file prior to visit.     ALLERGIES: Allergies  Allergen Reactions   Azithromycin Rash   Erythromycin Rash   Penicillins Rash    Has patient had a PCN reaction causing immediate rash, facial/tongue/throat swelling, SOB or lightheadedness with hypotension: No Has patient had a PCN reaction causing severe rash involving mucus membranes or skin necrosis: Yes Has patient had a PCN reaction that required hospitalization: No Has patient had a PCN reaction occurring within the last 10 years: No If all of the above answers are "NO", then may proceed with Cephalosporin use.    Prednisone Nausea And Vomiting    FAMILY HISTORY: Family History  Problem Relation Age of Onset   Diabetes Mother    Stroke Father    Crohn's disease Father    Cancer Maternal Grandmother    Cancer Maternal Grandfather    Diabetes Paternal Grandmother       Objective:  *** General: No acute distress.  Patient appears well-groomed.  Head:  Normocephalic/atraumatic Neck:  supple, full range of motion, bilateral paraspinal tenderness Heart:  RRR Back:  bilateral paraspinal tenderness Neuro: ***    Metta Clines, DO  CC: Deborra Medina, MD

## 2023-01-02 ENCOUNTER — Other Ambulatory Visit: Payer: Self-pay | Admitting: *Deleted

## 2023-01-02 ENCOUNTER — Telehealth: Payer: Self-pay | Admitting: *Deleted

## 2023-01-02 ENCOUNTER — Ambulatory Visit: Payer: 59 | Admitting: Neurology

## 2023-01-02 DIAGNOSIS — Z86010 Personal history of colonic polyps: Secondary | ICD-10-CM

## 2023-01-02 MED ORDER — NA SULFATE-K SULFATE-MG SULF 17.5-3.13-1.6 GM/177ML PO SOLN: 1.0000 | Freq: Once | ORAL | 0 refills | Status: AC

## 2023-01-02 NOTE — Telephone Encounter (Signed)
Gastroenterology Pre-Procedure Review  Request Date: 03/02/2023 Requesting Physician: Dr. Allen Norris  PATIENT REVIEW QUESTIONS: The patient responded to the following health history questions as indicated:    1. Are you having any GI issues? yes (GERD) 2. Do you have a personal history of Polyps? yes (02/04/2018) 3. Do you have a family history of Colon Cancer or Polyps? yes (father) 4. Diabetes Mellitus? no 5. Joint replacements in the past 12 months?no 6. Major health problems in the past 3 months?no 7. Any artificial heart valves, MVP, or defibrillator?no    MEDICATIONS & ALLERGIES:    Patient reports the following regarding taking any anticoagulation/antiplatelet therapy:   Plavix, Coumadin, Eliquis, Xarelto, Lovenox, Pradaxa, Brilinta, or Effient? no Aspirin? no  Patient confirms/reports the following medications:  Current Outpatient Medications  Medication Sig Dispense Refill   albuterol (VENTOLIN HFA) 108 (90 Base) MCG/ACT inhaler Inhale 2 puffs into the lungs at bedtime as needed for wheezing or shortness of breath. Cough attacks 18 g 0   baclofen (LIORESAL) 10 MG tablet Take 1 tablet (10 mg total) by mouth 3 (three) times daily. 90 each 3   cholestyramine (QUESTRAN) 4 g packet Take 1 packet (4 g total) by mouth 3 (three) times daily. 60 each 1   doxycycline (VIBRAMYCIN) 100 MG capsule Take 1 capsule (100 mg total) by mouth 2 (two) times daily. 20 capsule 0   HYDROcodone bit-homatropine (HYCODAN) 5-1.5 MG/5ML syrup Take 5 mLs by mouth every 6 (six) hours as needed for cough. 120 mL 0   ibuprofen (ADVIL,MOTRIN) 200 MG tablet Take 600 mg by mouth every 6 (six) hours as needed for moderate pain.     ondansetron (ZOFRAN ODT) 4 MG disintegrating tablet Take 1 tablet (4 mg total) by mouth every 8 (eight) hours as needed for nausea or vomiting. 20 tablet 5   pantoprazole (PROTONIX) 40 MG tablet Take 1 tablet (40 mg total) by mouth daily. 90 tablet 3   propranolol ER (INDERAL LA) 80 MG 24 hr  capsule Take 1 capsule (80 mg total) by mouth daily. 30 capsule 5   traMADol (ULTRAM) 50 MG tablet Take 1 tablet (50 mg total) by mouth 2 (two) times daily as needed for up to 60 doses for moderate pain. 60 tablet 5   ursodiol (ACTIGALL) 300 MG capsule Take 1 capsule (300 mg total) by mouth 3 (three) times daily. 90 capsule 2   No current facility-administered medications for this visit.    Patient confirms/reports the following allergies:  Allergies  Allergen Reactions   Azithromycin Rash   Erythromycin Rash   Penicillins Rash    Has patient had a PCN reaction causing immediate rash, facial/tongue/throat swelling, SOB or lightheadedness with hypotension: No Has patient had a PCN reaction causing severe rash involving mucus membranes or skin necrosis: Yes Has patient had a PCN reaction that required hospitalization: No Has patient had a PCN reaction occurring within the last 10 years: No If all of the above answers are "NO", then may proceed with Cephalosporin use.    Prednisone Nausea And Vomiting    No orders of the defined types were placed in this encounter.   AUTHORIZATION INFORMATION Primary Insurance: 1D#: Group #:  Secondary Insurance: 1D#: Group #:  SCHEDULE INFORMATION: Date: 03/02/2023 Time: Location: [facility]

## 2023-01-08 ENCOUNTER — Encounter: Payer: Self-pay | Admitting: Internal Medicine

## 2023-01-08 ENCOUNTER — Telehealth: Payer: Self-pay

## 2023-01-08 DIAGNOSIS — M797 Fibromyalgia: Secondary | ICD-10-CM

## 2023-01-08 NOTE — Telephone Encounter (Signed)
Pended for approval.

## 2023-01-08 NOTE — Telephone Encounter (Signed)
Rheumatology referral in progress

## 2023-01-09 ENCOUNTER — Encounter: Payer: Self-pay | Admitting: Internal Medicine

## 2023-01-15 ENCOUNTER — Telehealth: Payer: Self-pay

## 2023-01-15 ENCOUNTER — Other Ambulatory Visit: Payer: Self-pay

## 2023-01-15 DIAGNOSIS — M797 Fibromyalgia: Secondary | ICD-10-CM

## 2023-01-15 NOTE — Telephone Encounter (Signed)
LMTCB

## 2023-01-15 NOTE — Telephone Encounter (Signed)
Crecencio Mc, MD  [facility]  Please let patient know that neither physician she requested treats fibromyalgia,  so they have declined to see her.  I recommend she schedule an appt with me to discuss any future referrals

## 2023-01-19 NOTE — Telephone Encounter (Signed)
Pt returned call. Note below was read to her. Pt aware. I scheduled her for 2/20 @2$ :30pm with [place].

## 2023-01-19 NOTE — Telephone Encounter (Signed)
noted 

## 2023-01-27 ENCOUNTER — Encounter: Payer: Self-pay | Admitting: Internal Medicine

## 2023-01-27 ENCOUNTER — Ambulatory Visit (INDEPENDENT_AMBULATORY_CARE_PROVIDER_SITE_OTHER): Payer: Medicare Other | Admitting: Internal Medicine

## 2023-01-27 VITALS — BP 122/86 | HR 116 | Temp 98.8°F | Ht 68.0 in | Wt 245.4 lb

## 2023-01-27 DIAGNOSIS — G959 Disease of spinal cord, unspecified: Secondary | ICD-10-CM | POA: Diagnosis not present

## 2023-01-27 DIAGNOSIS — M542 Cervicalgia: Secondary | ICD-10-CM

## 2023-01-27 DIAGNOSIS — R202 Paresthesia of skin: Secondary | ICD-10-CM

## 2023-01-27 DIAGNOSIS — R200 Anesthesia of skin: Secondary | ICD-10-CM

## 2023-01-27 DIAGNOSIS — M5412 Radiculopathy, cervical region: Secondary | ICD-10-CM | POA: Insufficient documentation

## 2023-01-27 DIAGNOSIS — M5442 Lumbago with sciatica, left side: Secondary | ICD-10-CM

## 2023-01-27 DIAGNOSIS — M6281 Muscle weakness (generalized): Secondary | ICD-10-CM

## 2023-01-27 DIAGNOSIS — R7301 Impaired fasting glucose: Secondary | ICD-10-CM | POA: Diagnosis not present

## 2023-01-27 DIAGNOSIS — G8929 Other chronic pain: Secondary | ICD-10-CM

## 2023-01-27 DIAGNOSIS — M545 Low back pain, unspecified: Secondary | ICD-10-CM

## 2023-01-27 MED ORDER — GABAPENTIN 100 MG PO CAPS: 100.0000 mg | ORAL_CAPSULE | Freq: Three times a day (TID) | ORAL | 3 refills | Status: DC

## 2023-01-27 MED ORDER — PREDNISONE 10 MG PO TABS: ORAL_TABLET | ORAL | 0 refills | Status: DC

## 2023-01-27 NOTE — Patient Instructions (Addendum)
I have made a neurology referral along with an MRI of your cervical spine  I am prescribing prednisone 60 mg daily x 3,  then reduce by 10 mg daily until gone.  Start tomorrow   I am prescribing gabapentin to help with your nerve pain.  You can start with 100 mg at bedtime  and either increase the dose at bedtime up to 300 mg gradually OR:  you can add a morning and afternoon dose if needed  If you tolerate this medication and find that it helps your pain , we can increase the dose gradually as needed , up to 2400 mg daily    The most common side effects are fluid retention and dizziness.

## 2023-01-27 NOTE — Progress Notes (Signed)
Subjective:  Patient ID: Yolanda Young, female    DOB: 28-Aug-1966  Age: 57 y.o. MRN: [id number]  CC: The primary encounter diagnosis was Neck pain of over 3 months duration. Diagnoses of Back pain at L4-L5 level, Cervical myelopathy with cervical radiculopathy (HCC), Chronic 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, Muscle weakness of lower extremity, Numbness and tingling in both hands, and Impaired fasting glucose were also pertinent to this visit.   HPI [name] presents for  Chief Complaint  Patient presents with   Medical Management of Chronic Issues    Discuss the need for a referral to neurology    57 yr old female last seen October 2023 presents for discussion of referral for fibromyalgia   Symptoms : subjective numbness in hands and feet occurring intermittently throughout the day,  hands go numb at night while sleeping ,  while driving , she is  able to feel hot or cold.  Accompanied by loss of strength in hands , dropping dishes and cups.  Able to comb hair  but has constant neck and upper back pain .  Describing electrical shocks going down her spine  occurring on a daily basis  for the past 3 week.  Can't get comfortable except lying supine with heating pad,  and arms   at side.  No recent fall  or trauma  has never seen a chiropractor.  Prior imaging of cervical spine  as follows (ordered by Dr Tomi Likens):  MPRESSION: 1. Normal MRI appearance of the cervical spinal cord, with no evidence for demyelinating disease. No abnormal enhancement. 2. Mild noncompressive disc bulging at C4-5 through C6-7 without stenosis or impingement.  She underwent an LP due to concern for MS from lesions seen on Brain .  LP was negative for MS.  Has not seen a neurologist . EMG/[place] studies in April showed mild CTS   This past week had  stabbing pain on the left  side of  scalp in a C3 distribution lasted 5-6 hours  Has been using baclofen  to help her sleep (requires 2) , heating pad.   Has not used prednisone since October  2023 ER visit for left sided chest pain .   She continues to have episodes  of chest pain occurring 2/week that lead to tightness of entire torso accompanied by shortness of breath.    She has chronic low back pain described as a spasm ,  frequently has pain that shoots down her left leg if she bends over to shave her legs.  Has seen Dr Tomi Likens for symptoms    For the last several weeks she has intermittent feelings of "stepping in water"      Outpatient Medications Prior to Visit  Medication Sig Dispense Refill   baclofen (LIORESAL) 10 MG tablet Take 1 tablet (10 mg total) by mouth 3 (three) times daily. 90 each 3   cholestyramine (QUESTRAN) 4 g packet Take 1 packet (4 g total) by mouth 3 (three) times daily. 60 each 1   ibuprofen (ADVIL,MOTRIN) 200 MG tablet Take 600 mg by mouth every 6 (six) hours as needed for moderate pain.     ondansetron (ZOFRAN ODT) 4 MG disintegrating tablet Take 1 tablet (4 mg total) by mouth every 8 (eight) hours as needed for nausea or vomiting. 20 tablet 5   pantoprazole (PROTONIX) 40 MG tablet Take 1 tablet (40 mg total) by mouth daily. 90 tablet 3   propranolol ER (INDERAL LA) 80  MG 24 hr capsule Take 1 capsule (80 mg total) by mouth daily. 30 capsule 5   traMADol (ULTRAM) 50 MG tablet Take 1 tablet (50 mg total) by mouth 2 (two) times daily as needed for up to 60 doses for moderate pain. 60 tablet 5   ursodiol (ACTIGALL) 300 MG capsule Take 1 capsule (300 mg total) by mouth 3 (three) times daily. 90 capsule 2   albuterol (VENTOLIN HFA) 108 (90 Base) MCG/ACT inhaler Inhale 2 puffs into the lungs at bedtime as needed for wheezing or shortness of breath. Cough attacks (Patient not taking: Reported on 01/27/2023) 18 g 0   doxycycline (VIBRAMYCIN) 100 MG capsule Take 1 capsule (100 mg total) by mouth 2 (two) times daily. (Patient not taking: Reported on 01/27/2023) 20 capsule 0   HYDROcodone bit-homatropine (HYCODAN) 5-1.5 MG/5ML  syrup Take 5 mLs by mouth every 6 (six) hours as needed for cough. (Patient not taking: Reported on 01/27/2023) 120 mL 0   No facility-administered medications prior to visit.    Review of Systems;  Patient denies headache, fevers, malaise, unintentional weight loss, skin rash, eye pain, sinus congestion and sinus pain, sore throat, dysphagia,  hemoptysis , cough, dyspnea, wheezing, chest pain, palpitations, orthopnea, edema, abdominal pain, nausea, melena, diarrhea, constipation, flank pain, dysuria, hematuria, urinary  Frequency, nocturia, numbness, tingling, seizures,  Focal weakness, Loss of consciousness,  Tremor, insomnia, depression, anxiety, and suicidal ideation.      Objective:  BP 122/86   Pulse (!) 116   Temp 98.8 F (37.1 C) (Oral)   Ht 5' 8"$  (1.727 m)   Wt 245 lb 6.4 oz (111.3 kg)   SpO2 97%   BMI 37.31 kg/m   BP Readings from Last 3 Encounters:  01/27/23 122/86  12/02/22 (!) 137/97  09/23/22 110/66    Wt Readings from Last 3 Encounters:  01/27/23 245 lb 6.4 oz (111.3 kg)  09/23/22 235 lb 9.6 oz (106.9 kg)  09/18/22 238 lb (108 kg)    Physical Exam Vitals reviewed.  Constitutional:      General: She is not in acute distress.    Appearance: Normal appearance. She is normal weight. She is not ill-appearing, toxic-appearing or diaphoretic.  HENT:     Head: Normocephalic.  Eyes:     General: No scleral icterus.       Right eye: No discharge.        Left eye: No discharge.     Conjunctiva/sclera: Conjunctivae normal.  Cardiovascular:     Rate and Rhythm: Normal rate and regular rhythm.     Heart sounds: Normal heart sounds.  Pulmonary:     Effort: Pulmonary effort is normal. No respiratory distress.     Breath sounds: Normal breath sounds.  Musculoskeletal:        General: Normal range of motion.  Skin:    General: Skin is warm and dry.  Neurological:     Mental Status: She is alert and oriented to person, place, and time. Mental status is at baseline.      Motor: Weakness present.     Comments: Bilateral LE weakness with left foot drop and left sided sciatica  Bilateral  hand grip is weak, left worse than right ,   notes pain radiating to middle finger   Psychiatric:        Mood and Affect: Mood normal.        Behavior: Behavior normal.        Thought Content: Thought content normal.  Judgment: Judgment normal.    Lab Results  Component Value Date   HGBA1C 6.1 09/23/2022   HGBA1C 5.9 03/05/2020    Lab Results  Component Value Date   CREATININE 0.82 09/23/2022   CREATININE 0.93 09/12/2022   CREATININE 0.87 03/05/2020    Lab Results  Component Value Date   WBC 7.7 09/23/2022   HGB 14.6 09/23/2022   HCT 44.0 09/23/2022   PLT 300.0 09/23/2022   GLUCOSE 99 09/23/2022   CHOL 239 (H) 09/23/2022   TRIG 103.0 09/23/2022   HDL 69.00 09/23/2022   LDLDIRECT 164.0 09/23/2022   LDLCALC 149 (H) 09/23/2022   ALT 24 09/23/2022   AST 17 09/23/2022   NA 138 09/23/2022   K 4.3 09/23/2022   CL 104 09/23/2022   CREATININE 0.82 09/23/2022   BUN 11 09/23/2022   CO2 26 09/23/2022   TSH 1.29 09/23/2022   INR 1.0 08/01/2020   HGBA1C 6.1 09/23/2022    DG Chest 2 View  Result Date: 12/02/2022 CLINICAL DATA:  Cough EXAM: CHEST - 2 VIEW COMPARISON:  09/12/2022 FINDINGS: The heart size and mediastinal contours are within normal limits. Both lungs are clear. The visualized skeletal structures are unremarkable. IMPRESSION: No active cardiopulmonary disease. Electronically Signed   By: Davina Poke D.O.   On: 12/02/2022 10:21    Assessment & Plan:  .Neck pain of over 3 months duration -     Ambulatory referral to Neurology -     [place]; Future  Back pain at L4-L5 level Assessment & Plan: Positive straight leg lift on the left with bilateral weakened dorsiflexors.  Referral to neurology, EMG [place] studies,  cervical spine MRI ordered.  Steroid taper and gabapentin prescribed   Orders: -     Ambulatory  referral to Neurology  Cervical myelopathy with cervical radiculopathy ([place]) -     Ambulatory referral to Neurology -     predniSONE; 6 tablets daily for 3 days, then reduce by 1 tablet daily until gone  Dispense: 33 tablet; Refill: 0 -     Gabapentin; Take 1 capsule (100 mg total) by mouth 3 (three) times daily.  Dispense: 90 capsule; Refill: 3 -     MR CERVICAL SPINE WO CONTRAST; Future -     Sedimentation rate -     C-reactive protein  Chronic 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 Assessment & Plan: Positive straight leg lift on the left with bilateral weakened dorsiflexors.  Referral to neurology, EMG [place] studies,  cervical spine MRI ordered.  Steroid taper and gabapentin prescribed   Orders: -     Ambulatory referral to Neurology  Muscle weakness of lower extremity -     Ambulatory referral to Neurology -     predniSONE; 6 tablets daily for 3 days, then reduce by 1 tablet daily until gone  Dispense: 33 tablet; Refill: 0 -     Gabapentin; Take 1 capsule (100 mg total) by mouth 3 (three) times daily.  Dispense: 90 capsule; Refill: 3  Numbness and tingling in both hands Assessment & Plan: EMG studies and MRI cervical spine were done in 2021.  Mild CTS on the right. , and disk herniation without foraminal or spinal stenosis was noted. Symptoms have worsened since then and she has weakness bilaterally, decreased grip, wrist flexion and pain radiating to left middle finger.  Cervical spine MRI , EMG/[place] sutdies,  A1c.  B12 and thyroid function ordered.   Orders: -  B12 and Folate Panel -     TSH  Impaired fasting glucose -     Hemoglobin A1c -     Comprehensive metabolic panel     I provided 40 minutes of face-to-face time during this encounter reviewing patient's last visit with me, patient's  most recent visit with neurosurgery,  recent surgical and non surgical procedures, previous  labs and imaging studies, counseling on currently addressed issues,  and post visit  ordering to diagnostics and therapeutics .   Follow-up: Return in about 4 weeks (around 02/24/2023).   Crecencio Mc, MD

## 2023-01-27 NOTE — Assessment & Plan Note (Signed)
EMG studies and MRI cervical spine were done in 2021.  Mild CTS on the right. , and disk herniation without foraminal or spinal stenosis was noted. Symptoms have worsened since then and she has weakness bilaterally, decreased grip, wrist flexion and pain radiating to left middle finger.  Cervical spine MRI , EMG/[place] sutdies,  A1c.  B12 and thyroid function ordered.

## 2023-01-27 NOTE — Assessment & Plan Note (Signed)
Positive straight leg lift on the left with bilateral weakened dorsiflexors.  Referral to neurology, EMG [place] studies,  cervical spine MRI ordered.  Steroid taper and gabapentin prescribed

## 2023-01-28 LAB — B12 AND FOLATE PANEL
Folate: 12.8 ng/mL (ref >5.9)
Vitamin B-12: 305 pg/mL (ref 211–911)

## 2023-01-28 LAB — TSH: TSH: 1.57 u[IU]/mL (ref 0.35–5.50)

## 2023-01-28 LAB — SEDIMENTATION RATE: Sed Rate: 42 mm/hr — ABNORMAL HIGH (ref 0–30)

## 2023-01-28 LAB — COMPREHENSIVE METABOLIC PANEL
ALT: 30 U/L (ref 0–35)
AST: 21 U/L (ref 0–37)
Albumin: 4.6 g/dL (ref 3.5–5.2)
Alkaline Phosphatase: 128 U/L — ABNORMAL HIGH (ref 39–117)
BUN: 16 mg/dL (ref 6–23)
CO2: 21 mEq/L (ref 19–32)
Calcium: 10.2 mg/dL (ref 8.4–10.5)
Chloride: 104 mEq/L (ref 96–112)
Creatinine, Ser: 1.07 mg/dL (ref 0.40–1.20)
GFR: 58.6 mL/min — ABNORMAL LOW (ref >60.00)
Glucose, Bld: 105 mg/dL — ABNORMAL HIGH (ref 70–99)
Potassium: 4.3 mEq/L (ref 3.5–5.1)
Sodium: 140 mEq/L (ref 135–145)
Total Bilirubin: 0.5 mg/dL (ref 0.2–1.2)
Total Protein: 7.5 g/dL (ref 6.0–8.3)

## 2023-01-28 LAB — C-REACTIVE PROTEIN: CRP: 1.1 mg/dL (ref 0.5–20.0)

## 2023-01-28 LAB — HEMOGLOBIN A1C: Hgb A1c MFr Bld: 6.3 % (ref 4.6–6.5)

## 2023-01-30 ENCOUNTER — Encounter: Payer: Self-pay | Admitting: Internal Medicine

## 2023-01-31 ENCOUNTER — Other Ambulatory Visit: Payer: Self-pay | Admitting: Internal Medicine

## 2023-01-31 DIAGNOSIS — R944 Abnormal results of kidney function studies: Secondary | ICD-10-CM

## 2023-02-09 ENCOUNTER — Encounter: Payer: Self-pay | Admitting: Internal Medicine

## 2023-02-10 ENCOUNTER — Encounter: Payer: Self-pay | Admitting: Internal Medicine

## 2023-02-24 ENCOUNTER — Ambulatory Visit: Payer: Medicare Other | Admitting: Internal Medicine

## 2023-02-25 ENCOUNTER — Other Ambulatory Visit: Payer: Self-pay

## 2023-02-25 ENCOUNTER — Encounter: Payer: Self-pay | Admitting: Gastroenterology

## 2023-03-09 ENCOUNTER — Ambulatory Visit: Payer: Medicare Other | Admitting: Anesthesiology

## 2023-03-09 ENCOUNTER — Encounter: Payer: Self-pay | Admitting: Gastroenterology

## 2023-03-09 ENCOUNTER — Other Ambulatory Visit: Payer: Self-pay

## 2023-03-09 ENCOUNTER — Encounter
Admission: RE | Disposition: A | Discharge status: Home / Self Care | Payer: Self-pay | Source: Ambulatory Visit | Attending: Gastroenterology

## 2023-03-09 ENCOUNTER — Ambulatory Visit
Admission: RE | Admit: 2023-03-09 | Discharge: 2023-03-09 | Disposition: A | Discharge status: Home / Self Care | Payer: Medicare Other | Source: Ambulatory Visit | Attending: Gastroenterology | Admitting: Gastroenterology

## 2023-03-09 DIAGNOSIS — Z1211 Encounter for screening for malignant neoplasm of colon: Secondary | ICD-10-CM | POA: Diagnosis present

## 2023-03-09 DIAGNOSIS — K219 Gastro-esophageal reflux disease without esophagitis: Secondary | ICD-10-CM | POA: Insufficient documentation

## 2023-03-09 DIAGNOSIS — Z8719 Personal history of other diseases of the digestive system: Secondary | ICD-10-CM | POA: Insufficient documentation

## 2023-03-09 DIAGNOSIS — E669 Obesity, unspecified: Secondary | ICD-10-CM | POA: Diagnosis not present

## 2023-03-09 DIAGNOSIS — K640 First degree hemorrhoids: Secondary | ICD-10-CM | POA: Diagnosis not present

## 2023-03-09 DIAGNOSIS — Z86010 Personal history of colonic polyps: Secondary | ICD-10-CM | POA: Insufficient documentation

## 2023-03-09 DIAGNOSIS — Z8379 Family history of other diseases of the digestive system: Secondary | ICD-10-CM | POA: Insufficient documentation

## 2023-03-09 DIAGNOSIS — Z87891 Personal history of nicotine dependence: Secondary | ICD-10-CM | POA: Insufficient documentation

## 2023-03-09 DIAGNOSIS — Z6836 Body mass index (BMI) 36.0-36.9, adult: Secondary | ICD-10-CM | POA: Insufficient documentation

## 2023-03-09 DIAGNOSIS — K635 Polyp of colon: Secondary | ICD-10-CM | POA: Diagnosis not present

## 2023-03-09 DIAGNOSIS — D125 Benign neoplasm of sigmoid colon: Secondary | ICD-10-CM | POA: Insufficient documentation

## 2023-03-09 HISTORY — PX: COLONOSCOPY WITH PROPOFOL: SHX5780

## 2023-03-09 HISTORY — DX: Cervicalgia: M54.2

## 2023-03-09 HISTORY — DX: Dizziness and giddiness: R42

## 2023-03-09 SURGERY — COLONOSCOPY WITH PROPOFOL
Anesthesia: General | Site: Rectum

## 2023-03-09 MED ORDER — LACTATED RINGERS IV SOLN: INTRAVENOUS | Status: DC

## 2023-03-09 MED ORDER — SODIUM CHLORIDE 0.9 % IV SOLN: INTRAVENOUS | Status: DC

## 2023-03-09 MED ORDER — STERILE WATER FOR IRRIGATION IR SOLN
Status: DC | PRN
  Administered 2023-03-09: 1000 mL

## 2023-03-09 MED ORDER — PROPOFOL 10 MG/ML IV BOLUS
INTRAVENOUS | Status: DC | PRN
  Administered 2023-03-09: 50 mg via INTRAVENOUS
  Administered 2023-03-09 (×2): 25 mg via INTRAVENOUS
  Administered 2023-03-09: 100 mg via INTRAVENOUS
  Administered 2023-03-09: 50 mg via INTRAVENOUS

## 2023-03-09 SURGICAL SUPPLY — 22 items
CLIP HMST 235XBRD CATH ROT (MISCELLANEOUS) IMPLANT
CLIP HMST11XOPN 235X2.8X (MISCELLANEOUS) IMPLANT
CLIP RESOLUTION 360 11X235 (MISCELLANEOUS) ×1
ELECT REM PT RETURN 9FT ADLT (ELECTROSURGICAL)
ELECTRODE REM PT RTRN 9FT ADLT (ELECTROSURGICAL) IMPLANT
FORCEPS BIOP RAD 4 LRG CAP 4 (CUTTING FORCEPS) IMPLANT
GOWN CVR UNV OPN BCK APRN NK (MISCELLANEOUS) ×2 IMPLANT
GOWN ISOL THUMB LOOP REG UNIV (MISCELLANEOUS) ×2
INJECTOR VARIJECT VIN23 (MISCELLANEOUS) IMPLANT
KIT DEFENDO VALVE AND CONN (KITS) IMPLANT
KIT PRC NS LF DISP ENDO (KITS) ×1 IMPLANT
KIT PROCEDURE OLYMPUS (KITS) ×1
MANIFOLD NEPTUNE II (INSTRUMENTS) ×1 IMPLANT
MARKER SPOT ENDO TATTOO 5ML (MISCELLANEOUS) IMPLANT
PROBE APC STR FIRE (PROBE) IMPLANT
RETRIEVER NET ROTH 2.5X230 LF (MISCELLANEOUS) IMPLANT
SNARE COLD EXACTO (MISCELLANEOUS) IMPLANT
SNARE SHORT THROW 13M SML OVAL (MISCELLANEOUS) IMPLANT
SNARE SNG USE RND 15MM (INSTRUMENTS) IMPLANT
TRAP ETRAP POLY (MISCELLANEOUS) IMPLANT
VARIJECT INJECTOR VIN23 (MISCELLANEOUS)
WATER STERILE IRR 250ML POUR (IV SOLUTION) ×1 IMPLANT

## 2023-03-09 NOTE — Op Note (Signed)
[facility] Gastroenterology Patient Name: Yolanda Young Procedure Date: 03/09/2023 11:13 AM MRN: [id number] Account #: [id number] Date of Birth: August 21, 1966 Admit Type: Outpatient Age: 57 Room: [facility] OR ROOM 01 Gender: Female Note Status: Finalized Instrument Name: [id number] Procedure:             Colonoscopy Indications:           High risk colon cancer surveillance: Personal history                         of colonic polyps Providers:             Lucilla Lame MD, MD Referring MD:          Deborra Medina, MD (Referring MD) Medicines:             Propofol per Anesthesia Complications:         No immediate complications. Procedure:             Pre-Anesthesia Assessment:                        - Prior to the procedure, a History and Physical was                         performed, and patient medications and allergies were                         reviewed. The patient's tolerance of previous                         anesthesia was also reviewed. The risks and benefits                         of the procedure and the sedation options and risks                         were discussed with the patient. All questions were                         answered, and informed consent was obtained. Prior                         Anticoagulants: The patient has taken no anticoagulant                         or antiplatelet agents. ASA Grade Assessment: II - A                         patient with mild systemic disease. After reviewing                         the risks and benefits, the patient was deemed in                         satisfactory condition to undergo the procedure.                        After obtaining informed consent, the colonoscope was  passed under direct vision. Throughout the procedure,                         the patient's blood pressure, pulse, and oxygen                         saturations were monitored continuously. The                          Colonoscope was introduced through the anus and                         advanced to the the cecum, identified by appendiceal                         orifice and ileocecal valve. The colonoscopy was                         performed without difficulty. The patient tolerated                         the procedure well. The quality of the bowel                         preparation was excellent. Findings:      The perianal and digital rectal examinations were normal.      An 8 mm polyp was found in the sigmoid colon. The polyp was       pedunculated. The polyp was removed with a cold snare. Resection and       retrieval were complete. To prevent bleeding post-intervention, one       hemostatic clip was successfully placed (MR conditional). Clip       manufacturer: [employer].      Non-bleeding internal hemorrhoids were found during retroflexion. The       hemorrhoids were Grade I (internal hemorrhoids that do not prolapse). Impression:            - One 8 mm polyp in the sigmoid colon, removed with a                         cold snare. Resected and retrieved. Clip (MR                         conditional) was placed. Clip manufacturer: [place] internal hemorrhoids. Recommendation:        - Discharge patient to home.                        - Resume previous diet.                        - Continue present medications.                        - Await pathology results.                        -  If the pathology report reveals adenomatous tissue,                         then repeat the colonoscopy for surveillance in 7                         years. Procedure Code(s):     --- Professional ---                        [phone], Colonoscopy, flexible; with removal of                         tumor(s), polyp(s), or other lesion(s) by snare                         technique Diagnosis Code(s):     --- Professional ---                         Z86.010, Personal history of colonic polyps                        D12.5, Benign neoplasm of sigmoid colon CPT copyright 2022 American Medical Association. All rights reserved. The codes documented in this report are preliminary and upon coder review may  be revised to meet current compliance requirements. Lucilla Lame MD, MD 03/09/2023 11:40:53 AM This report has been signed electronically. Number of Addenda: 0 Note Initiated On: 03/09/2023 11:13 AM Scope Withdrawal Time: 0 hours 7 minutes 17 seconds  Total Procedure Duration: 0 hours 14 minutes 47 seconds  Estimated Blood Loss:  Estimated blood loss: none.      [facility]

## 2023-03-09 NOTE — Addendum Note (Signed)
Addendum  created 03/09/23 1442 by Helayne Seminole, MD   Attestation recorded in [place], [place] filed

## 2023-03-09 NOTE — Transfer of Care (Signed)
Immediate Anesthesia Transfer of Care Note  Patient: Yolanda Young  Procedure(s) Performed: COLONOSCOPY WITH PROPOFOL (Rectum)  Patient Location: PACU  Anesthesia Type: General  Level of Consciousness: awake, alert  and patient cooperative  Airway and Oxygen Therapy: Patient Spontanous Breathing and Patient connected to supplemental oxygen  Post-op Assessment: Post-op Vital signs reviewed, Patient's Cardiovascular Status Stable, Respiratory Function Stable, Patent Airway and No signs of Nausea or vomiting  Post-op Vital Signs: Reviewed and stable  Complications: No notable events documented.

## 2023-03-09 NOTE — H&P (Signed)
Lucilla Lame, MD [address] [place], [place] [address] Phone:[phone] Fax : [phone]  Primary Care Physician:  Crecencio Mc, MD Primary Gastroenterologist:  Dr. Allen Norris  Pre-Procedure History & Physical: HPI:  Yolanda Young is a 57 y.o. female is here for an endoscopy.   Past Medical History:  Diagnosis Date   Colon polyp 02/04/2018   tubular adenoma   GERD (gastroesophageal reflux disease)    History of hiatal hernia    Migraine    weekly   Neck pain    Panic attacks    Vertigo     Past Surgical History:  Procedure Laterality Date   ABLATION ON ENDOMETRIOSIS  2013   [place]   CHOLECYSTECTOMY N/A 04/05/2018   Procedure: LAPAROSCOPIC CHOLECYSTECTOMY WITH INTRAOPERATIVE CHOLANGIOGRAM;  Surgeon: Robert Bellow, MD;  Location: [facility];  Service: General;  Laterality: N/A;   COLONOSCOPY WITH PROPOFOL N/A 02/04/2018   Procedure: COLONOSCOPY WITH PROPOFOL;  Surgeon: Lucilla Lame, MD;  Location: [place];  Service: Endoscopy;  Laterality: N/A;   ESOPHAGOGASTRODUODENOSCOPY (EGD) WITH PROPOFOL N/A 02/04/2018   Procedure: ESOPHAGOGASTRODUODENOSCOPY (EGD) WITH PROPOFOL;  Surgeon: Lucilla Lame, MD;  Location: [place];  Service: Endoscopy;  Laterality: N/A;   ESOPHAGOGASTRODUODENOSCOPY (EGD) WITH PROPOFOL N/A 07/03/2020   Procedure: ESOPHAGOGASTRODUODENOSCOPY (EGD) WITH PROPOFOL;  Surgeon: Lesly Rubenstein, MD;  Location: [facility];  Service: Endoscopy;  Laterality: N/A;   SHOULDER SURGERY Right     Prior to Admission medications   Medication Sig Start Date End Date Taking? Authorizing Provider  baclofen (LIORESAL) 10 MG tablet Take 1 tablet (10 mg total) by mouth 3 (three) times daily. 09/16/21  Yes Tomi Likens, Adam R, DO  cholestyramine (QUESTRAN) 4 g packet Take 1 packet (4 g total) by mouth 3 (three) times daily. 05/21/22  Yes Lucilla Lame, MD  gabapentin (NEURONTIN) 100 MG capsule Take 1 capsule (100 mg total) by mouth 3  (three) times daily. 01/27/23  Yes Crecencio Mc, MD  ibuprofen (ADVIL,MOTRIN) 200 MG tablet Take 600 mg by mouth every 6 (six) hours as needed for moderate pain.   Yes [provider]  pantoprazole (PROTONIX) 40 MG tablet Take 1 tablet (40 mg total) by mouth daily. 05/21/22  Yes Lucilla Lame, MD  propranolol ER (INDERAL LA) 80 MG 24 hr capsule Take 1 capsule (80 mg total) by mouth daily. 08/06/22  Yes Jaffe, Adam R, DO  traMADol (ULTRAM) 50 MG tablet Take 1 tablet (50 mg total) by mouth 2 (two) times daily as needed for up to 60 doses for moderate pain. 07/04/20  Yes Crecencio Mc, MD  ondansetron (ZOFRAN ODT) 4 MG disintegrating tablet Take 1 tablet (4 mg total) by mouth every 8 (eight) hours as needed for nausea or vomiting. Patient not taking: Reported on 02/25/2023 02/01/21   Pieter Partridge, DO  predniSONE (DELTASONE) 10 MG tablet 6 tablets daily for 3 days, then reduce by 1 tablet daily until gone Patient not taking: Reported on 02/25/2023 01/27/23   Crecencio Mc, MD  ursodiol (ACTIGALL) 300 MG capsule Take 1 capsule (300 mg total) by mouth 3 (three) times daily. Patient not taking: Reported on 02/25/2023 05/21/22   Lucilla Lame, MD    Allergies as of 01/02/2023 - Review Complete 12/02/2022  Allergen Reaction Noted   Azithromycin Rash 09/07/2012   Erythromycin Rash 06/20/2016   Penicillins Rash 06/20/2016   Prednisone Nausea And Vomiting 07/02/2020    Family History  Problem Relation Age of  Onset   Diabetes Mother    Stroke Father    Crohn's disease Father    Cancer Maternal Grandmother    Cancer Maternal Grandfather    Diabetes Paternal Grandmother     Social History   Socioeconomic History   Marital status: Divorced    Spouse name: Not on file   Number of children: Not on file   Years of education: Not on file   Highest education level: Not on file  Occupational History   Not on file  Tobacco Use   Smoking status: Former    Packs/day: 0.50    Years: 2.00     Additional pack years: 0.00    Total pack years: 1.00    Types: Cigarettes    Quit date: 1999    Years since quitting: 25.2   Smokeless tobacco: Never  Vaping Use   Vaping Use: Never used  Substance and Sexual Activity   Alcohol use: Not Currently    Alcohol/week: 4.0 standard drinks of alcohol    Types: 4 Glasses of wine per week   Drug use: No   Sexual activity: Yes  Other Topics Concern   Not on file  Social History Narrative   Lives at home alone   Social Determinants of Health   Financial Resource Strain: Not on file  Food Insecurity: Not on file  Transportation Needs: Not on file  Physical Activity: Not on file  Stress: Not on file  Social Connections: Not on file  Intimate Partner Violence: Not on file    Review of Systems: See HPI, otherwise negative ROS  Physical Exam: BP 138/81   Pulse 94   Temp 98.4 F (36.9 C) (Temporal)   Resp 18   Ht 5\' 8"  (1.727 m)   Wt 108.9 kg   LMP 03/29/2013 (Approximate)   SpO2 96%   BMI 36.49 kg/m  General:   Alert,  pleasant and cooperative in NAD Head:  Normocephalic and atraumatic. Neck:  Supple; no masses or thyromegaly. Lungs:  Clear throughout to auscultation.    Heart:  Regular rate and rhythm. Abdomen:  Soft, nontender and nondistended. Normal bowel sounds, without guarding, and without rebound.   Neurologic:  Alert and  oriented x4;  grossly normal neurologically.  Impression/Plan: Yolanda Young is here for an endoscopy to be performed for a history of adenomatous polyps on 2019  Risks, benefits, limitations, and alternatives regarding  colonoscopy have been reviewed with the patient.  Questions have been answered.  All parties agreeable.   Lucilla Lame, MD  03/09/2023, 10:45 AM

## 2023-03-09 NOTE — Anesthesia Preprocedure Evaluation (Addendum)
Anesthesia Evaluation  Patient identified by MRN, date of birth, ID band Patient awake    Airway Mallampati: III  TM Distance: >3 FB Neck ROM: Full    Dental no notable dental hx.    Pulmonary former smoker   breath sounds clear to auscultation       Cardiovascular Exercise Tolerance: Good  Rhythm:Regular Rate:Normal     Neuro/Psych  Headaches Patient is on inderal for migraines  Neuromuscular disease    GI/Hepatic Neg liver ROS, hiatal hernia,GERD  Poorly Controlled,,  Endo/Other  negative endocrine ROS    Renal/GU negative Renal ROS     Musculoskeletal  (+) Arthritis , Osteoarthritis,  Fibromyalgia -  Abdominal  (+) + obese  Peds  Hematology negative hematology ROS (+)   Anesthesia Other Findings   Reproductive/Obstetrics                             Anesthesia Physical Anesthesia Plan  ASA: 2  Anesthesia Plan: General   Post-op Pain Management:    Induction:   PONV Risk Score and Plan: TIVA  Airway Management Planned: Nasal Cannula  Additional Equipment:   Intra-op Plan:   Post-operative Plan:   Informed Consent: I have reviewed the patients History and Physical, chart, labs and discussed the procedure including the risks, benefits and alternatives for the proposed anesthesia with the patient or authorized representative who has indicated his/her understanding and acceptance.       Plan Discussed with: CRNA  Anesthesia Plan Comments:        Anesthesia Quick Evaluation

## 2023-03-09 NOTE — Anesthesia Postprocedure Evaluation (Signed)
Anesthesia Post Note  Patient: Yolanda Young  Procedure(s) Performed: COLONOSCOPY WITH PROPOFOL (Rectum)  Patient location during evaluation: Phase II Anesthesia Type: General Level of consciousness: awake and alert Pain management: satisfactory to patient Vital Signs Assessment: post-procedure vital signs reviewed and stable Respiratory status: spontaneous breathing Postop Assessment: able to ambulate Anesthetic complications: no   No notable events documented.   Last Vitals:  Vitals:   03/09/23 1150 03/09/23 1155  BP:  (!) 130/96  Pulse: (!) 104 88  Resp: (!) 23 13  Temp:  36.7 C  SpO2: 95% 98%    Last Pain:  Vitals:   03/09/23 1155  TempSrc:   PainSc: 0-No pain                 Jetty Berland C Crosby Bevan

## 2023-03-10 ENCOUNTER — Encounter: Payer: Self-pay | Admitting: Gastroenterology

## 2023-03-11 LAB — SURGICAL PATHOLOGY

## 2023-03-12 ENCOUNTER — Telehealth: Payer: Self-pay | Admitting: Internal Medicine

## 2023-03-12 ENCOUNTER — Encounter: Payer: Self-pay | Admitting: Gastroenterology

## 2023-03-12 NOTE — Telephone Encounter (Signed)
Contacted Yolanda Young to schedule their annual wellness visit. Welcome to Medicare visit Due by 09/08/2023.  Thank you,  [place] Direct dial  [phone]

## 2023-03-31 ENCOUNTER — Ambulatory Visit: Payer: Medicare Other | Admitting: Internal Medicine

## 2023-04-10 ENCOUNTER — Encounter: Payer: Self-pay | Admitting: Internal Medicine

## 2023-04-10 DIAGNOSIS — Z1231 Encounter for screening mammogram for malignant neoplasm of breast: Secondary | ICD-10-CM

## 2023-04-13 ENCOUNTER — Other Ambulatory Visit: Payer: Self-pay | Admitting: Internal Medicine

## 2023-04-13 ENCOUNTER — Ambulatory Visit: Payer: Medicare Other | Admitting: Internal Medicine

## 2023-04-13 DIAGNOSIS — N6489 Other specified disorders of breast: Secondary | ICD-10-CM

## 2023-04-13 DIAGNOSIS — R928 Other abnormal and inconclusive findings on diagnostic imaging of breast: Secondary | ICD-10-CM

## 2023-05-06 ENCOUNTER — Other Ambulatory Visit: Payer: Medicare Other

## 2023-05-06 ENCOUNTER — Ambulatory Visit
Admission: RE | Admit: 2023-05-06 | Discharge: 2023-05-06 | Disposition: A | Discharge status: Home / Self Care | Payer: Medicare Other | Source: Ambulatory Visit | Attending: Internal Medicine | Admitting: Internal Medicine

## 2023-05-06 DIAGNOSIS — R928 Other abnormal and inconclusive findings on diagnostic imaging of breast: Secondary | ICD-10-CM | POA: Diagnosis present

## 2023-05-06 DIAGNOSIS — N6489 Other specified disorders of breast: Secondary | ICD-10-CM | POA: Insufficient documentation

## 2023-06-01 ENCOUNTER — Ambulatory Visit (INDEPENDENT_AMBULATORY_CARE_PROVIDER_SITE_OTHER): Payer: Medicare Other | Admitting: Internal Medicine

## 2023-06-01 VITALS — BP 128/92 | HR 88 | Temp 99.0°F | Ht 68.0 in | Wt 245.4 lb

## 2023-06-01 DIAGNOSIS — G8929 Other chronic pain: Secondary | ICD-10-CM

## 2023-06-01 DIAGNOSIS — G959 Disease of spinal cord, unspecified: Secondary | ICD-10-CM

## 2023-06-01 DIAGNOSIS — M5412 Radiculopathy, cervical region: Secondary | ICD-10-CM

## 2023-06-01 DIAGNOSIS — R202 Paresthesia of skin: Secondary | ICD-10-CM

## 2023-06-01 DIAGNOSIS — R768 Other specified abnormal immunological findings in serum: Secondary | ICD-10-CM

## 2023-06-01 DIAGNOSIS — K760 Fatty (change of) liver, not elsewhere classified: Secondary | ICD-10-CM

## 2023-06-01 DIAGNOSIS — R748 Abnormal levels of other serum enzymes: Secondary | ICD-10-CM

## 2023-06-01 DIAGNOSIS — M7918 Myalgia, other site: Secondary | ICD-10-CM | POA: Diagnosis not present

## 2023-06-01 DIAGNOSIS — R200 Anesthesia of skin: Secondary | ICD-10-CM

## 2023-06-01 DIAGNOSIS — R7301 Impaired fasting glucose: Secondary | ICD-10-CM

## 2023-06-01 DIAGNOSIS — M5442 Lumbago with sciatica, left side: Secondary | ICD-10-CM

## 2023-06-01 DIAGNOSIS — R635 Abnormal weight gain: Secondary | ICD-10-CM

## 2023-06-01 DIAGNOSIS — M5432 Sciatica, left side: Secondary | ICD-10-CM

## 2023-06-01 MED ORDER — PREGABALIN 50 MG PO CAPS: 50.0000 mg | ORAL_CAPSULE | Freq: Three times a day (TID) | ORAL | 0 refills | Status: DC

## 2023-06-01 MED ORDER — PREDNISONE 10 MG PO TABS: ORAL_TABLET | ORAL | 0 refills | Status: DC

## 2023-06-01 MED ORDER — BACLOFEN 10 MG PO TABS: 10.0000 mg | ORAL_TABLET | Freq: Three times a day (TID) | ORAL | 3 refills | Status: AC

## 2023-06-01 NOTE — Patient Instructions (Signed)
I am prescribing a prolonged prednisone taper and Lyrica to help manage your pain   Once you receive the lyrica  STOP THE GABAPENTIN  I HAVE ORDERED AN MRI OF THE LUMBAR SPINE

## 2023-06-01 NOTE — Progress Notes (Unsigned)
Subjective:  Patient ID: Yolanda Young, female    DOB: 1966-11-02  Age: 57 y.o. MRN: [id number]  CC: There were no encounter diagnoses.   HPI Yolanda Young presents for  Chief Complaint  Patient presents with   Medical Management of Chronic Issues    1 month follow up. At last appointment pt was started on Gabapentin. Pt stated that the medication has stopped the sharp pain that was shooting into her head but she is still having the tingling, pens and needles feel every where else.    1)  56 YR OLD FEMALE WITH CHRONIC PAIN STARTED ON GABAPENTIN /[PREDNSONE 3 MONTHS AGO. THE NECK PAIN IMPROVED TRANSIENTLY WITH PREDNISONE BUT THE OTHER PAINS DID NOT IMPROVE    DIAGNOSED WITH SLE IN 1990 BY DR SINCLAIR WITH A BLOOD TEST   MOST EGREGIOUS PAIN IN LOWER   BACK THAT RADIATES TO LEFT FOOT DESCRIBED AS SHOOTING PAIN IMPROVED WITH STANDING UP , FOOT FEELS NUMB.     Outpatient Medications Prior to Visit  Medication Sig Dispense Refill   baclofen (LIORESAL) 10 MG tablet Take 1 tablet (10 mg total) by mouth 3 (three) times daily. 90 each 3   cholestyramine (QUESTRAN) 4 g packet Take 1 packet (4 g total) by mouth 3 (three) times daily. 60 each 1   gabapentin (NEURONTIN) 100 MG capsule Take 1 capsule (100 mg total) by mouth 3 (three) times daily. 90 capsule 3   ibuprofen (ADVIL,MOTRIN) 200 MG tablet Take 600 mg by mouth every 6 (six) hours as needed for moderate pain.     ondansetron (ZOFRAN ODT) 4 MG disintegrating tablet Take 1 tablet (4 mg total) by mouth every 8 (eight) hours as needed for nausea or vomiting. 20 tablet 5   pantoprazole (PROTONIX) 40 MG tablet Take 1 tablet (40 mg total) by mouth daily. 90 tablet 3   propranolol ER (INDERAL LA) 80 MG 24 hr capsule Take 1 capsule (80 mg total) by mouth daily. 30 capsule 5   traMADol (ULTRAM) 50 MG tablet Take 1 tablet (50 mg total) by mouth 2 (two) times daily as needed for up to 60 doses for moderate pain. 60 tablet 5   ursodiol (ACTIGALL) 300 MG  capsule Take 1 capsule (300 mg total) by mouth 3 (three) times daily. (Patient not taking: Reported on 02/25/2023) 90 capsule 2   predniSONE (DELTASONE) 10 MG tablet 6 tablets daily for 3 days, then reduce by 1 tablet daily until gone (Patient not taking: Reported on 02/25/2023) 33 tablet 0   No facility-administered medications prior to visit.    Review of Systems;  Patient denies headache, fevers, malaise, unintentional weight loss, skin rash, eye pain, sinus congestion and sinus pain, sore throat, dysphagia,  hemoptysis , cough, dyspnea, wheezing, chest pain, palpitations, orthopnea, edema, abdominal pain, nausea, melena, diarrhea, constipation, flank pain, dysuria, hematuria, urinary  Frequency, nocturia, numbness, tingling, seizures,  Focal weakness, Loss of consciousness,  Tremor, insomnia, depression, anxiety, and suicidal ideation.      Objective:  BP (!) 128/92   Pulse 88   Temp 99 F (37.2 C) (Oral)   Ht 5\' 8"  (1.727 m)   Wt 245 lb 6.4 oz (111.3 kg)   LMP 03/29/2013 (Approximate)   SpO2 98%   BMI 37.31 kg/m   BP Readings from Last 3 Encounters:  06/01/23 (!) 128/92  03/09/23 (!) 130/96  01/27/23 122/86    Wt Readings from Last 3 Encounters:  06/01/23 245 lb 6.4 oz (111.3 kg)  03/09/23 240 lb (108.9 kg)  01/27/23 245 lb 6.4 oz (111.3 kg)    Physical Exam  Lab Results  Component Value Date   HGBA1C 6.3 01/27/2023   HGBA1C 6.1 09/23/2022   HGBA1C 5.9 03/05/2020    Lab Results  Component Value Date   CREATININE 1.07 01/27/2023   CREATININE 0.82 09/23/2022   CREATININE 0.93 09/12/2022    Lab Results  Component Value Date   WBC 7.7 09/23/2022   HGB 14.6 09/23/2022   HCT 44.0 09/23/2022   PLT 300.0 09/23/2022   GLUCOSE 105 (H) 01/27/2023   CHOL 239 (H) 09/23/2022   TRIG 103.0 09/23/2022   HDL 69.00 09/23/2022   LDLDIRECT 164.0 09/23/2022   LDLCALC 149 (H) 09/23/2022   ALT 30 01/27/2023   AST 21 01/27/2023   NA 140 01/27/2023   K 4.3 01/27/2023   CL  104 01/27/2023   CREATININE 1.07 01/27/2023   BUN 16 01/27/2023   CO2 21 01/27/2023   TSH 1.57 01/27/2023   INR 1.0 08/01/2020   HGBA1C 6.3 01/27/2023    MM 3D DIAGNOSTIC MAMMOGRAM UNILATERAL LEFT BREAST  Result Date: 05/06/2023 CLINICAL DATA:  Six-month follow-up of a left breast asymmetry EXAM: DIGITAL DIAGNOSTIC UNILATERAL LEFT MAMMOGRAM WITH TOMOSYNTHESIS TECHNIQUE: Left digital diagnostic mammography and breast tomosynthesis was performed. COMPARISON:  Previous exam(s). ACR Breast Density Category b: There are scattered areas of fibroglandular density. FINDINGS: The left breast asymmetry is stable. No other suspicious findings in the left breast. IMPRESSION: Oval probably benign left breast asymmetry. RECOMMENDATION: Six-month follow-up of the left breast asymmetry. The patient will be due for bilateral mammography at that time. I have discussed the findings and recommendations with the patient. If applicable, a reminder letter will be sent to the patient regarding the next appointment. BI-RADS CATEGORY  3: Probably benign. Electronically Signed   By: Gerome Sam III M.D.   On: 05/06/2023 10:40   Assessment & Plan:  .There are no diagnoses linked to this encounter.   I provided 30 minutes of face-to-face time during this encounter reviewing patient's last visit with me, patient's  most recent visit with cardiology,  nephrology,  and neurology,  recent surgical and non surgical procedures, previous  labs and imaging studies, counseling on currently addressed issues,  and post visit ordering to diagnostics and therapeutics .   Follow-up: No follow-ups on file.   [name], MD

## 2023-06-02 LAB — SEDIMENTATION RATE: Sed Rate: 41 mm/hr — ABNORMAL HIGH (ref 0–30)

## 2023-06-02 LAB — COMPREHENSIVE METABOLIC PANEL
ALT: 27 U/L (ref 0–35)
AST: 23 U/L (ref 0–37)
Albumin: 4.3 g/dL (ref 3.5–5.2)
Alkaline Phosphatase: 121 U/L — ABNORMAL HIGH (ref 39–117)
BUN: 9 mg/dL (ref 6–23)
CO2: 26 mEq/L (ref 19–32)
Calcium: 10.1 mg/dL (ref 8.4–10.5)
Chloride: 104 mEq/L (ref 96–112)
Creatinine, Ser: 0.88 mg/dL (ref 0.40–1.20)
GFR: 73.91 mL/min (ref >60.00)
Glucose, Bld: 96 mg/dL (ref 70–99)
Potassium: 4.6 mEq/L (ref 3.5–5.1)
Sodium: 139 mEq/L (ref 135–145)
Total Bilirubin: 0.6 mg/dL (ref 0.2–1.2)
Total Protein: 7.4 g/dL (ref 6.0–8.3)

## 2023-06-02 LAB — HEMOGLOBIN A1C: Hgb A1c MFr Bld: 6.1 % (ref 4.6–6.5)

## 2023-06-02 LAB — C-REACTIVE PROTEIN: CRP: 1.1 mg/dL (ref 0.5–20.0)

## 2023-06-02 NOTE — Assessment & Plan Note (Signed)
There is no evidence of advanced degenerative disk disease by   MRI cervical spine which was repeated in March 2024. Her pain has improved and is no longer radiating to scalp or arms.  She declines neurology follow up at this time

## 2023-06-02 NOTE — Assessment & Plan Note (Addendum)
Reviewed previous lumbar spine plain films noting degenerative changes at T11-L1.  She has no recent lumbar spine MRI and no history of surgery. MRI Lumbar spine ordered, ,but will likely require PT evaluation and treatment first. Will repeat prednisone taper and change gabapentin to lyrica as a trial .  She is not opioid seeking .

## 2023-06-02 NOTE — Assessment & Plan Note (Signed)
Noted on liver biopsy without fibrosis. Advised to lose weight and follow up with Dr Servando Snare given her multiple GI issues

## 2023-06-02 NOTE — Assessment & Plan Note (Signed)
She has had a  a weight gain of 60 lbs over the last 3 years with no previous workup by her former PCP.  Records indicate a slower increase (34 lbs since 2017).  I have addressed  BMI and recommended a low glycemic index diet utilizing smaller more frequent meals to increase metabolism. Thyroid function is normal, as is screening for lipid disordersand diabetes ; however she has prediabetes.    Lab Results  Component Value Date   HGBA1C 6.3 01/27/2023    Lab Results  Component Value Date   TSH 1.57 01/27/2023

## 2023-06-02 NOTE — Assessment & Plan Note (Signed)
Currently improving with empiric use of urosdiol as treatment for  presumed PBC by Dr Servando Snare.    Advised to follow up with him   Lab Results  Component Value Date   ALT 30 01/27/2023   AST 21 01/27/2023   ALKPHOS 128 (H) 01/27/2023   BILITOT 0.5 01/27/2023

## 2023-06-02 NOTE — Assessment & Plan Note (Addendum)
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 , and disk herniation without foraminal or spinal stenosis was noted. Symptoms have worsened since then and she has weakness bilaterally, decreased grip, wrist flexion and pain radiating to left middle finger.  Cervical spine MRI was repeated and showed no progression of disk disease.  She was referred for repeat  EMG/[place] studies but declined to see Dr Everlena Cooper

## 2023-06-03 LAB — ANTI-NUCLEAR AB-TITER (ANA TITER): ANA Titer 1: 1:320 {titer} — ABNORMAL HIGH

## 2023-06-03 LAB — ANA: Anti Nuclear Antibody (ANA): POSITIVE — AB

## 2023-06-03 NOTE — Addendum Note (Signed)
Addended by: [name] on: 06/03/2023 11:21 PM   Modules accepted: Orders

## 2023-06-18 ENCOUNTER — Ambulatory Visit
Admission: RE | Admit: 2023-06-18 | Discharge: 2023-06-18 | Disposition: A | Discharge status: Home / Self Care | Payer: Medicare Other | Source: Ambulatory Visit | Attending: Internal Medicine | Admitting: Internal Medicine

## 2023-06-18 DIAGNOSIS — M5432 Sciatica, left side: Secondary | ICD-10-CM

## 2023-06-22 ENCOUNTER — Encounter: Payer: Self-pay | Admitting: Internal Medicine

## 2023-06-22 DIAGNOSIS — G8929 Other chronic pain: Secondary | ICD-10-CM

## 2023-06-22 DIAGNOSIS — M6281 Muscle weakness (generalized): Secondary | ICD-10-CM

## 2023-07-31 ENCOUNTER — Encounter: Payer: Self-pay | Admitting: Internal Medicine

## 2023-08-03 ENCOUNTER — Encounter: Payer: Self-pay | Admitting: Internal Medicine

## 2023-08-03 DIAGNOSIS — G8929 Other chronic pain: Secondary | ICD-10-CM

## 2023-08-05 NOTE — Assessment & Plan Note (Addendum)
MRI lumbar spine noted degeerative changes with mild spinal stenosis at L4-5 , no significant foraminal stenosis.  PT referral recommended

## 2023-09-03 ENCOUNTER — Telehealth: Payer: Self-pay | Admitting: Internal Medicine

## 2023-09-03 NOTE — Telephone Encounter (Signed)
Copied from CRM [id number]. Topic: Medicare AWV >> Sep 03, 2023 10:28 AM Payton Doughty wrote: Reason for CRM: Called LM 09/03/2023 to schedule AWV   Verlee Rossetti; Care Guide Ambulatory Clinical Support [place] [facility] Direct Dial: [phone]

## 2023-09-14 NOTE — Therapy (Unsigned)
OUTPATIENT PHYSICAL THERAPY EVALUATION   Patient Name: Yolanda Young MRN: [id number] DOB:Apr 15, 1966, 57 y.o., female Today's Date: 09/15/2023  END OF SESSION:  PT End of Session - 09/15/23 1932     Visit Number 1    Number of Visits 13    Date for PT Re-Evaluation 12/08/23    Authorization Type MEDICARE PART B reporting period from 09/15/2023    Progress Note Due on Visit 10    PT Start Time 1030    PT Stop Time 1115    PT Time Calculation (min) 45 min    Behavior During Therapy [facility] for tasks assessed/performed             Past Medical History:  Diagnosis Date   Colon polyp 02/04/2018   tubular adenoma   GERD (gastroesophageal reflux disease)    History of hiatal hernia    Migraine    weekly   Neck pain    Panic attacks    Vertigo    Past Surgical History:  Procedure Laterality Date   ABLATION ON ENDOMETRIOSIS  2013   [place]   CHOLECYSTECTOMY N/A 04/05/2018   Procedure: LAPAROSCOPIC CHOLECYSTECTOMY WITH INTRAOPERATIVE CHOLANGIOGRAM;  Surgeon: Earline Mayotte, MD;  Location: [facility];  Service: General;  Laterality: N/A;   COLONOSCOPY WITH PROPOFOL N/A 02/04/2018   Procedure: COLONOSCOPY WITH PROPOFOL;  Surgeon: Midge Minium, MD;  Location: [facility];  Service: Endoscopy;  Laterality: N/A;   COLONOSCOPY WITH PROPOFOL N/A 03/09/2023   Procedure: COLONOSCOPY WITH PROPOFOL;  Surgeon: Midge Minium, MD;  Location: [facility];  Service: Endoscopy;  Laterality: N/A;   ESOPHAGOGASTRODUODENOSCOPY (EGD) WITH PROPOFOL N/A 02/04/2018   Procedure: ESOPHAGOGASTRODUODENOSCOPY (EGD) WITH PROPOFOL;  Surgeon: Midge Minium, MD;  Location: [facility];  Service: Endoscopy;  Laterality: N/A;   ESOPHAGOGASTRODUODENOSCOPY (EGD) WITH PROPOFOL N/A 07/03/2020   Procedure: ESOPHAGOGASTRODUODENOSCOPY (EGD) WITH PROPOFOL;  Surgeon: Regis Bill, MD;  Location: [facility] ENDOS[facility];  Service: Endoscopy;  Laterality: N/A;   SHOULDER SURGERY Right    Patient Active  Problem List   Diagnosis Date Noted   Cervical myelopathy with cervical radiculopathy (HCC) 01/27/2023   Muscle weakness of lower extremity 01/27/2023   Numbness and tingling in both hands 01/27/2023   Impaired fasting glucose 01/27/2023   Elevated alkaline phosphatase measurement 09/24/2022   Fibromyalgia 09/23/2022   Chest pain 09/12/2022   Barrett's esophagus without dysplasia 07/06/2020   IBS (irritable bowel syndrome) 07/06/2020   Chronic 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 04/03/2020   Hepatic steatosis 04/03/2020   Neuropathy 03/22/2020   Excessive body weight gain 03/06/2020   Amenorrhea, secondary 03/06/2020   Exertional dyspnea 03/06/2020   Neck pain of over 3 months duration 03/06/2020   History of colonic polyps    Colon cancer screening    Polyp of sigmoid colon    Benign neoplasm of descending colon    Gastritis without bleeding     PCP: [name], MD  REFERRING PROVIDER: [name], MD  REFERRING DIAG: chronic 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  Rationale for Evaluation and Treatment: Rehabilitation  THERAPY DIAG:  Other low back pain  Neuralgia and neuritis  Muscle weakness (generalized)  Other abnormalities of gait and mobility  ONSET DATE:  chronic over years, but worsening over the last year and 6 months  SUBJECTIVE:  SUBJECTIVE STATEMENT: Patient states she has a lot going on, but mainly she is here because her lower back is bothering her. She states she has had lower back issues for years but it has started getting worse and going down her left leg in the last year, with the last 6 months being the worst. At least a week ago she started having burning pain in her left foot. It feels like the bottom of her foot feels like it is on  fire. She also has numbness and gets cramps in her toes too. She states both her feet goes numb and feel "asleep" but the pain on the left is worse and feels like she has broken bones in her left foot. She does get pain on the right as well, but not as bad. She states she has fibromyalgia, and they think she has inflammatory arthritis. She just started going to the rheumatologist at [facility]. She just got a new medication this weekend, that her rheumatologist said would take a few months to produce any noticeable change if it helps.  She states the worst pain is on her left hip. She states she trips and falls a lot, but she does not recall every really hurting her back. She states her leg just suddenly gives way and that leads to her falls. She thought it was her right leg last time. She states she trips a lot and notices her left foot feels like it is slapping and she is not picking it up when she is walking. She states she trips a lot prior to the last 6 months, but she did not notice the slapping or weakness until the last 6 months. She states her low back gets really stiff and she has spasms that are so bad they knock her to the ground. She feels like her condition is getting worse. She also has numbness in her arms and hands. She did have an MRI in her neck that showed some bulging discs in her neck. She states she does have neuropathy.  She is able to get back up when she falls.   She states she had a brain MRI that was suspicious for MS, but her neurologist did not think it was MS. She is seeking a second opinion with another neurologist. She started seeing a a neurologist 15 years ago, and they saw a couple of lesions that made them suspicious but it was never confirmed. They could never be sure.  She states she also has abnormal labs for autoimmune hepatitis with indeterminate biopsy in 2021.   From recent rhumatology note:  "increase suspicion for unidentified/emerging autoimmune process with  seronegative rheumatoid arthritis vs undifferentiated connective tissue disease both being on the differential"  Things she has tried for pain: She hates taking medicines (doesn't like how she feels, concern for her liver, not wanting to put stuff in her body). She takes baclofen for spasms which helps some, does take over the counter pain meds but is not supposed to due to her liver.   PERTINENT HISTORY:  Patient is a 57 y.o. female who presents to outpatient physical therapy with a referral for medical diagnosis chronic 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. This patient's chief complaints consist of chronic low back pain with radiation down left > right LE to foot including into groin region, leading to the following functional deficits: ifficulty with day to day activities such as sleeping, laying on left side or right side, mornings, end of day,  sitting, standing, stairs, getting up from a chair, getting dressed, bending forwards, twisting, lifting, being outside, working in the garden, hiking, household and community mobility, social participation, bathing. Relevant past medical history and comorbidities include brain lesions suggestive of MS but inconclusive, Undifferentiated inflammatory arthritis; Positive ANA (antinuclear antibody); Antimitochondrial antibody positive; She is a former smoker who smokes 0.2 ppd for 1 years, for a total of 0.2 pack years;  has History of colonic polyps; Colon cancer screening; Polyp of sigmoid colon; Benign neoplasm of descending colon; Gastritis without bleeding; Excessive body weight gain; Amenorrhea, secondary; Exertional dyspnea; Neck pain of over 3 months duration; Neuropathy; Chronic 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; Hepatic steatosis; Barrett's esophagus without dysplasia; IBS (irritable bowel syndrome); Chest pain; Fibromyalgia; Elevated alkaline phosphatase measurement; Cervical myelopathy with cervical radiculopathy (HCC); Muscle  weakness of lower extremity; Numbness and tingling in both hands; and Impaired fasting glucose on their problem list.  has a past medical history of Colon polyp (02/04/2018), GERD (gastroesophageal reflux disease), History of hiatal hernia, Migraine, Neck pain, Panic attacks, and Vertigo.  has a past surgical history that includes Ablation on endometriosis (2013); Shoulder surgery (Right); Colonoscopy with propofol (N/A, 02/04/2018); Esophagogastroduodenoscopy (egd) with propofol (N/A, 02/04/2018); Cholecystectomy (N/A, 04/05/2018); Esophagogastroduodenoscopy (egd) with propofol (N/A, 07/03/2020); and Colonoscopy with propofol (N/A, 03/09/2023). Patient denies hx of stroke, seizures, lung problems, heart problems, diabetes, unexplained weight loss, osteoporosis, and spinal surgery.   Red flags:  Urinary incontinence (large amount) without urge (has been going on for the last 1.5 years). Has had trouble with mixed urinary before but not without the urge. This has been shared with Dr. Darrick Huntsman  by patient and a Lumbar MRI from 07/2023 showed normal cauda equina.  Stumbling/dropping things: patient has diagnosis of neuropathy, Cervical MRI from 02/02/2023 negative for significant spinal stenosis.   PAIN: Are you having pain? Yes NPRS: Current: 5-6/10,  Best: 5/10, Worst: 8/10. Pain location: across low back, left lateral hip over glute med region (the worst), shoots into her left groin, pain shoots down back of leg usually stops at knee, but will get horrible cramping in her lower leg and numbness in the foot, also has pain at plantar surface of the foot. Also has these symptoms on the right but much less frequent or intense.  Pain description: numbness, tingling, shooting, burning, aches.  Aggravating factors: laying on left side or right side, mornings, end of day, sitting, standing, stairs, getting up from a chair, getting dressed, bending forwards, twisting, lifting.  Relieving factors: compression stockings,  baclofen, laying flat on back, getting up and moving some.   FUNCTIONAL LIMITATIONS: difficulty with day to day activities such as sleeping, laying on left side or right side, mornings, end of day, sitting, standing, stairs, getting up from a chair, getting dressed, bending forwards, twisting, lifting, being outside, working in the garden, hiking, household and community mobility, social participation, bathing.   LEISURE: used to be really active, being outside, working in garden, lived at [employer], used to go swimming and fishing on ex-husband's boat. Meaningful activities include being outside, working in the garden and yard, going hiking in [place].   PRECAUTIONS: Fall  WEIGHT BEARING RESTRICTIONS: No  FALLS:  Has patient fallen in last 6 months? Yes. Number of falls 2 Stumbles a lot She has a cane that she uses to help her get up and down from the couch or when she is having spasms.   LIVING ENVIRONMENT: Lives with: lives with their family and  lives alone Lives in: one story home with a level entry Has following equipment at home: Single point cane, shower seat.   OCCUPATION: On disability, used to work in Audiological scientist.   PLOF: Independent  PATIENT GOALS: "to get some relief"  NEXT MD VISIT: 12/03/2023  OBJECTIVE  DIAGNOSTIC FINDINGS:  Lumbar MRI report from 07/27/2023:  EXAMINATION: MRI lumbar spine without contrast   CLINICAL INDICATION: Lower extremity weakness   TECHNIQUE: MRI lumbar spine protocol without contrast.   COMPARISON: None   FINDINGS:   Bone marrow signal: Normal   Conus medullaris and cauda equina: Normal   L1-L2: Normal   L2-L3: Normal   L3-L4: Mild disc bulge. Otherwise normal   L4-L5: There is a broad-based disc bulge. There is facet arthropathy with facet enlargement and mild spinal stenosis. No significant foraminal stenosis   L5-S1: There is a mild disc bulge. There is mild facet arthropathy. No spinal stenosis or foraminal stenosis     IMPRESSION:   Mild degenerative changes. There is mild spinal stenosis at L4-5.   No severe spinal stenosis.   Brain MRI report from 08/30/2021:  IMPRESSION: Scattered foci of hyperintense T2-weighted signal in the bilateral periatrial white matter and superior left frontal lobe. The distribution is compatible with multiple sclerosis. No active demyelination.  Cervical spine MRI report from 02/02/2023:   MRI cervical spine without contrast:  INDICATION: Neck pain.  TECHNIQUE: Sagittal T1, T2, and STIR images were performed. Axial T2-weighted images.  COMPARISON: None available at time of dictation.  FINDINGS: No prevertebral soft tissue swelling/edema. No suspicious marrow signal. The cervical spinal cord is normal in signal intensity. The vertebral body and intervertebral disc heights are maintained. No listhesis.  C2-3: No significant spinal canal or neural foraminal stenosis. C3-4: No significant spinal canal or neural foraminal stenosis. C4-5: No significant spinal canal or neural foraminal stenosis. C5-6: No significant spinal canal or neural foraminal stenosis. C6-7: No significant spinal canal or neural foraminal stenosis. Central posterior disc osteophyte complex. C7-T1: No significant spinal canal or neural foraminal stenosis.   IMPRESSION: No significant spinal canal or neural foraminal stenosis.   SELF- REPORTED FUNCTION FOTO score: 31/100 (lumbar spine questionnaire)  OBSERVATION/INSPECTION Posture Posture (seated): forward head, rounded shoulders, slumped in sitting.  Posture (standing): slight left lateral shift Anthropometrics Tremor: none Body composition: BMI 36.5 Functional Mobility Transfers: sit <> stand mod I for increased time, B UE support, effortful and painful.  Gait: ambulates short community distances with no AD. More detailed gait analysis to be performed at future visits as appropriate.   SPINE MOTION LUMBAR SPINE AROM *Indicates  pain Flexion: fingers to toes, increased pain in the low back and down left posterior leg. Aberrant motion. (Patient cannot remember what is normal for her).  Extension: 25% increased midline low back pain Side Flexion:   R fingers 3 inches from superior patella, mid low back pain  L fingers 3 inches from superior patella, mid low back pain and radiation to left posterior thigh.  Rotation:  R WNL, discomfort in upper back L 25% increased pain over left hip/groin.   NEUROLOGICAL Upper Motor Neuron Screen Babinski, and Clonus (ankle) negative bilaterally.  Dermatomes L2-S2 appears equal and intact to light touch.  PERIPHERAL JOINT MOTION (in degrees) ACTIVE RANGE OF MOTION (AROM) 09/15/2023: B LE grossly WFL for basic mobility except left hip flexion unable to lift past 90 degrees in sitting.   MUSCLE PERFORMANCE (MMT):  * means painful STRENGTH:  Hip  Flexion:  R =  4/5, inconsistent resistance L = 2/5* unable to hold off table Extension: deferred due to irritability Abduction: deferred due to irritability Adduction: deferred due to irritability Knee Ext:  R = 4/5,  L = 3+/5, inconsistent resistance Flex:  R = 4/5, mildly inconsistent resistance L = 3+/5, inconsistent resistance Ankle (seated position) Dorsiflexion:  R = 4/5, mild difficulty maintain resistance L = unable to maintain constant dorsiflexion resistance (and alternated uncontrollably to active plantar flexion).  Plantarflexion:  R = 5/5, mild tremor like movment L = 5/5 (ankle moving like restless) Eversion:  R = 5/5 mild tremor like movement L = 4/5 unable to maintain consistent resistance Great Toe Extension:  R = 5/5 L = 4/5, inconsistent resistance   SPECIAL TESTS: Deferred due to irritability  ACCESSORY MOTION: Deferred due to irritability  PALPATION: Deferred due to irritability   TODAY'S TREATMENT:    education  PATIENT EDUCATION:  Education details: Education on diagnosis, prognosis,  POC, anatomy and physiology of current condition.  Person educated: Patient Education method: Explanation Education comprehension: verbalized understanding and needs further education  HOME EXERCISE PROGRAM: TBA  ASSESSMENT:  CLINICAL IMPRESSION: Patient is a 57 y.o. female referred to outpatient physical therapy with a medical diagnosis of chronic 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 who presents with signs and symptoms consistent with chronic low back pain with B radiculopathy (L>R) and neuropathic weakness bilaterally (L > R) and possible left hip pain with multifactorial contributions. Patient has complex history which includes fibromyalgia, peripheral neuropathy, likely inflammatory arthritis and/or autoimmune etiology, and unexplained brain lesions that resemble those found in MS complicating her presentation. She has high pain irritability and objective exam was limited to avoid severe exacerbation of symptoms. She had neuropathic weakness in L > R LE but not loss of sensation to touch. Neurodynamic testing and intra-articular hip testing reserved for later date due to irritability. Patient does report red flags of urinary incontinence without urge and unexplained stumbling and dropping things. Her recent Lumbar MRI showed normal cauda equina, and cervical spine MRI showed no significant spinal stenosis (despite cervical myelopathy being in her list of comorbid conditions). Patient would likely do best with interdisciplinary approach to chronic pain with PT focusing on graded exercise, pain coping planning, education, and multimodal techniques focused on maximization of function for meaningful activities. Patient presents with significant pain, motor control, posture, ROM, joint stiffness, paresthesia, knowledge, muscle performance (strength/power/endurance), balance, allodynia, hyperesthesia, and activity tolerance impairments that are limiting ability to complete usual day to day  activities such as sleeping, laying on left side or right side, mornings, end of day, sitting, standing, stairs, getting up from a chair, getting dressed, bending forwards, twisting, lifting, being outside, working in the garden, hiking, household and community mobility, social participation, bathing without difficulty. Patient will benefit from skilled physical therapy intervention to address current body structure impairments and activity limitations to improve function and work towards goals set in current POC in order to return to prior level of function or maximal functional improvement.   OBJECTIVE IMPAIRMENTS: Abnormal gait, decreased activity tolerance, decreased balance, decreased coordination, decreased endurance, decreased knowledge of condition, decreased mobility, difficulty walking, decreased ROM, decreased strength, impaired perceived functional ability, increased muscle spasms, impaired flexibility, impaired tone, improper body mechanics, obesity, and pain.   ACTIVITY LIMITATIONS: carrying, lifting, bending, sitting, standing, squatting, sleeping, stairs, transfers, bed mobility, continence, bathing, dressing, hygiene/grooming, and locomotion level  PARTICIPATION LIMITATIONS: meal prep, cleaning, laundry, interpersonal relationship, shopping, community activity, yard work, and  difficulty with day to day activities such as sleeping, laying on left side or right side, mornings, end of day, sitting, standing, stairs, getting up from a chair, getting dressed, bending forwards, twisting, lifting, being outside, working in the garden, hiking, household and community mobility, social participation, bathing  PERSONAL FACTORS: Fitness, Past/current experiences, Time since onset of injury/illness/exacerbation, and 3+ comorbidities:   brain lesions suggestive of MS but inconclusive, Undifferentiated inflammatory arthritis; Positive ANA (antinuclear antibody); Antimitochondrial antibody positive; She  is a former smoker who smokes 0.2 ppd for 1 years, for a total of 0.2 pack years;  has History of colonic polyps; Colon cancer screening; Polyp of sigmoid colon; Benign neoplasm of descending colon; Gastritis without bleeding; Excessive body weight gain; Amenorrhea, secondary; Exertional dyspnea; Neck pain of over 3 months duration; Neuropathy; Chronic 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; Hepatic steatosis; Barrett's esophagus without dysplasia; IBS (irritable bowel syndrome); Chest pain; Fibromyalgia; Elevated alkaline phosphatase measurement; Cervical myelopathy with cervical radiculopathy (HCC); Muscle weakness of lower extremity; Numbness and tingling in both hands; and Impaired fasting glucose on their problem list.  has a past medical history of Colon polyp (02/04/2018), GERD (gastroesophageal reflux disease), History of hiatal hernia, Migraine, Neck pain, Panic attacks, and Vertigo.  has a past surgical history that includes Ablation on endometriosis (2013); Shoulder surgery (Right); Colonoscopy with propofol (N/A, 02/04/2018); Esophagogastroduodenoscopy (egd) with propofol (N/A, 02/04/2018); Cholecystectomy (N/A, 04/05/2018); Esophagogastroduodenoscopy (egd) with propofol (N/A, 07/03/2020); and Colonoscopy with propofol (N/A, 03/09/2023) are also affecting patient's functional outcome.   REHAB POTENTIAL: Fair due to chronic and complex nature of condition and comorbidity  CLINICAL DECISION MAKING: Evolving/moderate complexity  EVALUATION COMPLEXITY: Moderate   GOALS: Goals reviewed with patient? No  SHORT TERM GOALS: Target date: 09/29/2023  Patient will be independent with initial home exercise program for self-management of symptoms. Baseline: Initial HEP to be provided at visit 2 as appropriate (09/15/23); Goal status: INITIAL   LONG TERM GOALS: Target date: 12/08/2023  Patient will be independent with a long-term home exercise program for self-management of symptoms.   Baseline: Initial HEP to be provided at visit 2 as appropriate (09/15/23); Goal status: INITIAL  2.  Patient will demonstrate improved FOTO to equal or greater than 42 by visit #12 to demonstrate improvement in overall condition and self-reported functional ability.  Baseline: 31 (09/15/23); Goal status: INITIAL  3.  Patient will improve 3 points on the Patient Specific Functional Scale to demonstrate improvement in patient's ability to participate in personally meaningful activities.  Baseline: To be tested at visit two as appropriate (09/15/23); Goal status: INITIAL  4.  Patient will report 50% improvement in her confidence in her ability to implement a flair-up plan to control pain to tolerable levels while remaining active to improve quality of life and functional participation.  Baseline: patient does not have a flair up plan (09/15/23); Goal status: INITIAL  5.  Patient will demonstrate B LE strength to equal or greater than 4/5 with ability to consistently resist force to improve ability to navigate stairs, household and community mobility, and engage in outdoor activities with less risk of falling and improved tolerance.  Baseline: as low as 3+/5 at L LE with inability to maintain resistance in most L LE motions (09/15/2023);  Goal status: INITIAL   PLAN:  PT FREQUENCY: 1-2x/week  PT DURATION: 12 weeks  PLANNED INTERVENTIONS: Therapeutic exercises, Therapeutic activity, Neuromuscular re-education, Balance training, Gait training, Patient/Family education, Self Care, Joint mobilization, Stair training, DME instructions, Aquatic Therapy, Dry Needling,  Electrical stimulation, Spinal mobilization, Cryotherapy, Moist heat, Manual therapy, and Re-evaluation.  PLAN FOR NEXT SESSION: Update HEP as appropriate, trial of TENS with exercise to improve tolerance and pain control, graded exercises to address LE/core/functional strength/motor control/balance/neurodyamics/flexibility,  education, manual/dry needling as appropriate. Gait training as appropriate.    Cira Rue, PT, DPT 09/15/2023, 8:08 PM  [facility] [address] [place], [place] [address] P: [phone] I F: [phone]

## 2023-09-15 ENCOUNTER — Ambulatory Visit: Payer: Medicare Other | Attending: Internal Medicine | Admitting: Physical Therapy

## 2023-09-15 ENCOUNTER — Encounter: Payer: Self-pay | Admitting: Physical Therapy

## 2023-09-15 DIAGNOSIS — R2689 Other abnormalities of gait and mobility: Secondary | ICD-10-CM | POA: Insufficient documentation

## 2023-09-15 DIAGNOSIS — M5459 Other low back pain: Secondary | ICD-10-CM | POA: Insufficient documentation

## 2023-09-15 DIAGNOSIS — M792 Neuralgia and neuritis, unspecified: Secondary | ICD-10-CM | POA: Insufficient documentation

## 2023-09-15 DIAGNOSIS — G8929 Other chronic pain: Secondary | ICD-10-CM | POA: Diagnosis not present

## 2023-09-15 DIAGNOSIS — M6281 Muscle weakness (generalized): Secondary | ICD-10-CM | POA: Diagnosis present

## 2023-09-15 DIAGNOSIS — M5442 Lumbago with sciatica, left side: Secondary | ICD-10-CM | POA: Insufficient documentation

## 2023-09-21 ENCOUNTER — Encounter: Payer: Medicare Other | Admitting: Physical Therapy

## 2023-09-21 NOTE — Therapy (Unsigned)
OUTPATIENT PHYSICAL THERAPY TREATMENT   Patient Name: Yolanda Young MRN: [id number] DOB:11-Feb-1966, 57 y.o., female Today's Date: 09/23/2023  END OF SESSION:  PT End of Session - 09/23/23 0949     Visit Number 2    Number of Visits 13    Date for PT Re-Evaluation 12/08/23    Authorization Type MEDICARE PART B reporting period from 09/15/2023    Progress Note Due on Visit 10    PT Start Time 0947    PT Stop Time 1025    PT Time Calculation (min) 38 min    Behavior During Therapy [facility] for tasks assessed/performed              Past Medical History:  Diagnosis Date   Colon polyp 02/04/2018   tubular adenoma   GERD (gastroesophageal reflux disease)    History of hiatal hernia    Migraine    weekly   Neck pain    Panic attacks    Vertigo    Past Surgical History:  Procedure Laterality Date   ABLATION ON ENDOMETRIOSIS  2013   [place]   CHOLECYSTECTOMY N/A 04/05/2018   Procedure: LAPAROSCOPIC CHOLECYSTECTOMY WITH INTRAOPERATIVE CHOLANGIOGRAM;  Surgeon: Earline Mayotte, MD;  Location: [facility];  Service: General;  Laterality: N/A;   COLONOSCOPY WITH PROPOFOL N/A 02/04/2018   Procedure: COLONOSCOPY WITH PROPOFOL;  Surgeon: Midge Minium, MD;  Location: [facility];  Service: Endoscopy;  Laterality: N/A;   COLONOSCOPY WITH PROPOFOL N/A 03/09/2023   Procedure: COLONOSCOPY WITH PROPOFOL;  Surgeon: Midge Minium, MD;  Location: [facility];  Service: Endoscopy;  Laterality: N/A;   ESOPHAGOGASTRODUODENOSCOPY (EGD) WITH PROPOFOL N/A 02/04/2018   Procedure: ESOPHAGOGASTRODUODENOSCOPY (EGD) WITH PROPOFOL;  Surgeon: Midge Minium, MD;  Location: [facility];  Service: Endoscopy;  Laterality: N/A;   ESOPHAGOGASTRODUODENOSCOPY (EGD) WITH PROPOFOL N/A 07/03/2020   Procedure: ESOPHAGOGASTRODUODENOSCOPY (EGD) WITH PROPOFOL;  Surgeon: Regis Bill, MD;  Location: [facility];  Service: Endoscopy;  Laterality: N/A;   SHOULDER SURGERY Right    Patient Active  Problem List   Diagnosis Date Noted   Cervical myelopathy with cervical radiculopathy (HCC) 01/27/2023   Muscle weakness of lower extremity 01/27/2023   Numbness and tingling in both hands 01/27/2023   Impaired fasting glucose 01/27/2023   Elevated alkaline phosphatase measurement 09/24/2022   Fibromyalgia 09/23/2022   Chest pain 09/12/2022   Barrett's esophagus without dysplasia 07/06/2020   IBS (irritable bowel syndrome) 07/06/2020   Chronic 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 04/03/2020   Hepatic steatosis 04/03/2020   Neuropathy 03/22/2020   Excessive body weight gain 03/06/2020   Amenorrhea, secondary 03/06/2020   Exertional dyspnea 03/06/2020   Neck pain of over 3 months duration 03/06/2020   History of colonic polyps    Colon cancer screening    Polyp of sigmoid colon    Benign neoplasm of descending colon    Gastritis without bleeding     PCP: Yolanda Shams, MD  REFERRING PROVIDER: [name], MD  REFERRING DIAG: chronic 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  Rationale for Evaluation and Treatment: Rehabilitation  THERAPY DIAG:  Other low back pain  Neuralgia and neuritis  Muscle weakness (generalized)  Other abnormalities of gait and mobility  ONSET DATE:  chronic over years, but worsening over the last year and 6 months  PERTINENT HISTORY:  Patient is a 58 y.o. female who presents to outpatient physical therapy with a referral for medical diagnosis chronic bilateral low back pain  with left-sided sciatica. This patient's chief complaints consist of chronic low back pain with radiation down left > right LE to foot including into groin region, leading to the following functional deficits: ifficulty with day to day activities such as sleeping, laying on left side or right side, mornings, end of day, sitting, standing, stairs, getting up from a chair, getting dressed, bending forwards, twisting, lifting, being outside, working in the  garden, hiking, household and community mobility, social participation, bathing. Relevant past medical history and comorbidities include brain lesions suggestive of MS but inconclusive, Undifferentiated inflammatory arthritis; Positive ANA (antinuclear antibody); Antimitochondrial antibody positive; She is a former smoker who smokes 0.2 ppd for 1 years, for a total of 0.2 pack years;  has History of colonic polyps; Colon cancer screening; Polyp of sigmoid colon; Benign neoplasm of descending colon; Gastritis without bleeding; Excessive body weight gain; Amenorrhea, secondary; Exertional dyspnea; Neck pain of over 3 months duration; Neuropathy; Chronic 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; Hepatic steatosis; Barrett's esophagus without dysplasia; IBS (irritable bowel syndrome); Chest pain; Fibromyalgia; Elevated alkaline phosphatase measurement; Cervical myelopathy with cervical radiculopathy (HCC); Muscle weakness of lower extremity; Numbness and tingling in both hands; and Impaired fasting glucose on their problem list.  has a past medical history of Colon polyp (02/04/2018), GERD (gastroesophageal reflux disease), History of hiatal hernia, Migraine, Neck pain, Panic attacks, and Vertigo.  has a past surgical history that includes Ablation on endometriosis (2013); Shoulder surgery (Right); Colonoscopy with propofol (N/A, 02/04/2018); Esophagogastroduodenoscopy (egd) with propofol (N/A, 02/04/2018); Cholecystectomy (N/A, 04/05/2018); Esophagogastroduodenoscopy (egd) with propofol (N/A, 07/03/2020); and Colonoscopy with propofol (N/A, 03/09/2023). Patient denies hx of stroke, seizures, lung problems, heart problems, diabetes, unexplained weight loss, osteoporosis, and spinal surgery.   Red flags:  Urinary incontinence (large amount) without urge (has been going on for the last 1.5 years). Has had trouble with mixed urinary before but not without the urge. This has been shared with Dr. Darrick Huntsman  by patient and a  Lumbar MRI from 07/2023 showed normal cauda equina.  Stumbling/dropping things: patient has diagnosis of neuropathy, Cervical MRI from 02/02/2023 negative for significant spinal stenosis.   SUBJECTIVE:                                                                                                                                                                                           SUBJECTIVE STATEMENT: Patient states she is doing okay but not great this morning. She states she got a message from her rheumatologist this morning that it looks like she does have rheumatoid arthritis. No new medication recommendation at this time. She states mornings are really hard  for her because of increased pain. She states she was sore after last PT session, but that is the norm for her.   PAIN: NPRS: 5/10 lower back and hands, stiffness in her legs.   PRECAUTIONS: Fall  PATIENT GOALS: "to get some relief"  NEXT MD VISIT: 12/03/2023  OBJECTIVE  Vitals:   09/23/23 1002  BP: 120/84  Pulse: 82  SpO2: 97%   SELF-REPORTED FUNCTION Patient Specific Functional Scale (PSFS)  Daily walking/exercise: 3 Gardening: 3 Shopping/travel: 5 Average: 3.7  FUNCTIONAL MOBILITY Gait: ambulates with antalgic gait favoring left LE, with decreased control in left terminal stance to toe off. Occasional stumble.  6 Minute Walk Test: 1015 feet with no AD (see above for gait). Pain down left left leg to foot by end of test and ankle feels like it is broken.   STRENGTH (MMT):  Hip     Abduction: deferred due to irritability R = 5/5 L = 4/5*   PERIPHERAL JOINT MOTION (in degrees) PASSIVE RANGE OF MOTION (PROM) Comments:  09/23/2023:  R hip flexion and IR reproduced groin pain at end range, ER and extension okay. Other motions not tested.  L hip flexion restricted to near 90 degrees and painful! at groin, hip IR painful at groin, ER WFL,  hip extension with mild limitation and painful! at left low back  SPECIAL  TESTS: LOWER LIMB NEURODYNAMIC TESTS Straight Leg Raise (Sciatic nerve) R  = positive for posterior thigh discomfort, sensitive to foot position, moderate L  = positive for concordant low back and leg pain, sensitive to foot, position, severe.   HIP SPECIAL TESTS FADDIR: R = positive for groin pain, L = positive for groin pain. Long axis distraction:  R = discomfort in low back with mildly pain relieving pop after several seconds, feels good in hip. L  L = discomfort across lower back, feels "good to stretch"    TODAY'S TREATMENT:    Therapeutic exercise: to centralize symptoms and improve ROM, strength, muscular endurance, and activity tolerance required for successful completion of functional activities.  - vitals screen (see above).  - 6 Minute Walk Test/gait analysis (see above).  - further testing to better understand impairments contributing to functional limitations (see above).  - hooklying low trunk rotation (added pillow between knees to decrease pinching felt at left groin), 2x1 min.  - hooklying pelvic tilt AROM, 1x10 each direction - hooklying hip abduction against RTB loop 1x5 with 5 second holds.   Manual therapy: to reduce pain and tissue tension, improve range of motion, neuromodulation, in order to promote improved ability to complete functional activities. SUPINE  - Long axis distraction in open pack position, 1 min each hip.   Pt required multimodal cuing for proper technique and to facilitate improved neuromuscular control, strength, range of motion, and functional ability resulting in improved performance and form.  PATIENT EDUCATION:  Education details: Education on diagnosis, prognosis, POC, anatomy and physiology of current condition.  Person educated: Patient Education method: Explanation Education comprehension: verbalized understanding and needs further education  HOME EXERCISE PROGRAM: Access Code: [email or web address] URL:  [email or web address] Date: 09/23/2023 Prepared by: Norton Blizzard  Exercises - Supine Lower Trunk Rotation  - 2 x daily - 1-2 sets - 10 reps - 1-5 seconds hold - Supine Pelvic Tilt  - 2 x daily - 1-2 sets - 10 reps - Hooklying Clamshell with Resistance  - 2 x daily - 1-2 sets - 10 reps - 5 seconds hold  ASSESSMENT:  CLINICAL IMPRESSION: Patient  arrives reporting new RA diagnosis and usual pain. Further testing was completed to further elucidate impairments and functional limitations. Patient with groin pain with end range B hip flexion and IR, and mildly relieved with long axis distraction that suggests intra-articular hip contribution to pain, specifically possible FAI. Patient with positive SLR bilaterally with left more severe with neural irritation of the sciatic nerve. Patient had more hip ROM limitations and pain in L hip than R which correlates to patient's altered gait at terminal stance to toe off of the L LE. Patient continues to present with high impact pain that is severely restricting her function, mobility, and quality of life. She started some gentle exercises to introduce movement and motor control with good tolerance. Plan to progress HEP as tolerated next session and continue educating patient on pain control and activity/function maximizing strategies.   From initial PT evaluation on 09/15/2023:  Patient is a 57 y.o. female referred to outpatient physical therapy with a medical diagnosis of chronic 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 who presents with signs and symptoms consistent with chronic low back pain with B radiculopathy (L>R) and neuropathic weakness bilaterally (L > R) and possible left hip pain with multifactorial contributions. Patient has complex history which includes fibromyalgia, peripheral neuropathy, likely inflammatory arthritis and/or autoimmune etiology, and unexplained brain lesions that resemble those found in MS complicating her  presentation. She has high pain irritability and objective exam was limited to avoid severe exacerbation of symptoms. She had neuropathic weakness in L > R LE but not loss of sensation to touch. Neurodynamic testing and intra-articular hip testing reserved for later date due to irritability. Patient does report red flags of urinary incontinence without urge and unexplained stumbling and dropping things. Her recent Lumbar MRI showed normal cauda equina, and cervical spine MRI showed no significant spinal stenosis (despite cervical myelopathy being in her list of comorbid conditions). Patient would likely do best with interdisciplinary approach to chronic pain with PT focusing on graded exercise, pain coping planning, education, and multimodal techniques focused on maximization of function for meaningful activities. Patient presents with significant pain, motor control, posture, ROM, joint stiffness, paresthesia, knowledge, muscle performance (strength/power/endurance), balance, allodynia, hyperesthesia, and activity tolerance impairments that are limiting ability to complete usual day to day activities such as sleeping, laying on left side or right side, mornings, end of day, sitting, standing, stairs, getting up from a chair, getting dressed, bending forwards, twisting, lifting, being outside, working in the garden, hiking, household and community mobility, social participation, bathing without difficulty. Patient will benefit from skilled physical therapy intervention to address current body structure impairments and activity limitations to improve function and work towards goals set in current POC in order to return to prior level of function or maximal functional improvement.   OBJECTIVE IMPAIRMENTS: Abnormal gait, decreased activity tolerance, decreased balance, decreased coordination, decreased endurance, decreased knowledge of condition, decreased mobility, difficulty walking, decreased ROM, decreased  strength, impaired perceived functional ability, increased muscle spasms, impaired flexibility, impaired tone, improper body mechanics, obesity, and pain.   ACTIVITY LIMITATIONS: carrying, lifting, bending, sitting, standing, squatting, sleeping, stairs, transfers, bed mobility, continence, bathing, dressing, hygiene/grooming, and locomotion level  PARTICIPATION LIMITATIONS: meal prep, cleaning, laundry, interpersonal relationship, shopping, community activity, yard work, and   difficulty with day to day activities such as sleeping, laying on left side or right side, mornings, end of day, sitting, standing, stairs, getting up from a chair, getting dressed, bending forwards, twisting, lifting, being outside, working in the  garden, hiking, household and community mobility, social participation, bathing  PERSONAL FACTORS: Fitness, Past/current experiences, Time since onset of injury/illness/exacerbation, and 3+ comorbidities:   brain lesions suggestive of MS but inconclusive, Undifferentiated inflammatory arthritis; Positive ANA (antinuclear antibody); Antimitochondrial antibody positive; She is a former smoker who smokes 0.2 ppd for 1 years, for a total of 0.2 pack years;  has History of colonic polyps; Colon cancer screening; Polyp of sigmoid colon; Benign neoplasm of descending colon; Gastritis without bleeding; Excessive body weight gain; Amenorrhea, secondary; Exertional dyspnea; Neck pain of over 3 months duration; Neuropathy; Chronic 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; Hepatic steatosis; Barrett's esophagus without dysplasia; IBS (irritable bowel syndrome); Chest pain; Fibromyalgia; Elevated alkaline phosphatase measurement; Cervical myelopathy with cervical radiculopathy (HCC); Muscle weakness of lower extremity; Numbness and tingling in both hands; and Impaired fasting glucose on their problem list.  has a past medical history of Colon polyp (02/04/2018), GERD (gastroesophageal reflux  disease), History of hiatal hernia, Migraine, Neck pain, Panic attacks, and Vertigo.  has a past surgical history that includes Ablation on endometriosis (2013); Shoulder surgery (Right); Colonoscopy with propofol (N/A, 02/04/2018); Esophagogastroduodenoscopy (egd) with propofol (N/A, 02/04/2018); Cholecystectomy (N/A, 04/05/2018); Esophagogastroduodenoscopy (egd) with propofol (N/A, 07/03/2020); and Colonoscopy with propofol (N/A, 03/09/2023) are also affecting patient's functional outcome.   REHAB POTENTIAL: Fair due to chronic and complex nature of condition and comorbidity  CLINICAL DECISION MAKING: Evolving/moderate complexity  EVALUATION COMPLEXITY: Moderate   GOALS: Goals reviewed with patient? No  SHORT TERM GOALS: Target date: 09/29/2023  Patient will be independent with initial home exercise program for self-management of symptoms. Baseline: Initial HEP to be provided at visit 2 as appropriate (09/15/23); initial HEP provided (09/23/2023); Goal status: In-progress   LONG TERM GOALS: Target date: 12/08/2023  Patient will be independent with a long-term home exercise program for self-management of symptoms.  Baseline: Initial HEP to be provided at visit 2 as appropriate (09/15/23); initial HEP provided (09/23/2023); Goal status: In-progress  2.  Patient will demonstrate improved FOTO to equal or greater than 42 by visit #12 to demonstrate improvement in overall condition and self-reported functional ability.  Baseline: 31 (09/15/23); Goal status: In-progress  3.  Patient will improve 3 points on the Patient Specific Functional Scale to demonstrate improvement in patient's ability to participate in personally meaningful activities.  Baseline: To be tested at visit two as appropriate (09/15/23); 3.7 at visit #2 (09/23/2023);  Goal status: In-progress  4.  Patient will report 50% improvement in her confidence in her ability to implement a flair-up plan to control pain to tolerable  levels while remaining active to improve quality of life and functional participation.  Baseline: patient does not have a flair up plan (09/15/23); Goal status: In-progress  5.  Patient will demonstrate B LE strength to equal or greater than 4/5 with ability to consistently resist force to improve ability to navigate stairs, household and community mobility, and engage in outdoor activities with less risk of falling and improved tolerance.  Baseline: as low as 3+/5 at L LE with inability to maintain resistance in most L LE motions (09/15/2023);  Goal status: In-progress   PLAN:  PT FREQUENCY: 1-2x/week  PT DURATION: 12 weeks  PLANNED INTERVENTIONS: Therapeutic exercises, Therapeutic activity, Neuromuscular re-education, Balance training, Gait training, Patient/Family education, Self Care, Joint mobilization, Stair training, DME instructions, Aquatic Therapy, Dry Needling, Electrical stimulation, Spinal mobilization, Cryotherapy, Moist heat, Manual therapy, and Re-evaluation.  PLAN FOR NEXT SESSION: Update HEP as appropriate, trial of TENS with exercise  to improve tolerance and pain control, graded exercises to address LE/core/functional strength/motor control/balance/neurodyamics/flexibility, education, manual/dry needling as appropriate. Gait training as appropriate.    Cira Rue, PT, DPT 09/23/2023, 11:57 AM  [facility] [address] [place], [place] [address] P: [phone] I F: [phone]

## 2023-09-23 ENCOUNTER — Ambulatory Visit: Payer: Medicare Other | Admitting: Physical Therapy

## 2023-09-23 ENCOUNTER — Encounter: Payer: Self-pay | Admitting: Physical Therapy

## 2023-09-23 VITALS — BP 120/84 | HR 82

## 2023-09-23 DIAGNOSIS — M5459 Other low back pain: Secondary | ICD-10-CM | POA: Diagnosis not present

## 2023-09-23 DIAGNOSIS — R2689 Other abnormalities of gait and mobility: Secondary | ICD-10-CM

## 2023-09-23 DIAGNOSIS — M792 Neuralgia and neuritis, unspecified: Secondary | ICD-10-CM

## 2023-09-23 DIAGNOSIS — M6281 Muscle weakness (generalized): Secondary | ICD-10-CM

## 2023-09-29 ENCOUNTER — Ambulatory Visit: Payer: Medicare Other | Admitting: Physical Therapy

## 2023-09-29 ENCOUNTER — Encounter: Payer: Self-pay | Admitting: Physical Therapy

## 2023-09-29 DIAGNOSIS — M6281 Muscle weakness (generalized): Secondary | ICD-10-CM

## 2023-09-29 DIAGNOSIS — R2689 Other abnormalities of gait and mobility: Secondary | ICD-10-CM

## 2023-09-29 DIAGNOSIS — M5459 Other low back pain: Secondary | ICD-10-CM

## 2023-09-29 DIAGNOSIS — M792 Neuralgia and neuritis, unspecified: Secondary | ICD-10-CM

## 2023-09-29 NOTE — Therapy (Signed)
OUTPATIENT PHYSICAL THERAPY TREATMENT   Patient Name: Yolanda Young MRN: [id number] DOB:Jun 05, 1966, 57 y.o., female Today's Date: 09/29/2023  END OF SESSION:  PT End of Session - 09/29/23 1130     Visit Number 3    Number of Visits 13    Date for PT Re-Evaluation 12/08/23    Authorization Type MEDICARE PART B reporting period from 09/15/2023    Progress Note Due on Visit 10    PT Start Time 1122    PT Stop Time 1200    PT Time Calculation (min) 38 min    Behavior During Therapy [facility] for tasks assessed/performed               Past Medical History:  Diagnosis Date   Colon polyp 02/04/2018   tubular adenoma   GERD (gastroesophageal reflux disease)    History of hiatal hernia    Migraine    weekly   Neck pain    Panic attacks    Vertigo    Past Surgical History:  Procedure Laterality Date   ABLATION ON ENDOMETRIOSIS  2013   [place]   CHOLECYSTECTOMY N/A 04/05/2018   Procedure: LAPAROSCOPIC CHOLECYSTECTOMY WITH INTRAOPERATIVE CHOLANGIOGRAM;  Surgeon: Earline Mayotte, MD;  Location: [facility];  Service: General;  Laterality: N/A;   COLONOSCOPY WITH PROPOFOL N/A 02/04/2018   Procedure: COLONOSCOPY WITH PROPOFOL;  Surgeon: Midge Minium, MD;  Location: [facility];  Service: Endoscopy;  Laterality: N/A;   COLONOSCOPY WITH PROPOFOL N/A 03/09/2023   Procedure: COLONOSCOPY WITH PROPOFOL;  Surgeon: Midge Minium, MD;  Location: [facility];  Service: Endoscopy;  Laterality: N/A;   ESOPHAGOGASTRODUODENOSCOPY (EGD) WITH PROPOFOL N/A 02/04/2018   Procedure: ESOPHAGOGASTRODUODENOSCOPY (EGD) WITH PROPOFOL;  Surgeon: Midge Minium, MD;  Location: [facility];  Service: Endoscopy;  Laterality: N/A;   ESOPHAGOGASTRODUODENOSCOPY (EGD) WITH PROPOFOL N/A 07/03/2020   Procedure: ESOPHAGOGASTRODUODENOSCOPY (EGD) WITH PROPOFOL;  Surgeon: Regis Bill, MD;  Location: [facility];  Service: Endoscopy;  Laterality: N/A;   SHOULDER SURGERY Right    Patient  Active Problem List   Diagnosis Date Noted   Cervical myelopathy with cervical radiculopathy (HCC) 01/27/2023   Muscle weakness of lower extremity 01/27/2023   Numbness and tingling in both hands 01/27/2023   Impaired fasting glucose 01/27/2023   Elevated alkaline phosphatase measurement 09/24/2022   Fibromyalgia 09/23/2022   Chest pain 09/12/2022   Barrett's esophagus without dysplasia 07/06/2020   IBS (irritable bowel syndrome) 07/06/2020   Chronic 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 04/03/2020   Hepatic steatosis 04/03/2020   Neuropathy 03/22/2020   Excessive body weight gain 03/06/2020   Amenorrhea, secondary 03/06/2020   Exertional dyspnea 03/06/2020   Neck pain of over 3 months duration 03/06/2020   History of colonic polyps    Colon cancer screening    Polyp of sigmoid colon    Benign neoplasm of descending colon    Gastritis without bleeding     PCP: [name], MD  REFERRING PROVIDER: [name], MD  REFERRING DIAG: chronic 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  Rationale for Evaluation and Treatment: Rehabilitation  THERAPY DIAG:  Other low back pain  Neuralgia and neuritis  Muscle weakness (generalized)  Other abnormalities of gait and mobility  ONSET DATE:  chronic over years, but worsening over the last year and 6 months  PERTINENT HISTORY:  Patient is a 57 y.o. female who presents to outpatient physical therapy with a referral for medical diagnosis chronic bilateral low back  pain with left-sided sciatica. This patient's chief complaints consist of chronic low back pain with radiation down left > right LE to foot including into groin region, leading to the following functional deficits: ifficulty with day to day activities such as sleeping, laying on left side or right side, mornings, end of day, sitting, standing, stairs, getting up from a chair, getting dressed, bending forwards, twisting, lifting, being outside, working in  the garden, hiking, household and community mobility, social participation, bathing. Relevant past medical history and comorbidities include brain lesions suggestive of MS but inconclusive, Undifferentiated inflammatory arthritis; Positive ANA (antinuclear antibody); Antimitochondrial antibody positive; She is a former smoker who smokes 0.2 ppd for 1 years, for a total of 0.2 pack years;  has History of colonic polyps; Colon cancer screening; Polyp of sigmoid colon; Benign neoplasm of descending colon; Gastritis without bleeding; Excessive body weight gain; Amenorrhea, secondary; Exertional dyspnea; Neck pain of over 3 months duration; Neuropathy; Chronic 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; Hepatic steatosis; Barrett's esophagus without dysplasia; IBS (irritable bowel syndrome); Chest pain; Fibromyalgia; Elevated alkaline phosphatase measurement; Cervical myelopathy with cervical radiculopathy (HCC); Muscle weakness of lower extremity; Numbness and tingling in both hands; and Impaired fasting glucose on their problem list.  has a past medical history of Colon polyp (02/04/2018), GERD (gastroesophageal reflux disease), History of hiatal hernia, Migraine, Neck pain, Panic attacks, and Vertigo.  has a past surgical history that includes Ablation on endometriosis (2013); Shoulder surgery (Right); Colonoscopy with propofol (N/A, 02/04/2018); Esophagogastroduodenoscopy (egd) with propofol (N/A, 02/04/2018); Cholecystectomy (N/A, 04/05/2018); Esophagogastroduodenoscopy (egd) with propofol (N/A, 07/03/2020); and Colonoscopy with propofol (N/A, 03/09/2023). Patient denies hx of stroke, seizures, lung problems, heart problems, diabetes, unexplained weight loss, osteoporosis, and spinal surgery.   Red flags:  Urinary incontinence (large amount) without urge (has been going on for the last 1.5 years). Has had trouble with mixed urinary before but not without the urge. This has been shared with Dr. Darrick Huntsman  by patient  and a Lumbar MRI from 07/2023 showed normal cauda equina.  Stumbling/dropping things: patient has diagnosis of neuropathy, Cervical MRI from 02/02/2023 negative for significant spinal stenosis.   SUBJECTIVE:                                                                                                                                                                                           SUBJECTIVE STATEMENT: Patient states her pain is high and she is feeling very fatigued. She states the fatigue is at the normal level for her which is pretty severe. She states she was hurting bad after last PT session and this continued for at least  a couple of days. She states she got a migraine which she has at least a couple of times a week. She got up early today to vote but has nothing to do after PT today. She did her HEP about 1.5 times and then did not do more with the headaches she was getting. She states she does get flu-like symptoms in the days after cognitive strain or increased physical activity.   Favorite things to do: getting outside and working in the garden. Used to have tons of flowers, tomatoes, cucumbers. Her dad planted some things for her but she cannot keep it up. She states the biggest barriers are constant fatigue and pain with manual exertion. She has a screened in porch where she can sit outside.   PAIN: NPRS: 7/10 lower back and widespread pain  PRECAUTIONS: Fall  PATIENT GOALS: "to get some relief"  NEXT MD VISIT: 12/03/2023  OBJECTIVE   TODAY'S TREATMENT:    Therapeutic exercise: to centralize symptoms and improve ROM, strength, muscular endurance, and activity tolerance required for successful completion of functional activities.  - NuStep level 1 using bilateral upper and lower extremities. Seat/handle setting 10/10. For improved extremity mobility, muscular endurance, and activity tolerance; and to induce the analgesic effect of aerobic exercise, stimulate improved joint  nutrition, and prepare body structures and systems for following interventions. x 5  minutes. Average SPM = 48. Cued to get to 60 or above.  - hooklying low trunk rotation with small ball between knees, 1x3 min at self-selected pace.   - hooklying posterior pelvic tilt (PPT) AROM, 1x10. - hooklying PPT to bridge, with toes lifted, 1x5-7 (discontinued due to painful cramping in lower thoracic region).  - Sidelying open book (thoracic rotation) to improve thoracic, shoulder girdle, and upper trunk mobility. Required instruction for technique and cuing to achieve end range as tolerated, hold time, and breathing technique. - seated rows at [facility] machine, 2x10 with 5#  Pt required multimodal cuing for proper technique and to facilitate improved neuromuscular control, strength, range of motion, and functional ability resulting in improved performance and form.  PATIENT EDUCATION:  Education details: Education on diagnosis, prognosis, POC, anatomy and physiology of current condition.  Person educated: Patient Education method: Explanation Education comprehension: verbalized understanding and needs further education  HOME EXERCISE PROGRAM: Access Code: [email or web address] URL: [email or web address] Date: 09/23/2023 Prepared by: Norton Blizzard  Exercises - Supine Lower Trunk Rotation  - 2 x daily - 1-2 sets - 10 reps - 1-5 seconds hold - Supine Pelvic Tilt  - 2 x daily - 1-2 sets - 10 reps - Hooklying Clamshell with Resistance  - 2 x daily - 1-2 sets - 10 reps - 5 seconds hold  ASSESSMENT:  CLINICAL IMPRESSION: Patient arrives reporting continued high levels of pain and had difficulty with all exercises due to increased pain in multiple while performing them. Patient reported feeling no worse by the end of the session. She also reports heavy constant fatigue and symptoms consistent with possible post-exertional malaise. Exercises were kept very simple and light, and patient encouraged to practice  performing the exercises through a tolerable range of motion. Patient may benefit from trial of manual therapy such as gentle joint mobilizations and/or soft tissue mobilization or use of TENS during exercise to help with pain control. Patient would benefit from continued management of limiting condition by skilled physical therapist to address remaining impairments and functional limitations to work towards stated goals and return to PLOF or maximal functional independence.  From initial PT evaluation on 09/15/2023:  Patient is a 57 y.o. female referred to outpatient physical therapy with a medical diagnosis of chronic 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 who presents with signs and symptoms consistent with chronic low back pain with B radiculopathy (L>R) and neuropathic weakness bilaterally (L > R) and possible left hip pain with multifactorial contributions. Patient has complex history which includes fibromyalgia, peripheral neuropathy, likely inflammatory arthritis and/or autoimmune etiology, and unexplained brain lesions that resemble those found in MS complicating her presentation. She has high pain irritability and objective exam was limited to avoid severe exacerbation of symptoms. She had neuropathic weakness in L > R LE but not loss of sensation to touch. Neurodynamic testing and intra-articular hip testing reserved for later date due to irritability. Patient does report red flags of urinary incontinence without urge and unexplained stumbling and dropping things. Her recent Lumbar MRI showed normal cauda equina, and cervical spine MRI showed no significant spinal stenosis (despite cervical myelopathy being in her list of comorbid conditions). Patient would likely do best with interdisciplinary approach to chronic pain with PT focusing on graded exercise, pain coping planning, education, and multimodal techniques focused on maximization of function for meaningful activities. Patient  presents with significant pain, motor control, posture, ROM, joint stiffness, paresthesia, knowledge, muscle performance (strength/power/endurance), balance, allodynia, hyperesthesia, and activity tolerance impairments that are limiting ability to complete usual day to day activities such as sleeping, laying on left side or right side, mornings, end of day, sitting, standing, stairs, getting up from a chair, getting dressed, bending forwards, twisting, lifting, being outside, working in the garden, hiking, household and community mobility, social participation, bathing without difficulty. Patient will benefit from skilled physical therapy intervention to address current body structure impairments and activity limitations to improve function and work towards goals set in current POC in order to return to prior level of function or maximal functional improvement.   OBJECTIVE IMPAIRMENTS: Abnormal gait, decreased activity tolerance, decreased balance, decreased coordination, decreased endurance, decreased knowledge of condition, decreased mobility, difficulty walking, decreased ROM, decreased strength, impaired perceived functional ability, increased muscle spasms, impaired flexibility, impaired tone, improper body mechanics, obesity, and pain.   ACTIVITY LIMITATIONS: carrying, lifting, bending, sitting, standing, squatting, sleeping, stairs, transfers, bed mobility, continence, bathing, dressing, hygiene/grooming, and locomotion level  PARTICIPATION LIMITATIONS: meal prep, cleaning, laundry, interpersonal relationship, shopping, community activity, yard work, and   difficulty with day to day activities such as sleeping, laying on left side or right side, mornings, end of day, sitting, standing, stairs, getting up from a chair, getting dressed, bending forwards, twisting, lifting, being outside, working in the garden, hiking, household and community mobility, social participation, bathing  PERSONAL FACTORS:  Fitness, Past/current experiences, Time since onset of injury/illness/exacerbation, and 3+ comorbidities:   brain lesions suggestive of MS but inconclusive, Undifferentiated inflammatory arthritis; Positive ANA (antinuclear antibody); Antimitochondrial antibody positive; She is a former smoker who smokes 0.2 ppd for 1 years, for a total of 0.2 pack years;  has History of colonic polyps; Colon cancer screening; Polyp of sigmoid colon; Benign neoplasm of descending colon; Gastritis without bleeding; Excessive body weight gain; Amenorrhea, secondary; Exertional dyspnea; Neck pain of over 3 months duration; Neuropathy; Chronic 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; Hepatic steatosis; Barrett's esophagus without dysplasia; IBS (irritable bowel syndrome); Chest pain; Fibromyalgia; Elevated alkaline phosphatase measurement; Cervical myelopathy with cervical radiculopathy (HCC); Muscle weakness of lower extremity; Numbness and tingling in both hands; and Impaired fasting glucose on their problem  list.  has a past medical history of Colon polyp (02/04/2018), GERD (gastroesophageal reflux disease), History of hiatal hernia, Migraine, Neck pain, Panic attacks, and Vertigo.  has a past surgical history that includes Ablation on endometriosis (2013); Shoulder surgery (Right); Colonoscopy with propofol (N/A, 02/04/2018); Esophagogastroduodenoscopy (egd) with propofol (N/A, 02/04/2018); Cholecystectomy (N/A, 04/05/2018); Esophagogastroduodenoscopy (egd) with propofol (N/A, 07/03/2020); and Colonoscopy with propofol (N/A, 03/09/2023) are also affecting patient's functional outcome.   REHAB POTENTIAL: Fair due to chronic and complex nature of condition and comorbidity  CLINICAL DECISION MAKING: Evolving/moderate complexity  EVALUATION COMPLEXITY: Moderate   GOALS: Goals reviewed with patient? No  SHORT TERM GOALS: Target date: 09/29/2023  Patient will be independent with initial home exercise program for  self-management of symptoms. Baseline: Initial HEP to be provided at visit 2 as appropriate (09/15/23); initial HEP provided (09/23/2023); Goal status: In-progress   LONG TERM GOALS: Target date: 12/08/2023  Patient will be independent with a long-term home exercise program for self-management of symptoms.  Baseline: Initial HEP to be provided at visit 2 as appropriate (09/15/23); initial HEP provided (09/23/2023); Goal status: In-progress  2.  Patient will demonstrate improved FOTO to equal or greater than 42 by visit #12 to demonstrate improvement in overall condition and self-reported functional ability.  Baseline: 31 (09/15/23); Goal status: In-progress  3.  Patient will improve 3 points on the Patient Specific Functional Scale to demonstrate improvement in patient's ability to participate in personally meaningful activities.  Baseline: To be tested at visit two as appropriate (09/15/23); 3.7 at visit #2 (09/23/2023);  Goal status: In-progress  4.  Patient will report 50% improvement in her confidence in her ability to implement a flair-up plan to control pain to tolerable levels while remaining active to improve quality of life and functional participation.  Baseline: patient does not have a flair up plan (09/15/23); Goal status: In-progress  5.  Patient will demonstrate B LE strength to equal or greater than 4/5 with ability to consistently resist force to improve ability to navigate stairs, household and community mobility, and engage in outdoor activities with less risk of falling and improved tolerance.  Baseline: as low as 3+/5 at L LE with inability to maintain resistance in most L LE motions (09/15/2023);  Goal status: In-progress   PLAN:  PT FREQUENCY: 1-2x/week  PT DURATION: 12 weeks  PLANNED INTERVENTIONS: Therapeutic exercises, Therapeutic activity, Neuromuscular re-education, Balance training, Gait training, Patient/Family education, Self Care, Joint mobilization,  Stair training, DME instructions, Aquatic Therapy, Dry Needling, Electrical stimulation, Spinal mobilization, Cryotherapy, Moist heat, Manual therapy, and Re-evaluation.  PLAN FOR NEXT SESSION: Update HEP as appropriate, trial of TENS with exercise to improve tolerance and pain control, graded exercises to address LE/core/functional strength/motor control/balance/neurodyamics/flexibility, education, manual/dry needling as appropriate. Gait training as appropriate.    Cira Rue, PT, DPT 09/29/2023, 7:52 PM  [facility] [address] [place], [place] [address] P: [phone] I F: [phone]

## 2023-10-01 ENCOUNTER — Ambulatory Visit: Payer: Medicare Other | Admitting: Physical Therapy

## 2023-10-01 ENCOUNTER — Encounter: Payer: Self-pay | Admitting: Physical Therapy

## 2023-10-01 DIAGNOSIS — M6281 Muscle weakness (generalized): Secondary | ICD-10-CM

## 2023-10-01 DIAGNOSIS — M792 Neuralgia and neuritis, unspecified: Secondary | ICD-10-CM

## 2023-10-01 DIAGNOSIS — M5459 Other low back pain: Secondary | ICD-10-CM

## 2023-10-01 DIAGNOSIS — R2689 Other abnormalities of gait and mobility: Secondary | ICD-10-CM

## 2023-10-01 NOTE — Therapy (Signed)
OUTPATIENT PHYSICAL THERAPY TREATMENT   Patient Name: Yolanda Young MRN: [id number] DOB:10-16-1966, 57 y.o., female Today's Date: 10/01/2023  END OF SESSION:  PT End of Session - 10/01/23 2048     Visit Number 4    Number of Visits 13    Date for PT Re-Evaluation 12/08/23    Authorization Type MEDICARE PART B reporting period from 09/15/2023    Progress Note Due on Visit 10    PT Start Time 1350    PT Stop Time 1430    PT Time Calculation (min) 40 min    Activity Tolerance Patient limited by pain    Behavior During Therapy [facility] for tasks assessed/performed                Past Medical History:  Diagnosis Date   Colon polyp 02/04/2018   tubular adenoma   GERD (gastroesophageal reflux disease)    History of hiatal hernia    Migraine    weekly   Neck pain    Panic attacks    Vertigo    Past Surgical History:  Procedure Laterality Date   ABLATION ON ENDOMETRIOSIS  2013   [place]   CHOLECYSTECTOMY N/A 04/05/2018   Procedure: LAPAROSCOPIC CHOLECYSTECTOMY WITH INTRAOPERATIVE CHOLANGIOGRAM;  Surgeon: Earline Mayotte, MD;  Location: [facility];  Service: General;  Laterality: N/A;   COLONOSCOPY WITH PROPOFOL N/A 02/04/2018   Procedure: COLONOSCOPY WITH PROPOFOL;  Surgeon: Midge Minium, MD;  Location: [facility];  Service: Endoscopy;  Laterality: N/A;   COLONOSCOPY WITH PROPOFOL N/A 03/09/2023   Procedure: COLONOSCOPY WITH PROPOFOL;  Surgeon: Midge Minium, MD;  Location: [facility];  Service: Endoscopy;  Laterality: N/A;   ESOPHAGOGASTRODUODENOSCOPY (EGD) WITH PROPOFOL N/A 02/04/2018   Procedure: ESOPHAGOGASTRODUODENOSCOPY (EGD) WITH PROPOFOL;  Surgeon: Midge Minium, MD;  Location: [facility];  Service: Endoscopy;  Laterality: N/A;   ESOPHAGOGASTRODUODENOSCOPY (EGD) WITH PROPOFOL N/A 07/03/2020   Procedure: ESOPHAGOGASTRODUODENOSCOPY (EGD) WITH PROPOFOL;  Surgeon: Regis Bill, MD;  Location: [facility];  Service: Endoscopy;  Laterality:  N/A;   SHOULDER SURGERY Right    Patient Active Problem List   Diagnosis Date Noted   Cervical myelopathy with cervical radiculopathy (HCC) 01/27/2023   Muscle weakness of lower extremity 01/27/2023   Numbness and tingling in both hands 01/27/2023   Impaired fasting glucose 01/27/2023   Elevated alkaline phosphatase measurement 09/24/2022   Fibromyalgia 09/23/2022   Chest pain 09/12/2022   Barrett's esophagus without dysplasia 07/06/2020   IBS (irritable bowel syndrome) 07/06/2020   Chronic 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 04/03/2020   Hepatic steatosis 04/03/2020   Neuropathy 03/22/2020   Excessive body weight gain 03/06/2020   Amenorrhea, secondary 03/06/2020   Exertional dyspnea 03/06/2020   Neck pain of over 3 months duration 03/06/2020   History of colonic polyps    Colon cancer screening    Polyp of sigmoid colon    Benign neoplasm of descending colon    Gastritis without bleeding     PCP: [name], MD  REFERRING PROVIDER: [name], MD  REFERRING DIAG: chronic 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  Rationale for Evaluation and Treatment: Rehabilitation  THERAPY DIAG:  Other low back pain  Neuralgia and neuritis  Muscle weakness (generalized)  Other abnormalities of gait and mobility  ONSET DATE:  chronic over years, but worsening over the last year and 6 months  PERTINENT HISTORY:  Patient is a 57 y.o. female who presents to outpatient physical therapy  with a referral for medical diagnosis chronic 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. This patient's chief complaints consist of chronic low back pain with radiation down left > right LE to foot including into groin region, leading to the following functional deficits: ifficulty with day to day activities such as sleeping, laying on left side or right side, mornings, end of day, sitting, standing, stairs, getting up from a chair, getting dressed, bending forwards,  twisting, lifting, being outside, working in the garden, hiking, household and community mobility, social participation, bathing. Relevant past medical history and comorbidities include brain lesions suggestive of MS but inconclusive, Undifferentiated inflammatory arthritis; Positive ANA (antinuclear antibody); Antimitochondrial antibody positive; She is a former smoker who smokes 0.2 ppd for 1 years, for a total of 0.2 pack years;  has History of colonic polyps; Colon cancer screening; Polyp of sigmoid colon; Benign neoplasm of descending colon; Gastritis without bleeding; Excessive body weight gain; Amenorrhea, secondary; Exertional dyspnea; Neck pain of over 3 months duration; Neuropathy; Chronic 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; Hepatic steatosis; Barrett's esophagus without dysplasia; IBS (irritable bowel syndrome); Chest pain; Fibromyalgia; Elevated alkaline phosphatase measurement; Cervical myelopathy with cervical radiculopathy (HCC); Muscle weakness of lower extremity; Numbness and tingling in both hands; and Impaired fasting glucose on their problem list.  has a past medical history of Colon polyp (02/04/2018), GERD (gastroesophageal reflux disease), History of hiatal hernia, Migraine, Neck pain, Panic attacks, and Vertigo.  has a past surgical history that includes Ablation on endometriosis (2013); Shoulder surgery (Right); Colonoscopy with propofol (N/A, 02/04/2018); Esophagogastroduodenoscopy (egd) with propofol (N/A, 02/04/2018); Cholecystectomy (N/A, 04/05/2018); Esophagogastroduodenoscopy (egd) with propofol (N/A, 07/03/2020); and Colonoscopy with propofol (N/A, 03/09/2023). Patient denies hx of stroke, seizures, lung problems, heart problems, diabetes, unexplained weight loss, osteoporosis, and spinal surgery.   Red flags:  Urinary incontinence (large amount) without urge (has been going on for the last 1.5 years). Has had trouble with mixed urinary before but not without the urge. This  has been shared with Dr. Darrick Huntsman  by patient and a Lumbar MRI from 07/2023 showed normal cauda equina.  Stumbling/dropping things: patient has diagnosis of neuropathy, Cervical MRI from 02/02/2023 negative for significant spinal stenosis.   SUBJECTIVE:                                                                                                                                                                                           SUBJECTIVE STATEMENT: Patient states her pain and fatigue is the same as last PT session. She stats her upper back is really hurting today.   Favorite things to do: getting outside and working in  the garden. Used to have tons of flowers, tomatoes, cucumbers. Her dad planted some things for her but she cannot keep it up. She states the biggest barriers are constant fatigue and pain with manual exertion. She has a screened in porch where she can sit outside.   PAIN: NPRS: 6/10 lower back and widespread pain including over bilateral upper back and shoulders  PRECAUTIONS: Fall  PATIENT GOALS: "to get some relief"  NEXT MD VISIT: 12/03/2023  OBJECTIVE  TODAY'S TREATMENT:     Modality: for pain control and muscle relaxation.  TENS estim applied to low back with 2x2 inch square pads while completing exercises as noted below. Two channels (intensity to level 3 and 3 on 2 channels). Mode: modulated, Pulse width: 120 nanoS, Pulse Rate: 110 hz, continuous. Applied throughout session. Pt response: decreased back pain during application of estim. skin within normal limits to visual inspection before and after.  Therapeutic exercise: to centralize symptoms and improve ROM, strength, muscular endurance, and activity tolerance required for successful completion of functional activities.   Moist heat applied to thoracic spine while in hooklying/supine: - hooklying posterior pelvic tilt (PPT) AROM, 1x10. - hooklying double knees to chest with feet on [place] theraball, AROM with  abdominal contraction and breath (inhale on knee extension, exhale on knee flexion) - hookling isometric hip extension with knees extended and ankles on [place] theraball, 1x10 with 4 second holds.   Manual therapy: to reduce pain and tissue tension, improve range of motion, neuromodulation, in order to promote improved ability to complete functional activities. PRONE with pillow under abdomen - CPA grade I-II along thoracic and upper lumbar spine, repetitions over painful regions continued to increase pain and did not decrease pain so discontinued (painful at all levels).  - light STM with and without foam roller to thoracic paraspinals (foam roller felt a little bit good, did not feel good without foam roller).   Pt required multimodal cuing for proper technique and to facilitate improved neuromuscular control, strength, range of motion, and functional ability resulting in improved performance and form.  PATIENT EDUCATION:  Education details: Education on diagnosis, prognosis, POC, anatomy and physiology of current condition.  Person educated: Patient Education method: Explanation Education comprehension: verbalized understanding and needs further education  HOME EXERCISE PROGRAM: Access Code: [email or web address] URL: [email or web address] Date: 09/23/2023 Prepared by: Norton Blizzard  Exercises - Supine Lower Trunk Rotation  - 2 x daily - 1-2 sets - 10 reps - 1-5 seconds hold - Supine Pelvic Tilt  - 2 x daily - 1-2 sets - 10 reps - Hooklying Clamshell with Resistance  - 2 x daily - 1-2 sets - 10 reps - 5 seconds hold  ASSESSMENT:  CLINICAL IMPRESSION: Patient arrives continuing to report high and widespread pain. She continued to have increased pain with all exercises and manual therapy (except with the foam roller). She felt some pain relief with TENS unit while it was applied but not after, light foam rolling felt good, and moist heat felt good. Patient was educated how to use and  where to buy a TENS unit for home use. She continues to appear extremely sensitive to movements in all joints. So far not PT interventions have provided any significant pain relief and in fact most increase her pain and discomfort in the moment. She reported no change in overall pain by end of session. Plan to continue with interventions for pain control and gentle movement and strengthening next session. Plan to consider desensitization techniques using 2 point  discrimination and stereognosis. Patient would benefit from continued management of limiting condition by skilled physical therapist to address remaining impairments and functional limitations to work towards stated goals and return to PLOF or maximal functional independence.   From initial PT evaluation on 09/15/2023:  Patient is a 57 y.o. female referred to outpatient physical therapy with a medical diagnosis of chronic 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 who presents with signs and symptoms consistent with chronic low back pain with B radiculopathy (L>R) and neuropathic weakness bilaterally (L > R) and possible left hip pain with multifactorial contributions. Patient has complex history which includes fibromyalgia, peripheral neuropathy, likely inflammatory arthritis and/or autoimmune etiology, and unexplained brain lesions that resemble those found in MS complicating her presentation. She has high pain irritability and objective exam was limited to avoid severe exacerbation of symptoms. She had neuropathic weakness in L > R LE but not loss of sensation to touch. Neurodynamic testing and intra-articular hip testing reserved for later date due to irritability. Patient does report red flags of urinary incontinence without urge and unexplained stumbling and dropping things. Her recent Lumbar MRI showed normal cauda equina, and cervical spine MRI showed no significant spinal stenosis (despite cervical myelopathy being in her list of comorbid  conditions). Patient would likely do best with interdisciplinary approach to chronic pain with PT focusing on graded exercise, pain coping planning, education, and multimodal techniques focused on maximization of function for meaningful activities. Patient presents with significant pain, motor control, posture, ROM, joint stiffness, paresthesia, knowledge, muscle performance (strength/power/endurance), balance, allodynia, hyperesthesia, and activity tolerance impairments that are limiting ability to complete usual day to day activities such as sleeping, laying on left side or right side, mornings, end of day, sitting, standing, stairs, getting up from a chair, getting dressed, bending forwards, twisting, lifting, being outside, working in the garden, hiking, household and community mobility, social participation, bathing without difficulty. Patient will benefit from skilled physical therapy intervention to address current body structure impairments and activity limitations to improve function and work towards goals set in current POC in order to return to prior level of function or maximal functional improvement.   OBJECTIVE IMPAIRMENTS: Abnormal gait, decreased activity tolerance, decreased balance, decreased coordination, decreased endurance, decreased knowledge of condition, decreased mobility, difficulty walking, decreased ROM, decreased strength, impaired perceived functional ability, increased muscle spasms, impaired flexibility, impaired tone, improper body mechanics, obesity, and pain.   ACTIVITY LIMITATIONS: carrying, lifting, bending, sitting, standing, squatting, sleeping, stairs, transfers, bed mobility, continence, bathing, dressing, hygiene/grooming, and locomotion level  PARTICIPATION LIMITATIONS: meal prep, cleaning, laundry, interpersonal relationship, shopping, community activity, yard work, and   difficulty with day to day activities such as sleeping, laying on left side or right side,  mornings, end of day, sitting, standing, stairs, getting up from a chair, getting dressed, bending forwards, twisting, lifting, being outside, working in the garden, hiking, household and community mobility, social participation, bathing  PERSONAL FACTORS: Fitness, Past/current experiences, Time since onset of injury/illness/exacerbation, and 3+ comorbidities:   brain lesions suggestive of MS but inconclusive, Undifferentiated inflammatory arthritis; Positive ANA (antinuclear antibody); Antimitochondrial antibody positive; She is a former smoker who smokes 0.2 ppd for 1 years, for a total of 0.2 pack years;  has History of colonic polyps; Colon cancer screening; Polyp of sigmoid colon; Benign neoplasm of descending colon; Gastritis without bleeding; Excessive body weight gain; Amenorrhea, secondary; Exertional dyspnea; Neck pain of over 3 months duration; Neuropathy; Chronic 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; Hepatic steatosis; Barrett's  esophagus without dysplasia; IBS (irritable bowel syndrome); Chest pain; Fibromyalgia; Elevated alkaline phosphatase measurement; Cervical myelopathy with cervical radiculopathy (HCC); Muscle weakness of lower extremity; Numbness and tingling in both hands; and Impaired fasting glucose on their problem list.  has a past medical history of Colon polyp (02/04/2018), GERD (gastroesophageal reflux disease), History of hiatal hernia, Migraine, Neck pain, Panic attacks, and Vertigo.  has a past surgical history that includes Ablation on endometriosis (2013); Shoulder surgery (Right); Colonoscopy with propofol (N/A, 02/04/2018); Esophagogastroduodenoscopy (egd) with propofol (N/A, 02/04/2018); Cholecystectomy (N/A, 04/05/2018); Esophagogastroduodenoscopy (egd) with propofol (N/A, 07/03/2020); and Colonoscopy with propofol (N/A, 03/09/2023) are also affecting patient's functional outcome.   REHAB POTENTIAL: Fair due to chronic and complex nature of condition and  comorbidity  CLINICAL DECISION MAKING: Evolving/moderate complexity  EVALUATION COMPLEXITY: Moderate   GOALS: Goals reviewed with patient? No  SHORT TERM GOALS: Target date: 09/29/2023  Patient will be independent with initial home exercise program for self-management of symptoms. Baseline: Initial HEP to be provided at visit 2 as appropriate (09/15/23); initial HEP provided (09/23/2023); Goal status: In-progress   LONG TERM GOALS: Target date: 12/08/2023  Patient will be independent with a long-term home exercise program for self-management of symptoms.  Baseline: Initial HEP to be provided at visit 2 as appropriate (09/15/23); initial HEP provided (09/23/2023); Goal status: In-progress  2.  Patient will demonstrate improved FOTO to equal or greater than 42 by visit #12 to demonstrate improvement in overall condition and self-reported functional ability.  Baseline: 31 (09/15/23); Goal status: In-progress  3.  Patient will improve 3 points on the Patient Specific Functional Scale to demonstrate improvement in patient's ability to participate in personally meaningful activities.  Baseline: To be tested at visit two as appropriate (09/15/23); 3.7 at visit #2 (09/23/2023);  Goal status: In-progress  4.  Patient will report 50% improvement in her confidence in her ability to implement a flair-up plan to control pain to tolerable levels while remaining active to improve quality of life and functional participation.  Baseline: patient does not have a flair up plan (09/15/23); Goal status: In-progress  5.  Patient will demonstrate B LE strength to equal or greater than 4/5 with ability to consistently resist force to improve ability to navigate stairs, household and community mobility, and engage in outdoor activities with less risk of falling and improved tolerance.  Baseline: as low as 3+/5 at L LE with inability to maintain resistance in most L LE motions (09/15/2023);  Goal status:  In-progress   PLAN:  PT FREQUENCY: 1-2x/week  PT DURATION: 12 weeks  PLANNED INTERVENTIONS: Therapeutic exercises, Therapeutic activity, Neuromuscular re-education, Balance training, Gait training, Patient/Family education, Self Care, Joint mobilization, Stair training, DME instructions, Aquatic Therapy, Dry Needling, Electrical stimulation, Spinal mobilization, Cryotherapy, Moist heat, Manual therapy, and Re-evaluation.  PLAN FOR NEXT SESSION: Update HEP as appropriate, trial of TENS with exercise to improve tolerance and pain control, graded exercises to address LE/core/functional strength/motor control/balance/neurodyamics/flexibility, education, manual/dry needling as appropriate. Gait training as appropriate.    Cira Rue, PT, DPT 10/01/2023, 8:56 PM  [facility] [address] [place], [place] [address] P: [phone] I F: [phone]

## 2023-10-06 ENCOUNTER — Ambulatory Visit: Payer: Medicare Other | Admitting: Physical Therapy

## 2023-10-06 ENCOUNTER — Encounter: Payer: Self-pay | Admitting: Physical Therapy

## 2023-10-06 DIAGNOSIS — M6281 Muscle weakness (generalized): Secondary | ICD-10-CM

## 2023-10-06 DIAGNOSIS — R2689 Other abnormalities of gait and mobility: Secondary | ICD-10-CM

## 2023-10-06 DIAGNOSIS — M792 Neuralgia and neuritis, unspecified: Secondary | ICD-10-CM

## 2023-10-06 DIAGNOSIS — M5459 Other low back pain: Secondary | ICD-10-CM | POA: Diagnosis not present

## 2023-10-06 NOTE — Therapy (Signed)
OUTPATIENT PHYSICAL THERAPY TREATMENT   Patient Name: CHIMENE LYVERS MRN: [id number] DOB:1966-11-12, 57 y.o., female Today's Date: 10/06/2023  END OF SESSION:  PT End of Session - 10/06/23 1041     Visit Number 5    Number of Visits 13    Date for PT Re-Evaluation 12/08/23    Authorization Type MEDICARE PART B reporting period from 09/15/2023    Progress Note Due on Visit 10    PT Start Time 1038    PT Stop Time 1116    PT Time Calculation (min) 38 min    Activity Tolerance Patient limited by pain    Behavior During Therapy [facility] for tasks assessed/performed                 Past Medical History:  Diagnosis Date   Colon polyp 02/04/2018   tubular adenoma   GERD (gastroesophageal reflux disease)    History of hiatal hernia    Migraine    weekly   Neck pain    Panic attacks    Vertigo    Past Surgical History:  Procedure Laterality Date   ABLATION ON ENDOMETRIOSIS  2013   [place]   CHOLECYSTECTOMY N/A 04/05/2018   Procedure: LAPAROSCOPIC CHOLECYSTECTOMY WITH INTRAOPERATIVE CHOLANGIOGRAM;  Surgeon: Earline Mayotte, MD;  Location: [facility];  Service: General;  Laterality: N/A;   COLONOSCOPY WITH PROPOFOL N/A 02/04/2018   Procedure: COLONOSCOPY WITH PROPOFOL;  Surgeon: Midge Minium, MD;  Location: [facility];  Service: Endoscopy;  Laterality: N/A;   COLONOSCOPY WITH PROPOFOL N/A 03/09/2023   Procedure: COLONOSCOPY WITH PROPOFOL;  Surgeon: Midge Minium, MD;  Location: [facility];  Service: Endoscopy;  Laterality: N/A;   ESOPHAGOGASTRODUODENOSCOPY (EGD) WITH PROPOFOL N/A 02/04/2018   Procedure: ESOPHAGOGASTRODUODENOSCOPY (EGD) WITH PROPOFOL;  Surgeon: Midge Minium, MD;  Location: [facility];  Service: Endoscopy;  Laterality: N/A;   ESOPHAGOGASTRODUODENOSCOPY (EGD) WITH PROPOFOL N/A 07/03/2020   Procedure: ESOPHAGOGASTRODUODENOSCOPY (EGD) WITH PROPOFOL;  Surgeon: Regis Bill, MD;  Location: [facility] END[facility];  Service: Endoscopy;   Laterality: N/A;   SHOULDER SURGERY Right    Patient Active Problem List   Diagnosis Date Noted   Cervical myelopathy with cervical radiculopathy (HCC) 01/27/2023   Muscle weakness of lower extremity 01/27/2023   Numbness and tingling in both hands 01/27/2023   Impaired fasting glucose 01/27/2023   Elevated alkaline phosphatase measurement 09/24/2022   Fibromyalgia 09/23/2022   Chest pain 09/12/2022   Barrett's esophagus without dysplasia 07/06/2020   IBS (irritable bowel syndrome) 07/06/2020   Chronic 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 04/03/2020   Hepatic steatosis 04/03/2020   Neuropathy 03/22/2020   Excessive body weight gain 03/06/2020   Amenorrhea, secondary 03/06/2020   Exertional dyspnea 03/06/2020   Neck pain of over 3 months duration 03/06/2020   History of colonic polyps    Colon cancer screening    Polyp of sigmoid colon    Benign neoplasm of descending colon    Gastritis without bleeding     PCP: [name], MD  REFERRING PROVIDER: [name], MD  REFERRING DIAG: chronic 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  Rationale for Evaluation and Treatment: Rehabilitation  THERAPY DIAG:  Other low back pain  Neuralgia and neuritis  Muscle weakness (generalized)  Other abnormalities of gait and mobility  ONSET DATE:  chronic over years, but worsening over the last year and 6 months  PERTINENT HISTORY:  Patient is a 57 y.o. female who presents to outpatient physical  therapy with a referral for medical diagnosis chronic 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. This patient's chief complaints consist of chronic low back pain with radiation down left > right LE to foot including into groin region, leading to the following functional deficits: ifficulty with day to day activities such as sleeping, laying on left side or right side, mornings, end of day, sitting, standing, stairs, getting up from a chair, getting dressed,  bending forwards, twisting, lifting, being outside, working in the garden, hiking, household and community mobility, social participation, bathing. Relevant past medical history and comorbidities include brain lesions suggestive of MS but inconclusive, Undifferentiated inflammatory arthritis; Positive ANA (antinuclear antibody); Antimitochondrial antibody positive; She is a former smoker who smokes 0.2 ppd for 1 years, for a total of 0.2 pack years;  has History of colonic polyps; Colon cancer screening; Polyp of sigmoid colon; Benign neoplasm of descending colon; Gastritis without bleeding; Excessive body weight gain; Amenorrhea, secondary; Exertional dyspnea; Neck pain of over 3 months duration; Neuropathy; Chronic 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; Hepatic steatosis; Barrett's esophagus without dysplasia; IBS (irritable bowel syndrome); Chest pain; Fibromyalgia; Elevated alkaline phosphatase measurement; Cervical myelopathy with cervical radiculopathy (HCC); Muscle weakness of lower extremity; Numbness and tingling in both hands; and Impaired fasting glucose on their problem list.  has a past medical history of Colon polyp (02/04/2018), GERD (gastroesophageal reflux disease), History of hiatal hernia, Migraine, Neck pain, Panic attacks, and Vertigo.  has a past surgical history that includes Ablation on endometriosis (2013); Shoulder surgery (Right); Colonoscopy with propofol (N/A, 02/04/2018); Esophagogastroduodenoscopy (egd) with propofol (N/A, 02/04/2018); Cholecystectomy (N/A, 04/05/2018); Esophagogastroduodenoscopy (egd) with propofol (N/A, 07/03/2020); and Colonoscopy with propofol (N/A, 03/09/2023). Patient denies hx of stroke, seizures, lung problems, heart problems, diabetes, unexplained weight loss, osteoporosis, and spinal surgery.   Red flags:  Urinary incontinence (large amount) without urge (has been going on for the last 1.5 years). Has had trouble with mixed urinary before but not  without the urge. This has been shared with Dr. Darrick Huntsman  by patient and a Lumbar MRI from 07/2023 showed normal cauda equina.  Stumbling/dropping things: patient has diagnosis of neuropathy, Cervical MRI from 02/02/2023 negative for significant spinal stenosis.   SUBJECTIVE:                                                                                                                                                                                           SUBJECTIVE STATEMENT: Patient states she felt no worse after last PT session. However, she had sudden left sided cramping pain near her TL junction when she went to make her bed this morning. She  states this happens 1-2x a month and is very painful. It can last for days. She has been doing her HEP since last PT session. She cannot tell that PT is helping her but she does not think it is making things worse either.   Favorite things to do: getting outside and working in the garden. Used to have tons of flowers, tomatoes, cucumbers. Her dad planted some things for her but she cannot keep it up. She states the biggest barriers are constant fatigue and pain with manual exertion. She has a screened in porch where she can sit outside.   PAIN: NPRS: 8/10 left TL region cramping sensation, exacerbated by movement and breath  PRECAUTIONS: Fall  PATIENT GOALS: "to get some relief"  NEXT MD VISIT: 12/03/2023  OBJECTIVE  TODAY'S TREATMENT:     Manual therapy: to reduce pain and tissue tension, improve range of motion, neuromodulation, in order to promote improved ability to complete functional activities. SEATED on massage chair - Gentle STM over bilateral thoracic and lumbar paraspinals and QL (L > R with particular attention to most tender portion at right side from about T10 to L4) with skin contact and use of freeup lotion.  - CPA and left UPA grade I-II along lower thoracic and upper lumbar spine, 1-3x30-40 reps per segment (improves slightly with  reps), with concordant pain most strongly reproduced at approx T10 and more attention there.   Therapeutic exercise: to centralize symptoms and improve ROM, strength, muscular endurance, and activity tolerance required for successful completion of functional activities.  - seated left thoracic rotation, 4x10  - seated thoracic extension over towel roll at approx T10 in chair, 2x10 - Education on HEP including handout  (foam roller felt a little bit good, did not feel good without foam roller).  Pt required multimodal cuing for proper technique and to facilitate improved neuromuscular control, strength, range of motion, and functional ability resulting in improved performance and form.  PATIENT EDUCATION:  Education details: Education on diagnosis, prognosis, POC, anatomy and physiology of current condition.  Person educated: Patient Education method: Explanation Education comprehension: verbalized understanding and needs further education  HOME EXERCISE PROGRAM: Access Code: [email or web address] URL: [email or web address] Date: 09/23/2023 Prepared by: Norton Blizzard  Exercises - Supine Lower Trunk Rotation  - 2 x daily - 1-2 sets - 10 reps - 1-5 seconds hold - Supine Pelvic Tilt  - 2 x daily - 1-2 sets - 10 reps - Hooklying Clamshell with Resistance  - 2 x daily - 1-2 sets - 10 reps - 5 seconds hold  ASSESSMENT:  CLINICAL IMPRESSION: Patient arrives reporting high and acute exacerbation of pain at the left TL junction region after attempting to make her bed this morning. She demonstrated high level of pain behaviors including guarding, whimpering, groaning, and grimacing with basic mobility such as sit <> stand and getting on/off massage chair as well as when she experienced pain with therapeutic exercises. Massage chair was used instead of prone position to help accommodate her pain and very gentle STM and joint mobilizations were utilized to try to help calm the pain. She continued to  be TTP over left lower thoracic and lumbar paraspinals and QL with STM. She had reproduction of pain with radiation to left lumbothoracic region with gentle CPAs and UPAs to the lower thoracic and upper lumbar spine, with most sensitive level approximately T10. Manual therapy ultimately seemed unsuccessful in decreasing her pain, although she did report temporary reduction in initial pain with repeated CPAs to  each level. Attempted repeated motion in rotation towards pain and in extension. Both were very painful for patient to perform as she approached end range, but she continued to attempt to increase ROM with each rep with encouragement to do so within her pain tolerance. The improvement in motion in flexion and rotation did not appear to be lasting. She was completely intolerant of right trunk rotation so it was abandoned after the first rep attempt. Patient educated to continue with repeated motions at home as tolerated as they seemed to localize the pain compared to when she arrived. Patient continues to struggle with tolerance for PT. The nature and complexity of her condition would suggest it would improve slowly over prolonged effort and time even with successful intervention. Plan to continue monitoring her response and send her back for further medical assessment if unable to demonstrate significant improvements by visit #10. Patient would benefit from continued management of limiting condition by skilled physical therapist to address remaining impairments and functional limitations to work towards stated goals and return to PLOF or maximal functional independence.   From initial PT evaluation on 09/15/2023:  Patient is a 57 y.o. female referred to outpatient physical therapy with a medical diagnosis of chronic 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 who presents with signs and symptoms consistent with chronic low back pain with B radiculopathy (L>R) and neuropathic weakness bilaterally (L > R)  and possible left hip pain with multifactorial contributions. Patient has complex history which includes fibromyalgia, peripheral neuropathy, likely inflammatory arthritis and/or autoimmune etiology, and unexplained brain lesions that resemble those found in MS complicating her presentation. She has high pain irritability and objective exam was limited to avoid severe exacerbation of symptoms. She had neuropathic weakness in L > R LE but not loss of sensation to touch. Neurodynamic testing and intra-articular hip testing reserved for later date due to irritability. Patient does report red flags of urinary incontinence without urge and unexplained stumbling and dropping things. Her recent Lumbar MRI showed normal cauda equina, and cervical spine MRI showed no significant spinal stenosis (despite cervical myelopathy being in her list of comorbid conditions). Patient would likely do best with interdisciplinary approach to chronic pain with PT focusing on graded exercise, pain coping planning, education, and multimodal techniques focused on maximization of function for meaningful activities. Patient presents with significant pain, motor control, posture, ROM, joint stiffness, paresthesia, knowledge, muscle performance (strength/power/endurance), balance, allodynia, hyperesthesia, and activity tolerance impairments that are limiting ability to complete usual day to day activities such as sleeping, laying on left side or right side, mornings, end of day, sitting, standing, stairs, getting up from a chair, getting dressed, bending forwards, twisting, lifting, being outside, working in the garden, hiking, household and community mobility, social participation, bathing without difficulty. Patient will benefit from skilled physical therapy intervention to address current body structure impairments and activity limitations to improve function and work towards goals set in current POC in order to return to prior level of  function or maximal functional improvement.   OBJECTIVE IMPAIRMENTS: Abnormal gait, decreased activity tolerance, decreased balance, decreased coordination, decreased endurance, decreased knowledge of condition, decreased mobility, difficulty walking, decreased ROM, decreased strength, impaired perceived functional ability, increased muscle spasms, impaired flexibility, impaired tone, improper body mechanics, obesity, and pain.   ACTIVITY LIMITATIONS: carrying, lifting, bending, sitting, standing, squatting, sleeping, stairs, transfers, bed mobility, continence, bathing, dressing, hygiene/grooming, and locomotion level  PARTICIPATION LIMITATIONS: meal prep, cleaning, laundry, interpersonal relationship, shopping, community activity, yard work, and  difficulty with day to day activities such as sleeping, laying on left side or right side, mornings, end of day, sitting, standing, stairs, getting up from a chair, getting dressed, bending forwards, twisting, lifting, being outside, working in the garden, hiking, household and community mobility, social participation, bathing  PERSONAL FACTORS: Fitness, Past/current experiences, Time since onset of injury/illness/exacerbation, and 3+ comorbidities:   brain lesions suggestive of MS but inconclusive, Undifferentiated inflammatory arthritis; Positive ANA (antinuclear antibody); Antimitochondrial antibody positive; She is a former smoker who smokes 0.2 ppd for 1 years, for a total of 0.2 pack years;  has History of colonic polyps; Colon cancer screening; Polyp of sigmoid colon; Benign neoplasm of descending colon; Gastritis without bleeding; Excessive body weight gain; Amenorrhea, secondary; Exertional dyspnea; Neck pain of over 3 months duration; Neuropathy; Chronic 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; Hepatic steatosis; Barrett's esophagus without dysplasia; IBS (irritable bowel syndrome); Chest pain; Fibromyalgia; Elevated alkaline phosphatase  measurement; Cervical myelopathy with cervical radiculopathy (HCC); Muscle weakness of lower extremity; Numbness and tingling in both hands; and Impaired fasting glucose on their problem list.  has a past medical history of Colon polyp (02/04/2018), GERD (gastroesophageal reflux disease), History of hiatal hernia, Migraine, Neck pain, Panic attacks, and Vertigo.  has a past surgical history that includes Ablation on endometriosis (2013); Shoulder surgery (Right); Colonoscopy with propofol (N/A, 02/04/2018); Esophagogastroduodenoscopy (egd) with propofol (N/A, 02/04/2018); Cholecystectomy (N/A, 04/05/2018); Esophagogastroduodenoscopy (egd) with propofol (N/A, 07/03/2020); and Colonoscopy with propofol (N/A, 03/09/2023) are also affecting patient's functional outcome.   REHAB POTENTIAL: Fair due to chronic and complex nature of condition and comorbidity  CLINICAL DECISION MAKING: Evolving/moderate complexity  EVALUATION COMPLEXITY: Moderate   GOALS: Goals reviewed with patient? No  SHORT TERM GOALS: Target date: 09/29/2023  Patient will be independent with initial home exercise program for self-management of symptoms. Baseline: Initial HEP to be provided at visit 2 as appropriate (09/15/23); initial HEP provided (09/23/2023); Goal status: In-progress   LONG TERM GOALS: Target date: 12/08/2023  Patient will be independent with a long-term home exercise program for self-management of symptoms.  Baseline: Initial HEP to be provided at visit 2 as appropriate (09/15/23); initial HEP provided (09/23/2023); Goal status: In-progress  2.  Patient will demonstrate improved FOTO to equal or greater than 42 by visit #12 to demonstrate improvement in overall condition and self-reported functional ability.  Baseline: 31 (09/15/23); Goal status: In-progress  3.  Patient will improve 3 points on the Patient Specific Functional Scale to demonstrate improvement in patient's ability to participate in personally  meaningful activities.  Baseline: To be tested at visit two as appropriate (09/15/23); 3.7 at visit #2 (09/23/2023);  Goal status: In-progress  4.  Patient will report 50% improvement in her confidence in her ability to implement a flair-up plan to control pain to tolerable levels while remaining active to improve quality of life and functional participation.  Baseline: patient does not have a flair up plan (09/15/23); Goal status: In-progress  5.  Patient will demonstrate B LE strength to equal or greater than 4/5 with ability to consistently resist force to improve ability to navigate stairs, household and community mobility, and engage in outdoor activities with less risk of falling and improved tolerance.  Baseline: as low as 3+/5 at L LE with inability to maintain resistance in most L LE motions (09/15/2023);  Goal status: In-progress   PLAN:  PT FREQUENCY: 1-2x/week  PT DURATION: 12 weeks  PLANNED INTERVENTIONS: Therapeutic exercises, Therapeutic activity, Neuromuscular re-education, Balance training, Gait training,  Patient/Family education, Self Care, Joint mobilization, Stair training, DME instructions, Aquatic Therapy, Dry Needling, Electrical stimulation, Spinal mobilization, Cryotherapy, Moist heat, Manual therapy, and Re-evaluation.  PLAN FOR NEXT SESSION: Update HEP as appropriate, trial of TENS with exercise to improve tolerance and pain control, graded exercises to address LE/core/functional strength/motor control/balance/neurodyamics/flexibility, education, manual/dry needling as appropriate. Gait training as appropriate.    Cira Rue, PT, DPT 10/06/2023, 8:56 PM  [facility] [address] [place], [place] [address] P: [phone] I F: [phone]

## 2023-10-08 ENCOUNTER — Encounter: Payer: Self-pay | Admitting: Physical Therapy

## 2023-10-08 ENCOUNTER — Ambulatory Visit: Payer: Medicare Other | Admitting: Physical Therapy

## 2023-10-08 DIAGNOSIS — M5459 Other low back pain: Secondary | ICD-10-CM

## 2023-10-08 DIAGNOSIS — M6281 Muscle weakness (generalized): Secondary | ICD-10-CM

## 2023-10-08 DIAGNOSIS — R2689 Other abnormalities of gait and mobility: Secondary | ICD-10-CM

## 2023-10-08 DIAGNOSIS — M792 Neuralgia and neuritis, unspecified: Secondary | ICD-10-CM

## 2023-10-08 NOTE — Therapy (Addendum)
OUTPATIENT PHYSICAL THERAPY TREATMENT / DISCHARGE SUMMARY Dates of reporting from 09/15/2023 to 10/08/2023   Patient Name: Yolanda Young MRN: [id number] DOB:May 16, 1966, 57 y.o., female Today's Date: 10/08/2023  END OF SESSION:  PT End of Session - 10/08/23 0956     Visit Number 6    Number of Visits 13    Date for PT Re-Evaluation 12/08/23    Authorization Type MEDICARE PART B reporting period from 09/15/2023    Progress Note Due on Visit 10    PT Start Time 0950    PT Stop Time 1028    PT Time Calculation (min) 38 min    Activity Tolerance Patient limited by pain    Behavior During Therapy [facility] for tasks assessed/performed                  Past Medical History:  Diagnosis Date   Colon polyp 02/04/2018   tubular adenoma   GERD (gastroesophageal reflux disease)    History of hiatal hernia    Migraine    weekly   Neck pain    Panic attacks    Vertigo    Past Surgical History:  Procedure Laterality Date   ABLATION ON ENDOMETRIOSIS  2013   [place]   CHOLECYSTECTOMY N/A 04/05/2018   Procedure: LAPAROSCOPIC CHOLECYSTECTOMY WITH INTRAOPERATIVE CHOLANGIOGRAM;  Surgeon: Earline Mayotte, MD;  Location: [facility];  Service: General;  Laterality: N/A;   COLONOSCOPY WITH PROPOFOL N/A 02/04/2018   Procedure: COLONOSCOPY WITH PROPOFOL;  Surgeon: Midge Minium, MD;  Location: [facility];  Service: Endoscopy;  Laterality: N/A;   COLONOSCOPY WITH PROPOFOL N/A 03/09/2023   Procedure: COLONOSCOPY WITH PROPOFOL;  Surgeon: Midge Minium, MD;  Location: [facility];  Service: Endoscopy;  Laterality: N/A;   ESOPHAGOGASTRODUODENOSCOPY (EGD) WITH PROPOFOL N/A 02/04/2018   Procedure: ESOPHAGOGASTRODUODENOSCOPY (EGD) WITH PROPOFOL;  Surgeon: Midge Minium, MD;  Location: [facility];  Service: Endoscopy;  Laterality: N/A;   ESOPHAGOGASTRODUODENOSCOPY (EGD) WITH PROPOFOL N/A 07/03/2020   Procedure: ESOPHAGOGASTRODUODENOSCOPY (EGD) WITH PROPOFOL;  Surgeon: Regis Bill, MD;  Location: [facility];  Service: Endoscopy;  Laterality: N/A;   SHOULDER SURGERY Right    Patient Active Problem List   Diagnosis Date Noted   Cervical myelopathy with cervical radiculopathy (HCC) 01/27/2023   Muscle weakness of lower extremity 01/27/2023   Numbness and tingling in both hands 01/27/2023   Impaired fasting glucose 01/27/2023   Elevated alkaline phosphatase measurement 09/24/2022   Fibromyalgia 09/23/2022   Chest pain 09/12/2022   Barrett's esophagus without dysplasia 07/06/2020   IBS (irritable bowel syndrome) 07/06/2020   Chronic 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 04/03/2020   Hepatic steatosis 04/03/2020   Neuropathy 03/22/2020   Excessive body weight gain 03/06/2020   Amenorrhea, secondary 03/06/2020   Exertional dyspnea 03/06/2020   Neck pain of over 3 months duration 03/06/2020   History of colonic polyps    Colon cancer screening    Polyp of sigmoid colon    Benign neoplasm of descending colon    Gastritis without bleeding     PCP: [name], MD  REFERRING PROVIDER: [name], MD  REFERRING DIAG: chronic 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  Rationale for Evaluation and Treatment: Rehabilitation  THERAPY DIAG:  Other low back pain  Neuralgia and neuritis  Muscle weakness (generalized)  Other abnormalities of gait and mobility  ONSET DATE:  chronic over years, but worsening over the last year and 6 months  PERTINENT HISTORY:  Patient is a 57 y.o. female who presents to outpatient physical therapy with a referral for medical diagnosis chronic 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. This patient's chief complaints consist of chronic low back pain with radiation down left > right LE to foot including into groin region, leading to the following functional deficits: ifficulty with day to day activities such as sleeping, laying on left side or right side, mornings, end of day, sitting,  standing, stairs, getting up from a chair, getting dressed, bending forwards, twisting, lifting, being outside, working in the garden, hiking, household and community mobility, social participation, bathing. Relevant past medical history and comorbidities include brain lesions suggestive of MS but inconclusive, Undifferentiated inflammatory arthritis; Positive ANA (antinuclear antibody); Antimitochondrial antibody positive; She is a former smoker who smokes 0.2 ppd for 1 years, for a total of 0.2 pack years;  has History of colonic polyps; Colon cancer screening; Polyp of sigmoid colon; Benign neoplasm of descending colon; Gastritis without bleeding; Excessive body weight gain; Amenorrhea, secondary; Exertional dyspnea; Neck pain of over 3 months duration; Neuropathy; Chronic 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; Hepatic steatosis; Barrett's esophagus without dysplasia; IBS (irritable bowel syndrome); Chest pain; Fibromyalgia; Elevated alkaline phosphatase measurement; Cervical myelopathy with cervical radiculopathy (HCC); Muscle weakness of lower extremity; Numbness and tingling in both hands; and Impaired fasting glucose on their problem list.  has a past medical history of Colon polyp (02/04/2018), GERD (gastroesophageal reflux disease), History of hiatal hernia, Migraine, Neck pain, Panic attacks, and Vertigo.  has a past surgical history that includes Ablation on endometriosis (2013); Shoulder surgery (Right); Colonoscopy with propofol (N/A, 02/04/2018); Esophagogastroduodenoscopy (egd) with propofol (N/A, 02/04/2018); Cholecystectomy (N/A, 04/05/2018); Esophagogastroduodenoscopy (egd) with propofol (N/A, 07/03/2020); and Colonoscopy with propofol (N/A, 03/09/2023). Patient denies hx of stroke, seizures, lung problems, heart problems, diabetes, unexplained weight loss, osteoporosis, and spinal surgery.   Red flags:  Urinary incontinence (large amount) without urge (has been going on for the last 1.5  years). Has had trouble with mixed urinary before but not without the urge. This has been shared with Dr. Darrick Huntsman  by patient and a Lumbar MRI from 07/2023 showed normal cauda equina.  Stumbling/dropping things: patient has diagnosis of neuropathy, Cervical MRI from 02/02/2023 negative for significant spinal stenosis.   SUBJECTIVE:                                                                                                                                                                                           SUBJECTIVE STATEMENT: Patient states she continues to have a high level of pain especially in the last . She states the exercises at  home did not seem to make her worse. She cannot tell that PT is helping her. She did not really find the TENS unit helpful.   Favorite things to do: getting outside and working in the garden. Used to have tons of flowers, tomatoes, cucumbers. Her dad planted some things for her but she cannot keep it up. She states the biggest barriers are constant fatigue and pain with manual exertion. She has a screened in porch where she can sit outside.   PAIN: NPRS: 6/10 left TL region cramping sensation, exacerbated by movement and breath  PRECAUTIONS: Fall  PATIENT GOALS: "to get some relief"  NEXT MD VISIT: 12/03/2023  OBJECTIVE  SELF-REPORTED FUNCTION FOTO score: 30/100 (lumbar spine questionnaire)  TODAY'S TREATMENT:     Therapeutic exercise: to centralize symptoms and improve ROM, strength, muscular endurance, and activity tolerance required for successful completion of functional activities.  - NuStep level 1 using bilateral upper and lower extremities. Seat/handle setting 10/10. For improved extremity mobility, muscular endurance, and activity tolerance; and to induce the analgesic effect of aerobic exercise, stimulate improved joint nutrition, and prepare body structures and systems for following interventions. x 8  minutes. Average SPM = 56. Cued to get  to 60 or above. Moist heat applied at low back  Therapeutic exercise: to centralize symptoms and improve ROM, strength, muscular endurance, and activity tolerance required for successful completion of functional activities.  - quadruped  on elbows, breathing focusing on expanding posterior torso and into low back with inhale and pulling naval to spine and activating abdominal muscles on exhale. 2x1 minute with multimodal cuing to improve core activation (painful with core activation).  - hooklying diaphragmatic breathing focusing on relaxation of belly and pelvic floor with inhale and pulling naval to spine and activating abdominal muscles/pelvic floor on exhale. 3x10 breaths. Patient with very high pain over bladder with lower abdominal contraction, no worse afterwards. Moist heat applied at low back.   Pt required multimodal cuing for proper technique and to facilitate improved neuromuscular control, strength, range of motion, and functional ability resulting in improved performance and form.  PATIENT EDUCATION:  Education details: Exercise purpose/form. Self management techniques. Discharge recommendations.  Education on HEP including handout.  Person educated: Patient Education method: Explanation Education comprehension: verbalized understanding and needs further education  HOME EXERCISE PROGRAM: Access Code: [email or web address] URL: [email or web address] Date: 10/08/2023 Prepared by: Norton Blizzard  Exercises - Supine Lower Trunk Rotation  - 2 x daily - 1-2 sets - 10 reps - 1-5 seconds hold - Supine Pelvic Tilt  - 2 x daily - 1-2 sets - 10 reps - Hooklying Clamshell with Resistance  - 2 x daily - 1-2 sets - 10 reps - 5 seconds hold - Supine Diaphragmatic Breathing  - 2 x daily - 2 sets - 10 breaths - Quadruped Diaphragmatic Breathing  - 2 x daily - 2 sets - 10 breaths  ASSESSMENT:  CLINICAL IMPRESSION: Patient has attended 6 physical therapy session since starting current episode  of care on 09/15/2023. Multiple modalities have been attempted including moist heat, breathing in several positions, TENS unit, gentle exercises, repeated motions, aerobic exercise, gentle joint mobilizations (grade I-II), and gentle soft tissue mobilizations. However, patient has felt increased pain with all interventions with no significant improvement in symptoms or function afterwards. She appears to have bladder pain today that is worsened with breathing with core activation. She continues to have widespread allodynia and hyperalgesia with even minimal joint movements. Further physical therapy is not indicated at  this time due to poor tolerance. Patient educated on appropriate gentle HEP that she can continue at home as she is able and pacing techniques. Patient also educated on recommendation to seek mental health counseling support for her chronic illness and chronic pain to improve coping techniques and resilience. Patient recommended to continue working with medical team to help improve underlying cause of joint sensitivity and bladder sensitivity and to return to PT if there is a change where she is able to tolerate gentle movements more easily. She is now discharged from PT due to education being completed and poor tolerance for available interventions.   OBJECTIVE IMPAIRMENTS: Abnormal gait, decreased activity tolerance, decreased balance, decreased coordination, decreased endurance, decreased knowledge of condition, decreased mobility, difficulty walking, decreased ROM, decreased strength, impaired perceived functional ability, increased muscle spasms, impaired flexibility, impaired tone, improper body mechanics, obesity, and pain.   ACTIVITY LIMITATIONS: carrying, lifting, bending, sitting, standing, squatting, sleeping, stairs, transfers, bed mobility, continence, bathing, dressing, hygiene/grooming, and locomotion level  PARTICIPATION LIMITATIONS: meal prep, cleaning, laundry, interpersonal  relationship, shopping, community activity, yard work, and   difficulty with day to day activities such as sleeping, laying on left side or right side, mornings, end of day, sitting, standing, stairs, getting up from a chair, getting dressed, bending forwards, twisting, lifting, being outside, working in the garden, hiking, household and community mobility, social participation, bathing  PERSONAL FACTORS: Fitness, Past/current experiences, Time since onset of injury/illness/exacerbation, and 3+ comorbidities:   brain lesions suggestive of MS but inconclusive, Undifferentiated inflammatory arthritis; Positive ANA (antinuclear antibody); Antimitochondrial antibody positive; She is a former smoker who smokes 0.2 ppd for 1 years, for a total of 0.2 pack years;  has History of colonic polyps; Colon cancer screening; Polyp of sigmoid colon; Benign neoplasm of descending colon; Gastritis without bleeding; Excessive body weight gain; Amenorrhea, secondary; Exertional dyspnea; Neck pain of over 3 months duration; Neuropathy; Chronic 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; Hepatic steatosis; Barrett's esophagus without dysplasia; IBS (irritable bowel syndrome); Chest pain; Fibromyalgia; Elevated alkaline phosphatase measurement; Cervical myelopathy with cervical radiculopathy (HCC); Muscle weakness of lower extremity; Numbness and tingling in both hands; and Impaired fasting glucose on their problem list.  has a past medical history of Colon polyp (02/04/2018), GERD (gastroesophageal reflux disease), History of hiatal hernia, Migraine, Neck pain, Panic attacks, and Vertigo.  has a past surgical history that includes Ablation on endometriosis (2013); Shoulder surgery (Right); Colonoscopy with propofol (N/A, 02/04/2018); Esophagogastroduodenoscopy (egd) with propofol (N/A, 02/04/2018); Cholecystectomy (N/A, 04/05/2018); Esophagogastroduodenoscopy (egd) with propofol (N/A, 07/03/2020); and Colonoscopy with propofol (N/A,  03/09/2023) are also affecting patient's functional outcome.   REHAB POTENTIAL: Fair due to chronic and complex nature of condition and comorbidity  CLINICAL DECISION MAKING: Evolving/moderate complexity  EVALUATION COMPLEXITY: Moderate   GOALS: Goals reviewed with patient? No  SHORT TERM GOALS: Target date: 09/29/2023  Patient will be independent with initial home exercise program for self-management of symptoms. Baseline: Initial HEP to be provided at visit 2 as appropriate (09/15/23); initial HEP provided (09/23/2023); Goal status: MET   LONG TERM GOALS: Target date: 12/08/2023  Patient will be independent with a long-term home exercise program for self-management of symptoms.  Baseline: Initial HEP to be provided at visit 2 as appropriate (09/15/23); initial HEP provided (09/23/2023); participating as able but not tolerating well (10/08/2023);  Goal status: not met  2.  Patient will demonstrate improved FOTO to equal or greater than 42 by visit #12 to demonstrate improvement in overall condition and self-reported  functional ability.  Baseline: 31 (09/15/23); 30 at visit # 6 (10/08/2023);  Goal status: Not met  3.  Patient will improve 3 points on the Patient Specific Functional Scale to demonstrate improvement in patient's ability to participate in personally meaningful activities.  Baseline: To be tested at visit two as appropriate (09/15/23); 3.7 at visit #2 (09/23/2023);  Goal status: Not met  4.  Patient will report 50% improvement in her confidence in her ability to implement a flair-up plan to control pain to tolerable levels while remaining active to improve quality of life and functional participation.  Baseline: patient does not have a flair up plan (09/15/23); Goal status: not met  5.  Patient will demonstrate B LE strength to equal or greater than 4/5 with ability to consistently resist force to improve ability to navigate stairs, household and community mobility,  and engage in outdoor activities with less risk of falling and improved tolerance.  Baseline: as low as 3+/5 at L LE with inability to maintain resistance in most L LE motions (09/15/2023); not re-measured due to continued poor tolerance to prevent exacerbation of symptoms (10/08/2023);   Goal status: not met   PLAN:  PT FREQUENCY: 1-2x/week  PT DURATION: 12 weeks  PLANNED INTERVENTIONS: Therapeutic exercises, Therapeutic activity, Neuromuscular re-education, Balance training, Gait training, Patient/Family education, Self Care, Joint mobilization, Stair training, DME instructions, Aquatic Therapy, Dry Needling, Electrical stimulation, Spinal mobilization, Cryotherapy, Moist heat, Manual therapy, and Re-evaluation.  PLAN FOR NEXT SESSION: Patient is now discharged from PT.    Cira Rue, PT, DPT 10/08/2023, 3:18 PM  [facility] [address] [place], [place] [address] P: [phone] I F: [phone]

## 2023-10-12 ENCOUNTER — Encounter: Payer: Self-pay | Admitting: Internal Medicine

## 2023-10-12 ENCOUNTER — Ambulatory Visit: Payer: Medicare Other | Admitting: Physical Therapy

## 2023-10-12 DIAGNOSIS — Z1231 Encounter for screening mammogram for malignant neoplasm of breast: Secondary | ICD-10-CM

## 2023-10-13 ENCOUNTER — Other Ambulatory Visit: Payer: Self-pay | Admitting: Internal Medicine

## 2023-10-13 DIAGNOSIS — N6489 Other specified disorders of breast: Secondary | ICD-10-CM

## 2023-10-14 ENCOUNTER — Ambulatory Visit: Payer: Medicare Other | Admitting: Physical Therapy

## 2023-10-19 ENCOUNTER — Encounter: Payer: Medicare Other | Admitting: Physical Therapy

## 2023-10-21 ENCOUNTER — Encounter: Payer: Medicare Other | Admitting: Physical Therapy

## 2023-11-11 ENCOUNTER — Ambulatory Visit
Admission: RE | Admit: 2023-11-11 | Discharge: 2023-11-11 | Disposition: A | Discharge status: Home / Self Care | Payer: Medicare Other | Source: Ambulatory Visit | Attending: Internal Medicine | Admitting: Internal Medicine

## 2023-11-11 ENCOUNTER — Other Ambulatory Visit: Payer: Self-pay | Admitting: Internal Medicine

## 2023-11-11 DIAGNOSIS — N6320 Unspecified lump in the left breast, unspecified quadrant: Secondary | ICD-10-CM

## 2023-11-11 DIAGNOSIS — N6489 Other specified disorders of breast: Secondary | ICD-10-CM | POA: Insufficient documentation

## 2023-12-03 ENCOUNTER — Ambulatory Visit: Payer: Medicare Other | Admitting: Internal Medicine

## 2023-12-07 ENCOUNTER — Ambulatory Visit: Payer: Medicare Other | Admitting: Internal Medicine

## 2024-01-05 ENCOUNTER — Ambulatory Visit: Payer: Medicare Other | Admitting: Internal Medicine

## 2024-01-14 ENCOUNTER — Telehealth: Payer: Self-pay | Admitting: Internal Medicine

## 2024-01-14 NOTE — Telephone Encounter (Signed)
 Called  pt to get pt rescheduled please call the office back so we can get pt in for the soonest available appointment. Thanks.

## 2024-01-26 ENCOUNTER — Ambulatory Visit: Payer: Medicare Other | Admitting: Internal Medicine

## 2024-12-16 ENCOUNTER — Ambulatory Visit
Admission: RE | Admit: 2024-12-16 | Discharge: 2024-12-16 | Disposition: A | Discharge status: Home / Self Care | Source: Ambulatory Visit | Attending: Emergency Medicine | Admitting: Emergency Medicine

## 2024-12-16 VITALS — BP 130/80 | HR 103 | Temp 98.7°F | Resp 16 | Ht 68.0 in | Wt 235.0 lb

## 2024-12-16 DIAGNOSIS — J0140 Acute pansinusitis, unspecified: Secondary | ICD-10-CM | POA: Diagnosis not present

## 2024-12-16 DIAGNOSIS — J069 Acute upper respiratory infection, unspecified: Secondary | ICD-10-CM

## 2024-12-16 MED ORDER — AEROCHAMBER MV MISC: 2 refills | Status: AC

## 2024-12-16 MED ORDER — BENZONATATE 100 MG PO CAPS: 200.0000 mg | ORAL_CAPSULE | Freq: Three times a day (TID) | ORAL | 0 refills | Status: AC

## 2024-12-16 MED ORDER — IPRATROPIUM BROMIDE 0.06 % NA SOLN: 2.0000 | Freq: Four times a day (QID) | NASAL | 12 refills | Status: AC

## 2024-12-16 MED ORDER — DOXYCYCLINE HYCLATE 100 MG PO CAPS: 100.0000 mg | ORAL_CAPSULE | Freq: Two times a day (BID) | ORAL | 0 refills | Status: AC

## 2024-12-16 MED ORDER — CEFDINIR 300 MG PO CAPS: 300.0000 mg | ORAL_CAPSULE | Freq: Two times a day (BID) | ORAL | 0 refills | Status: DC

## 2024-12-16 MED ORDER — ALBUTEROL SULFATE HFA 108 (90 BASE) MCG/ACT IN AERS: 2.0000 | INHALATION_SPRAY | RESPIRATORY_TRACT | 0 refills | Status: AC | PRN

## 2024-12-16 MED ORDER — PROMETHAZINE-DM 6.25-15 MG/5ML PO SYRP: 5.0000 mL | ORAL_SOLUTION | Freq: Four times a day (QID) | ORAL | 0 refills | Status: AC | PRN

## 2024-12-16 NOTE — ED Triage Notes (Signed)
 Patient c/o cough and chest congestion going on for 2 weeks.  Patient denies recent fevers.  Patient reports some fatigue and bodyaches.

## 2024-12-16 NOTE — ED Provider Notes (Signed)
 " [facility]    CSN: [id number] Arrival date & time: 12/16/24  0807      History   Chief Complaint Chief Complaint  Patient presents with   Cough    Appointment    HPI Yolanda Young is a 59 y.o. female.   HPI  59 year old female with past medical history significant for exertional dyspnea, chronic bilateral low back pain, Barrett's esophagus, IBS, and neuropathy presents for evaluation of 2 weeks with the respiratory symptoms.  She reports that until 6 days ago she had been running a fever with a Tmax of 100.  She continues to experience runny nose, nasal congestion, a cough that is now, nonproductive, and some shortness of breath.  Intermittent fatigue and bodyaches as well.  She denies any ear pain, sore throat, or wheezing.  Past Medical History:  Diagnosis Date   Colon polyp 02/04/2018   tubular adenoma   GERD (gastroesophageal reflux disease)    History of hiatal hernia    Migraine    weekly   Neck pain    Panic attacks    Vertigo     Patient Active Problem List   Diagnosis Date Noted   Cervical myelopathy with cervical radiculopathy (HCC) 01/27/2023   Muscle weakness of lower extremity 01/27/2023   Numbness and tingling in both hands 01/27/2023   Impaired fasting glucose 01/27/2023   Elevated alkaline phosphatase measurement 09/24/2022   Fibromyalgia 09/23/2022   Chest pain 09/12/2022   Barrett's esophagus without dysplasia 07/06/2020   IBS (irritable bowel syndrome) 07/06/2020   Chronic 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 04/03/2020   Hepatic steatosis 04/03/2020   Neuropathy 03/22/2020   Excessive body weight gain 03/06/2020   Amenorrhea, secondary 03/06/2020   Exertional dyspnea 03/06/2020   Neck pain of over 3 months duration 03/06/2020   History of colonic polyps    Colon cancer screening    Polyp of sigmoid colon    Benign neoplasm of descending colon    Gastritis without bleeding     Past Surgical History:  Procedure  Laterality Date   ABLATION ON ENDOMETRIOSIS  2013   [place]   CHOLECYSTECTOMY N/A 04/05/2018   Procedure: LAPAROSCOPIC CHOLECYSTECTOMY WITH INTRAOPERATIVE CHOLANGIOGRAM;  Surgeon: Dessa Reyes ORN, MD;  Location: [facility];  Service: General;  Laterality: N/A;   COLONOSCOPY WITH PROPOFOL  N/A 02/04/2018   Procedure: COLONOSCOPY WITH PROPOFOL ;  Surgeon: Jinny Carmine, MD;  Location: [facility];  Service: Endoscopy;  Laterality: N/A;   COLONOSCOPY WITH PROPOFOL  N/A 03/09/2023   Procedure: COLONOSCOPY WITH PROPOFOL ;  Surgeon: Jinny Carmine, MD;  Location: [facility];  Service: Endoscopy;  Laterality: N/A;   ESOPHAGOGASTRODUODENOSCOPY (EGD) WITH PROPOFOL  N/A 02/04/2018   Procedure: ESOPHAGOGASTRODUODENOSCOPY (EGD) WITH PROPOFOL ;  Surgeon: Jinny Carmine, MD;  Location: [facility];  Service: Endoscopy;  Laterality: N/A;   ESOPHAGOGASTRODUODENOSCOPY (EGD) WITH PROPOFOL  N/A 07/03/2020   Procedure: ESOPHAGOGASTRODUODENOSCOPY (EGD) WITH PROPOFOL ;  Surgeon: Maryruth Ole DASEN, MD;  Location: [facility];  Service: Endoscopy;  Laterality: N/A;   SHOULDER SURGERY Right     OB History     Gravida  4   Para      Term      Preterm      AB  4   Living  0      SAB  4   IAB      Ectopic      Multiple      Live Births  Obstetric Comments  1st Menstrual Cycle:  12  1st Pregnancy:  17           Home Medications    Prior to Admission medications  Medication Sig Start Date End Date Taking? Authorizing Provider  albuterol  (VENTOLIN  HFA) 108 (90 Base) MCG/ACT inhaler Inhale 2 puffs into the lungs every 4 (four) hours as needed. 12/16/24  Yes Bernardino Ditch, NP  baclofen  (LIORESAL ) 10 MG tablet Take 1 tablet (10 mg total) by mouth 3 (three) times daily. 06/01/23  Yes Marylynn Verneita CROME, MD  benzonatate  (TESSALON ) 100 MG capsule Take 2 capsules (200 mg total) by mouth every 8 (eight) hours. 12/16/24  Yes Bernardino Ditch, NP  doxycycline  (VIBRAMYCIN ) 100 MG  capsule Take 1 capsule (100 mg total) by mouth 2 (two) times daily for 7 days. 12/16/24 12/23/24 Yes Bernardino Ditch, NP  ipratropium (ATROVENT ) 0.06 % nasal spray Place 2 sprays into both nostrils 4 (four) times daily. 12/16/24  Yes Bernardino Ditch, NP  promethazine -dextromethorphan (PROMETHAZINE -DM) 6.25-15 MG/5ML syrup Take 5 mLs by mouth 4 (four) times daily as needed. 12/16/24  Yes Bernardino Ditch, NP  Spacer/Aero-Holding Chambers (AEROCHAMBER MV) inhaler Use as instructed 12/16/24  Yes Bernardino Ditch, NP  ibuprofen (ADVIL,MOTRIN) 200 MG tablet Take 600 mg by mouth every 6 (six) hours as needed for moderate pain.    [provider]  ondansetron  (ZOFRAN  ODT) 4 MG disintegrating tablet Take 1 tablet (4 mg total) by mouth every 8 (eight) hours as needed for nausea or vomiting. 02/01/21   Skeet Juliene SAUNDERS, DO    Family History Family History  Problem Relation Age of Onset   Diabetes Mother    Stroke Father    Crohn's disease Father    Cancer Maternal Grandmother    Cancer Maternal Grandfather    Diabetes Paternal Grandmother    Breast cancer Neg Hx     Social History Social History[1]   Allergies   Azithromycin, Erythromycin, Penicillins, and Prednisone    Review of Systems Review of Systems  Constitutional:  Positive for fatigue and fever.  HENT:  Positive for congestion, postnasal drip, rhinorrhea and sinus pressure. Negative for ear pain and sore throat.   Respiratory:  Positive for cough and shortness of breath. Negative for wheezing.   Musculoskeletal:  Positive for arthralgias and myalgias.     Physical Exam Triage Vital Signs ED Triage Vitals  Encounter Vitals Group     BP      Girls Systolic BP Percentile      Girls Diastolic BP Percentile      Boys Systolic BP Percentile      Boys Diastolic BP Percentile      Pulse      Resp      Temp      Temp src      SpO2      Weight      Height      Head Circumference      Peak Flow      Pain Score      Pain Loc      Pain  Education      Exclude from Growth Chart    No data found.  Updated Vital Signs BP 130/80 (BP Location: Right Arm)   Pulse (!) 103   Temp 98.7 F (37.1 C) (Oral)   Resp 16   Ht 5' 8 (1.727 m)   Wt 235 lb (106.6 kg)   LMP 03/29/2013   SpO2 96%   BMI 35.73 kg/m  Visual Acuity Right Eye Distance:   Left Eye Distance:   Bilateral Distance:    Right Eye Near:   Left Eye Near:    Bilateral Near:     Physical Exam Vitals and nursing note reviewed.  Constitutional:      Appearance: Normal appearance. She is not ill-appearing.  HENT:     Head: Normocephalic and atraumatic.     Right Ear: Tympanic membrane, ear canal and external ear normal. There is no impacted cerumen.     Left Ear: Tympanic membrane, ear canal and external ear normal. There is no impacted cerumen.     Nose: Congestion and rhinorrhea present.     Comments: Please mucosa is erythematous and edematous with yellow discharge in both nares.  Bilateral frontal and maxillary sinuses are tender to compression.    Mouth/Throat:     Mouth: Mucous membranes are moist.     Pharynx: Oropharynx is clear. No oropharyngeal exudate or posterior oropharyngeal erythema.  Cardiovascular:     Rate and Rhythm: Normal rate and regular rhythm.     Pulses: Normal pulses.     Heart sounds: Normal heart sounds. No murmur heard.    No friction rub. No gallop.  Pulmonary:     Effort: Pulmonary effort is normal.     Breath sounds: No wheezing, rhonchi or rales.  Musculoskeletal:     Cervical back: Normal range of motion and neck supple. No tenderness.  Lymphadenopathy:     Cervical: No cervical adenopathy.  Skin:    General: Skin is warm and dry.     Capillary Refill: Capillary refill takes less than 2 seconds.     Findings: No rash.  Neurological:     General: No focal deficit present.     Mental Status: She is alert and oriented to person, place, and time.      UC Treatments / Results  Labs (all labs ordered are  listed, but only abnormal results are displayed) Labs Reviewed - No data to display  EKG   Radiology No results found.  Procedures Procedures (including critical care time)  Medications Ordered in UC Medications - No data to display  Initial Impression / Assessment and Plan / UC Course  I have reviewed the triage vital signs and the nursing notes.  Pertinent labs & imaging results that were available during my care of the patient were reviewed by me and considered in my medical decision making (see chart for details).   Patient is a pleasant, nontoxic-appearing 59 year old female presenting for evaluation of 2 which without respiratory symptoms.  She is endorsing shortness of breath though she is able to speak in full sentences without dyspnea or tachypnea.  Respiratory rate at triage was 16 with 96% room air oxygen saturation.  She is currently afebrile with an oral temp of 98.7.  She reports that overall she is feeling worse in regards to sinus pressure and nasal discharge.  She is continue to cough even though it has not become nonproductive she reports that she feels that is worse as well.  Her lungs are clear to auscultation and fields.  She does have inflammation of respiratory tract as well as yellow nasal discharge and sinus tenderness.  I will treat her for pansinusitis and URI with cough and congestion with a 7-day course of doxycycline  additionally, I will prescribe an albuterol  inhaler and spacer that she can use as needed for her shortness of breath along with Atrovent  nasal spray for the nasal congestion postnasal  drip.  Tessalon  Perles as needed cough syrup for cough and congestion.  Return precautions reviewed.  She denies need for work note.   Final Clinical Impressions(s) / UC Diagnoses   Final diagnoses:  URI with cough and congestion  Acute non-recurrent pansinusitis     Discharge Instructions      The Doxycycline  twice daily with food for 7 days for treatment of  your sinusitis.  Perform sinus irrigation 2-3 times a day with a [employer] sinus rinse kit and distilled water .  Do not use tap water .  You can use plain over-the-counter Mucinex  every 6 hours to break up the stickiness of the mucus so your body can clear it.  Increase your oral fluid intake to thin out your mucus so that is also able for your body to clear more easily.  Take an over-the-counter probiotic, such as Culturelle-align-activia, 1 hour after each dose of antibiotic to prevent diarrhea.  Use the Atrovent  nasal spray, 2 squirts in each nostril every 6 hours, as needed for runny nose and postnasal drip.  Use the Tessalon  Perles every 8 hours during the day.  Take them with a small sip of water .  They may give you some numbness to the base of your tongue or a metallic taste in your mouth, this is normal.  Use the Promethazine  DM cough syrup at bedtime for cough and congestion.  It will make you drowsy so do not take it during the day.  Return for reevaluation or see your primary care provider for any new or worsening symptoms.      ED Prescriptions     Medication Sig Dispense Auth. Provider   cefdinir  (OMNICEF ) 300 MG capsule  (Status: Discontinued) Take 1 capsule (300 mg total) by mouth 2 (two) times daily for 7 days. 14 capsule Bernardino Ditch, NP   Spacer/Aero-Holding Chambers (AEROCHAMBER MV) inhaler Use as instructed 1 each Bernardino Ditch, NP   albuterol  (VENTOLIN  HFA) 108 (90 Base) MCG/ACT inhaler Inhale 2 puffs into the lungs every 4 (four) hours as needed. 18 g Bernardino Ditch, NP   benzonatate  (TESSALON ) 100 MG capsule Take 2 capsules (200 mg total) by mouth every 8 (eight) hours. 21 capsule Bernardino Ditch, NP   ipratropium (ATROVENT ) 0.06 % nasal spray Place 2 sprays into both nostrils 4 (four) times daily. 15 mL Bernardino Ditch, NP   promethazine -dextromethorphan (PROMETHAZINE -DM) 6.25-15 MG/5ML syrup Take 5 mLs by mouth 4 (four) times daily as needed. 118 mL Bernardino Ditch, NP    doxycycline  (VIBRAMYCIN ) 100 MG capsule Take 1 capsule (100 mg total) by mouth 2 (two) times daily for 7 days. 14 capsule Bernardino Ditch, NP      PDMP not reviewed this encounter.    [1]  Social History Tobacco Use   Smoking status: Former    Current packs/day: 0.00    Average packs/day: 0.5 packs/day for 2.0 years (1.0 ttl pk-yrs)    Types: Cigarettes    Start date: 65    Quit date: 1999    Years since quitting: 27.0   Smokeless tobacco: Never  Vaping Use   Vaping status: Never Used  Substance Use Topics   Alcohol use: Not Currently    Alcohol/week: 4.0 standard drinks of alcohol    Types: 4 Glasses of wine per week   Drug use: No     Bernardino Ditch, NP 12/16/24 [phone]  "

## 2024-12-16 NOTE — Discharge Instructions (Addendum)
 The Doxycycline  twice daily with food for 7 days for treatment of your sinusitis.  Perform sinus irrigation 2-3 times a day with a [employer] sinus rinse kit and distilled water .  Do not use tap water .  You can use plain over-the-counter Mucinex  every 6 hours to break up the stickiness of the mucus so your body can clear it.  Increase your oral fluid intake to thin out your mucus so that is also able for your body to clear more easily.  Take an over-the-counter probiotic, such as Culturelle-align-activia, 1 hour after each dose of antibiotic to prevent diarrhea.  Use the Atrovent  nasal spray, 2 squirts in each nostril every 6 hours, as needed for runny nose and postnasal drip.  Use the Tessalon  Perles every 8 hours during the day.  Take them with a small sip of water .  They may give you some numbness to the base of your tongue or a metallic taste in your mouth, this is normal.  Use the Promethazine  DM cough syrup at bedtime for cough and congestion.  It will make you drowsy so do not take it during the day.  Return for reevaluation or see your primary care provider for any new or worsening symptoms.
